# Patient Record
Sex: Female | Born: 1945 | Race: Asian | Hispanic: No | Marital: Married | Smoking: Never smoker
Health system: Southern US, Community
[De-identification: ages and names within clinical notes are randomized; demographics above are authoritative.]

## PROBLEM LIST (undated history)

## (undated) DIAGNOSIS — E876 Hypokalemia: Secondary | ICD-10-CM

## (undated) DIAGNOSIS — I351 Nonrheumatic aortic (valve) insufficiency: Secondary | ICD-10-CM

## (undated) DIAGNOSIS — I10 Essential (primary) hypertension: Secondary | ICD-10-CM

## (undated) DIAGNOSIS — E785 Hyperlipidemia, unspecified: Secondary | ICD-10-CM

## (undated) DIAGNOSIS — I272 Pulmonary hypertension, unspecified: Secondary | ICD-10-CM

## (undated) DIAGNOSIS — I340 Nonrheumatic mitral (valve) insufficiency: Secondary | ICD-10-CM

## (undated) DIAGNOSIS — R112 Nausea with vomiting, unspecified: Secondary | ICD-10-CM

## (undated) DIAGNOSIS — R197 Diarrhea, unspecified: Secondary | ICD-10-CM

## (undated) DIAGNOSIS — R55 Syncope and collapse: Secondary | ICD-10-CM

## (undated) DIAGNOSIS — I4891 Unspecified atrial fibrillation: Secondary | ICD-10-CM

## (undated) HISTORY — DX: Unspecified atrial fibrillation: I48.91

## (undated) HISTORY — DX: Pulmonary hypertension, unspecified: I27.20

## (undated) HISTORY — DX: Syncope and collapse: R55

## (undated) HISTORY — DX: Nonrheumatic mitral (valve) insufficiency: I34.0

## (undated) HISTORY — DX: Essential (primary) hypertension: I10

## (undated) HISTORY — DX: Nausea with vomiting, unspecified: R11.2

## (undated) HISTORY — DX: Nonrheumatic aortic (valve) insufficiency: I35.1

## (undated) HISTORY — DX: Diarrhea, unspecified: R19.7

## (undated) HISTORY — DX: Hypokalemia: E87.6

---

## 2002-01-22 ENCOUNTER — Other Ambulatory Visit: Admission: RE | Admit: 2002-01-22 | Discharge: 2002-01-22 | Payer: Self-pay | Admitting: Obstetrics and Gynecology

## 2011-07-15 DIAGNOSIS — J45909 Unspecified asthma, uncomplicated: Secondary | ICD-10-CM | POA: Diagnosis not present

## 2011-07-15 DIAGNOSIS — I10 Essential (primary) hypertension: Secondary | ICD-10-CM | POA: Diagnosis not present

## 2011-07-15 DIAGNOSIS — R7309 Other abnormal glucose: Secondary | ICD-10-CM | POA: Diagnosis not present

## 2011-07-15 DIAGNOSIS — E7800 Pure hypercholesterolemia, unspecified: Secondary | ICD-10-CM | POA: Diagnosis not present

## 2012-01-06 DIAGNOSIS — Z1231 Encounter for screening mammogram for malignant neoplasm of breast: Secondary | ICD-10-CM | POA: Diagnosis not present

## 2012-01-20 DIAGNOSIS — R928 Other abnormal and inconclusive findings on diagnostic imaging of breast: Secondary | ICD-10-CM | POA: Diagnosis not present

## 2012-02-24 DIAGNOSIS — I10 Essential (primary) hypertension: Secondary | ICD-10-CM | POA: Diagnosis not present

## 2012-02-24 DIAGNOSIS — E785 Hyperlipidemia, unspecified: Secondary | ICD-10-CM | POA: Diagnosis not present

## 2012-02-24 DIAGNOSIS — N390 Urinary tract infection, site not specified: Secondary | ICD-10-CM | POA: Diagnosis not present

## 2012-02-24 DIAGNOSIS — R7301 Impaired fasting glucose: Secondary | ICD-10-CM | POA: Diagnosis not present

## 2012-03-04 DIAGNOSIS — Z23 Encounter for immunization: Secondary | ICD-10-CM | POA: Diagnosis not present

## 2012-03-04 DIAGNOSIS — J45909 Unspecified asthma, uncomplicated: Secondary | ICD-10-CM | POA: Diagnosis not present

## 2012-03-04 DIAGNOSIS — M5430 Sciatica, unspecified side: Secondary | ICD-10-CM | POA: Diagnosis not present

## 2012-03-04 DIAGNOSIS — E785 Hyperlipidemia, unspecified: Secondary | ICD-10-CM | POA: Diagnosis not present

## 2012-03-04 DIAGNOSIS — R7301 Impaired fasting glucose: Secondary | ICD-10-CM | POA: Diagnosis not present

## 2012-03-04 DIAGNOSIS — I10 Essential (primary) hypertension: Secondary | ICD-10-CM | POA: Diagnosis not present

## 2012-05-04 DIAGNOSIS — J069 Acute upper respiratory infection, unspecified: Secondary | ICD-10-CM | POA: Diagnosis not present

## 2012-06-19 DIAGNOSIS — E7800 Pure hypercholesterolemia, unspecified: Secondary | ICD-10-CM | POA: Diagnosis not present

## 2012-06-19 DIAGNOSIS — R7301 Impaired fasting glucose: Secondary | ICD-10-CM | POA: Diagnosis not present

## 2012-06-19 DIAGNOSIS — I10 Essential (primary) hypertension: Secondary | ICD-10-CM | POA: Diagnosis not present

## 2012-06-28 DIAGNOSIS — E785 Hyperlipidemia, unspecified: Secondary | ICD-10-CM | POA: Diagnosis not present

## 2012-06-28 DIAGNOSIS — I10 Essential (primary) hypertension: Secondary | ICD-10-CM | POA: Diagnosis not present

## 2012-06-28 DIAGNOSIS — J45909 Unspecified asthma, uncomplicated: Secondary | ICD-10-CM | POA: Diagnosis not present

## 2012-06-28 DIAGNOSIS — M5430 Sciatica, unspecified side: Secondary | ICD-10-CM | POA: Diagnosis not present

## 2012-10-05 DIAGNOSIS — E7800 Pure hypercholesterolemia, unspecified: Secondary | ICD-10-CM | POA: Diagnosis not present

## 2012-10-05 DIAGNOSIS — E119 Type 2 diabetes mellitus without complications: Secondary | ICD-10-CM | POA: Diagnosis not present

## 2012-10-05 DIAGNOSIS — I10 Essential (primary) hypertension: Secondary | ICD-10-CM | POA: Diagnosis not present

## 2012-10-15 DIAGNOSIS — M25569 Pain in unspecified knee: Secondary | ICD-10-CM | POA: Diagnosis not present

## 2012-10-15 DIAGNOSIS — M1710 Unilateral primary osteoarthritis, unspecified knee: Secondary | ICD-10-CM | POA: Diagnosis not present

## 2012-10-15 DIAGNOSIS — M5430 Sciatica, unspecified side: Secondary | ICD-10-CM | POA: Diagnosis not present

## 2012-10-15 DIAGNOSIS — J45909 Unspecified asthma, uncomplicated: Secondary | ICD-10-CM | POA: Diagnosis not present

## 2012-10-15 DIAGNOSIS — I10 Essential (primary) hypertension: Secondary | ICD-10-CM | POA: Diagnosis not present

## 2012-10-15 DIAGNOSIS — E785 Hyperlipidemia, unspecified: Secondary | ICD-10-CM | POA: Diagnosis not present

## 2012-10-15 DIAGNOSIS — IMO0002 Reserved for concepts with insufficient information to code with codable children: Secondary | ICD-10-CM | POA: Diagnosis not present

## 2012-12-28 DIAGNOSIS — H2510 Age-related nuclear cataract, unspecified eye: Secondary | ICD-10-CM | POA: Diagnosis not present

## 2013-02-12 DIAGNOSIS — Z23 Encounter for immunization: Secondary | ICD-10-CM | POA: Diagnosis not present

## 2013-07-25 DIAGNOSIS — R7301 Impaired fasting glucose: Secondary | ICD-10-CM | POA: Diagnosis not present

## 2013-07-25 DIAGNOSIS — M545 Low back pain, unspecified: Secondary | ICD-10-CM | POA: Diagnosis not present

## 2013-07-25 DIAGNOSIS — Z0000 Encounter for general adult medical examination without abnormal findings: Secondary | ICD-10-CM | POA: Diagnosis not present

## 2013-07-25 DIAGNOSIS — I10 Essential (primary) hypertension: Secondary | ICD-10-CM | POA: Diagnosis not present

## 2013-07-25 DIAGNOSIS — E785 Hyperlipidemia, unspecified: Secondary | ICD-10-CM | POA: Diagnosis not present

## 2013-07-25 DIAGNOSIS — Z23 Encounter for immunization: Secondary | ICD-10-CM | POA: Diagnosis not present

## 2013-07-25 DIAGNOSIS — J45909 Unspecified asthma, uncomplicated: Secondary | ICD-10-CM | POA: Diagnosis not present

## 2013-08-14 ENCOUNTER — Other Ambulatory Visit: Payer: Self-pay

## 2013-08-14 DIAGNOSIS — Z1231 Encounter for screening mammogram for malignant neoplasm of breast: Secondary | ICD-10-CM

## 2013-09-02 ENCOUNTER — Other Ambulatory Visit (HOSPITAL_COMMUNITY)
Admission: RE | Admit: 2013-09-02 | Discharge: 2013-09-02 | Disposition: A | Discharge status: Home / Self Care | Payer: Medicare Other | Source: Ambulatory Visit | Attending: Obstetrics and Gynecology | Admitting: Obstetrics and Gynecology

## 2013-09-02 ENCOUNTER — Other Ambulatory Visit: Payer: Self-pay | Admitting: Nurse Practitioner

## 2013-09-02 DIAGNOSIS — Z124 Encounter for screening for malignant neoplasm of cervix: Secondary | ICD-10-CM | POA: Insufficient documentation

## 2013-09-02 DIAGNOSIS — Z01419 Encounter for gynecological examination (general) (routine) without abnormal findings: Secondary | ICD-10-CM | POA: Diagnosis not present

## 2013-09-07 DIAGNOSIS — Z124 Encounter for screening for malignant neoplasm of cervix: Secondary | ICD-10-CM | POA: Diagnosis not present

## 2013-09-22 ENCOUNTER — Ambulatory Visit
Admission: RE | Admit: 2013-09-22 | Discharge: 2013-09-22 | Disposition: A | Discharge status: Home / Self Care | Payer: Medicare Other | Source: Ambulatory Visit

## 2013-09-22 ENCOUNTER — Encounter (INDEPENDENT_AMBULATORY_CARE_PROVIDER_SITE_OTHER): Payer: Self-pay

## 2013-09-22 DIAGNOSIS — Z1231 Encounter for screening mammogram for malignant neoplasm of breast: Secondary | ICD-10-CM | POA: Diagnosis not present

## 2013-11-04 DIAGNOSIS — E785 Hyperlipidemia, unspecified: Secondary | ICD-10-CM | POA: Diagnosis not present

## 2013-11-04 DIAGNOSIS — J45909 Unspecified asthma, uncomplicated: Secondary | ICD-10-CM | POA: Diagnosis not present

## 2013-11-04 DIAGNOSIS — R7309 Other abnormal glucose: Secondary | ICD-10-CM | POA: Diagnosis not present

## 2013-11-04 DIAGNOSIS — Z1211 Encounter for screening for malignant neoplasm of colon: Secondary | ICD-10-CM | POA: Diagnosis not present

## 2013-11-04 DIAGNOSIS — Z23 Encounter for immunization: Secondary | ICD-10-CM | POA: Diagnosis not present

## 2013-11-04 DIAGNOSIS — I10 Essential (primary) hypertension: Secondary | ICD-10-CM | POA: Diagnosis not present

## 2013-12-01 DIAGNOSIS — Z1211 Encounter for screening for malignant neoplasm of colon: Secondary | ICD-10-CM | POA: Diagnosis not present

## 2014-02-14 DIAGNOSIS — I10 Essential (primary) hypertension: Secondary | ICD-10-CM | POA: Diagnosis not present

## 2014-02-14 DIAGNOSIS — R000 Tachycardia, unspecified: Secondary | ICD-10-CM | POA: Diagnosis not present

## 2014-04-03 DIAGNOSIS — R7301 Impaired fasting glucose: Secondary | ICD-10-CM | POA: Diagnosis not present

## 2014-04-03 DIAGNOSIS — Z23 Encounter for immunization: Secondary | ICD-10-CM | POA: Diagnosis not present

## 2014-04-03 DIAGNOSIS — J45909 Unspecified asthma, uncomplicated: Secondary | ICD-10-CM | POA: Diagnosis not present

## 2014-04-03 DIAGNOSIS — I10 Essential (primary) hypertension: Secondary | ICD-10-CM | POA: Diagnosis not present

## 2014-04-03 DIAGNOSIS — E785 Hyperlipidemia, unspecified: Secondary | ICD-10-CM | POA: Diagnosis not present

## 2014-04-17 DIAGNOSIS — I10 Essential (primary) hypertension: Secondary | ICD-10-CM | POA: Diagnosis not present

## 2014-07-03 DIAGNOSIS — M179 Osteoarthritis of knee, unspecified: Secondary | ICD-10-CM | POA: Diagnosis not present

## 2014-07-03 DIAGNOSIS — I10 Essential (primary) hypertension: Secondary | ICD-10-CM | POA: Diagnosis not present

## 2016-12-08 ENCOUNTER — Other Ambulatory Visit: Payer: Self-pay | Admitting: Family Medicine

## 2016-12-08 DIAGNOSIS — R740 Nonspecific elevation of levels of transaminase and lactic acid dehydrogenase [LDH]: Principal | ICD-10-CM

## 2016-12-08 DIAGNOSIS — R7401 Elevation of levels of liver transaminase levels: Secondary | ICD-10-CM

## 2016-12-20 ENCOUNTER — Ambulatory Visit
Admission: RE | Admit: 2016-12-20 | Discharge: 2016-12-20 | Disposition: A | Discharge status: Home / Self Care | Payer: Medicare Other | Source: Ambulatory Visit | Attending: Family Medicine | Admitting: Family Medicine

## 2016-12-20 DIAGNOSIS — R740 Nonspecific elevation of levels of transaminase and lactic acid dehydrogenase [LDH]: Principal | ICD-10-CM

## 2016-12-20 DIAGNOSIS — R7401 Elevation of levels of liver transaminase levels: Secondary | ICD-10-CM

## 2017-07-11 ENCOUNTER — Emergency Department (HOSPITAL_COMMUNITY): Payer: Medicare Other

## 2017-07-11 ENCOUNTER — Other Ambulatory Visit: Payer: Self-pay

## 2017-07-11 ENCOUNTER — Observation Stay (HOSPITAL_COMMUNITY)
Admission: EM | Admit: 2017-07-11 | Discharge: 2017-07-12 | Disposition: A | Discharge status: Home / Self Care | Payer: Medicare Other | Attending: Internal Medicine | Admitting: Internal Medicine

## 2017-07-11 ENCOUNTER — Observation Stay (HOSPITAL_COMMUNITY): Payer: Medicare Other

## 2017-07-11 ENCOUNTER — Encounter (HOSPITAL_COMMUNITY): Payer: Self-pay | Admitting: *Deleted

## 2017-07-11 DIAGNOSIS — I471 Supraventricular tachycardia: Secondary | ICD-10-CM | POA: Diagnosis not present

## 2017-07-11 DIAGNOSIS — I4891 Unspecified atrial fibrillation: Secondary | ICD-10-CM

## 2017-07-11 DIAGNOSIS — E785 Hyperlipidemia, unspecified: Secondary | ICD-10-CM | POA: Insufficient documentation

## 2017-07-11 DIAGNOSIS — E876 Hypokalemia: Secondary | ICD-10-CM | POA: Diagnosis present

## 2017-07-11 DIAGNOSIS — I10 Essential (primary) hypertension: Secondary | ICD-10-CM | POA: Diagnosis not present

## 2017-07-11 DIAGNOSIS — I081 Rheumatic disorders of both mitral and tricuspid valves: Secondary | ICD-10-CM | POA: Diagnosis not present

## 2017-07-11 DIAGNOSIS — M6281 Muscle weakness (generalized): Secondary | ICD-10-CM | POA: Insufficient documentation

## 2017-07-11 DIAGNOSIS — Z7982 Long term (current) use of aspirin: Secondary | ICD-10-CM | POA: Insufficient documentation

## 2017-07-11 DIAGNOSIS — I480 Paroxysmal atrial fibrillation: Secondary | ICD-10-CM | POA: Diagnosis not present

## 2017-07-11 DIAGNOSIS — Z79899 Other long term (current) drug therapy: Secondary | ICD-10-CM | POA: Diagnosis not present

## 2017-07-11 DIAGNOSIS — D696 Thrombocytopenia, unspecified: Secondary | ICD-10-CM | POA: Insufficient documentation

## 2017-07-11 DIAGNOSIS — R112 Nausea with vomiting, unspecified: Secondary | ICD-10-CM | POA: Insufficient documentation

## 2017-07-11 DIAGNOSIS — R531 Weakness: Secondary | ICD-10-CM | POA: Diagnosis not present

## 2017-07-11 DIAGNOSIS — R197 Diarrhea, unspecified: Secondary | ICD-10-CM | POA: Diagnosis not present

## 2017-07-11 DIAGNOSIS — R7989 Other specified abnormal findings of blood chemistry: Secondary | ICD-10-CM | POA: Insufficient documentation

## 2017-07-11 DIAGNOSIS — R55 Syncope and collapse: Secondary | ICD-10-CM

## 2017-07-11 HISTORY — DX: Essential (primary) hypertension: I10

## 2017-07-11 HISTORY — DX: Syncope and collapse: R55

## 2017-07-11 HISTORY — DX: Hyperlipidemia, unspecified: E78.5

## 2017-07-11 HISTORY — DX: Unspecified atrial fibrillation: I48.91

## 2017-07-11 HISTORY — DX: Paroxysmal atrial fibrillation: I48.0

## 2017-07-11 HISTORY — DX: Nausea with vomiting, unspecified: R11.2

## 2017-07-11 HISTORY — DX: Hypokalemia: E87.6

## 2017-07-11 LAB — BASIC METABOLIC PANEL
Anion gap: 12 (ref 5–15)
Anion gap: 5 (ref 5–15)
BUN: 18 mg/dL (ref 6–20)
BUN: 9 mg/dL (ref 6–20)
CALCIUM: 9.2 mg/dL (ref 8.9–10.3)
CHLORIDE: 112 mmol/L — AB (ref 101–111)
CO2: 27 mmol/L (ref 22–32)
CO2: 26 mmol/L (ref 22–32)
CREATININE: 0.69 mg/dL (ref 0.44–1.00)
Calcium: 8.3 mg/dL — ABNORMAL LOW (ref 8.9–10.3)
Chloride: 100 mmol/L — ABNORMAL LOW (ref 101–111)
Creatinine, Ser: 0.63 mg/dL (ref 0.44–1.00)
GFR calc Af Amer: >60 mL/min (ref >60)
GFR calc non Af Amer: >60 mL/min (ref >60)
GLUCOSE: 168 mg/dL — AB (ref 65–99)
Glucose, Bld: 113 mg/dL — ABNORMAL HIGH (ref 65–99)
POTASSIUM: 3.6 mmol/L (ref 3.5–5.1)
Potassium: 2.7 mmol/L — CL (ref 3.5–5.1)
SODIUM: 143 mmol/L (ref 135–145)
Sodium: 139 mmol/L (ref 135–145)

## 2017-07-11 LAB — CBC
HCT: 43.1 % (ref 36.0–46.0)
HEMOGLOBIN: 14.4 g/dL (ref 12.0–15.0)
MCH: 30.7 pg (ref 26.0–34.0)
MCHC: 33.4 g/dL (ref 30.0–36.0)
MCV: 91.9 fL (ref 78.0–100.0)
PLATELETS: 165 10*3/uL (ref 150–400)
RBC: 4.69 MIL/uL (ref 3.87–5.11)
RDW: 13.7 % (ref 11.5–15.5)
WBC: 14.9 10*3/uL — ABNORMAL HIGH (ref 4.0–10.5)

## 2017-07-11 LAB — URINALYSIS, ROUTINE W REFLEX MICROSCOPIC
BILIRUBIN URINE: NEGATIVE
GLUCOSE, UA: NEGATIVE mg/dL
HGB URINE DIPSTICK: NEGATIVE
KETONES UR: 5 mg/dL — AB
Leukocytes, UA: NEGATIVE
Nitrite: NEGATIVE
PH: 8 (ref 5.0–8.0)
PROTEIN: NEGATIVE mg/dL
Specific Gravity, Urine: 1.012 (ref 1.005–1.030)

## 2017-07-11 LAB — TROPONIN I
Troponin I: 0.03 ng/mL (ref <0.03)
Troponin I: 0.03 ng/mL (ref <0.03)

## 2017-07-11 LAB — RAPID URINE DRUG SCREEN, HOSP PERFORMED
AMPHETAMINES: NOT DETECTED
BARBITURATES: NOT DETECTED
Benzodiazepines: NOT DETECTED
Cocaine: NOT DETECTED
Opiates: NOT DETECTED
TETRAHYDROCANNABINOL: NOT DETECTED

## 2017-07-11 LAB — I-STAT TROPONIN, ED: TROPONIN I, POC: 0 ng/mL (ref 0.00–0.08)

## 2017-07-11 LAB — PROTIME-INR
INR: 0.93
PROTHROMBIN TIME: 12.4 s (ref 11.4–15.2)

## 2017-07-11 LAB — ECHOCARDIOGRAM COMPLETE
Height: 57 in
Weight: 2208 oz

## 2017-07-11 LAB — ETHANOL: Alcohol, Ethyl (B): <10 mg/dL (ref <10)

## 2017-07-11 LAB — D-DIMER, QUANTITATIVE: D-Dimer, Quant: 0.47 ug/mL-FEU (ref 0.00–0.50)

## 2017-07-11 LAB — TSH: TSH: 3.341 u[IU]/mL (ref 0.350–4.500)

## 2017-07-11 LAB — MAGNESIUM: Magnesium: 1.8 mg/dL (ref 1.7–2.4)

## 2017-07-11 LAB — LIPASE, BLOOD: LIPASE: 35 U/L (ref 11–51)

## 2017-07-11 MED ORDER — MAGNESIUM SULFATE IN D5W 1-5 GM/100ML-% IV SOLN
1.0000 g | Freq: Once | INTRAVENOUS | Status: AC
  Administered 2017-07-11: 1 g via INTRAVENOUS
  Filled 2017-07-11: qty 100

## 2017-07-11 MED ORDER — ACETAMINOPHEN 650 MG RE SUPP: 650.0000 mg | Freq: Four times a day (QID) | RECTAL | Status: DC | PRN

## 2017-07-11 MED ORDER — POLYVINYL ALCOHOL 1.4 % OP SOLN
1.0000 [drp] | Freq: Three times a day (TID) | OPHTHALMIC | Status: DC | PRN
  Filled 2017-07-11: qty 15

## 2017-07-11 MED ORDER — LORATADINE 10 MG PO TABS
10.0000 mg | ORAL_TABLET | Freq: Every day | ORAL | Status: DC
  Administered 2017-07-11 – 2017-07-12 (×2): 10 mg via ORAL
  Filled 2017-07-11 (×2): qty 1

## 2017-07-11 MED ORDER — POTASSIUM CHLORIDE 10 MEQ/100ML IV SOLN
10.0000 meq | Freq: Once | INTRAVENOUS | Status: AC
  Administered 2017-07-11: 10 meq via INTRAVENOUS
  Filled 2017-07-11: qty 100

## 2017-07-11 MED ORDER — ONDANSETRON HCL 4 MG/2ML IJ SOLN
4.0000 mg | Freq: Once | INTRAMUSCULAR | Status: AC | PRN
  Administered 2017-07-11: 4 mg via INTRAVENOUS
  Filled 2017-07-11: qty 2

## 2017-07-11 MED ORDER — VITAMIN B-1 100 MG PO TABS
50.0000 mg | ORAL_TABLET | Freq: Every day | ORAL | Status: DC
  Administered 2017-07-11 – 2017-07-12 (×2): 50 mg via ORAL
  Filled 2017-07-11 (×2): qty 1

## 2017-07-11 MED ORDER — ENOXAPARIN SODIUM 40 MG/0.4ML ~~LOC~~ SOLN
40.0000 mg | SUBCUTANEOUS | Status: DC
  Administered 2017-07-11: 40 mg via SUBCUTANEOUS
  Filled 2017-07-11 (×2): qty 0.4

## 2017-07-11 MED ORDER — DILTIAZEM HCL ER COATED BEADS 240 MG PO CP24
240.0000 mg | ORAL_CAPSULE | Freq: Every day | ORAL | Status: DC
  Administered 2017-07-11 – 2017-07-12 (×2): 240 mg via ORAL
  Filled 2017-07-11 (×2): qty 1

## 2017-07-11 MED ORDER — ASPIRIN EC 81 MG PO TBEC
81.0000 mg | DELAYED_RELEASE_TABLET | Freq: Every day | ORAL | Status: DC
  Administered 2017-07-11 – 2017-07-12 (×2): 81 mg via ORAL
  Filled 2017-07-11 (×2): qty 1

## 2017-07-11 MED ORDER — PRAVASTATIN SODIUM 20 MG PO TABS
20.0000 mg | ORAL_TABLET | Freq: Every day | ORAL | Status: DC
  Administered 2017-07-11 – 2017-07-12 (×2): 20 mg via ORAL
  Filled 2017-07-11 (×2): qty 1

## 2017-07-11 MED ORDER — ONDANSETRON HCL 4 MG PO TABS
4.0000 mg | ORAL_TABLET | Freq: Four times a day (QID) | ORAL | Status: DC | PRN
Start: 2017-07-11 — End: 2017-07-12

## 2017-07-11 MED ORDER — ONDANSETRON HCL 4 MG/2ML IJ SOLN: 4.0000 mg | Freq: Four times a day (QID) | INTRAMUSCULAR | Status: DC | PRN

## 2017-07-11 MED ORDER — POTASSIUM CHLORIDE 10 MEQ/100ML IV SOLN
10.0000 meq | INTRAVENOUS | Status: AC
  Administered 2017-07-11 (×3): 10 meq via INTRAVENOUS
  Filled 2017-07-11 (×3): qty 100

## 2017-07-11 MED ORDER — DILTIAZEM HCL 60 MG PO TABS
120.0000 mg | ORAL_TABLET | Freq: Once | ORAL | Status: AC
  Administered 2017-07-11: 120 mg via ORAL
  Filled 2017-07-11: qty 2

## 2017-07-11 MED ORDER — POTASSIUM CHLORIDE CRYS ER 20 MEQ PO TBCR
40.0000 meq | EXTENDED_RELEASE_TABLET | Freq: Once | ORAL | Status: AC
  Administered 2017-07-11: 40 meq via ORAL
  Filled 2017-07-11: qty 2

## 2017-07-11 MED ORDER — ALBUTEROL SULFATE (2.5 MG/3ML) 0.083% IN NEBU: 2.5000 mg | INHALATION_SOLUTION | Freq: Four times a day (QID) | RESPIRATORY_TRACT | Status: DC | PRN

## 2017-07-11 MED ORDER — ACETAMINOPHEN 325 MG PO TABS: 650.0000 mg | ORAL_TABLET | Freq: Four times a day (QID) | ORAL | Status: DC | PRN

## 2017-07-11 NOTE — ED Notes (Signed)
ED Provider at bedside. 

## 2017-07-11 NOTE — ED Notes (Signed)
Patient will be transported upstairs once ECHO is complete.

## 2017-07-11 NOTE — ED Notes (Signed)
Report given to Abby,RN [phone].

## 2017-07-11 NOTE — H&P (Signed)
History and Physical    Rita Rodona Besancon WUJ:[id number] DOB: Nov 06, 1945 DOA: 07/11/2017  Referring MD/NP/PA: Dr. Zadie Rhineonald Wickline PCP: Darrow BussingKoirala, Dibas, MD  Patient coming from: home  Chief Complaint: Syncopal episode  I have personally briefly reviewed patient's old medical records in [facility]   HPI: Rita Myers is a 72 y.o. female with medical history significant of HTN, HLD, and reported history of irregular heartbeat on aspirin; who presents after having syncopal episode at home.  Patient husband witnessed the event and notes that she had passed out, but he is unsure if she hit her head.  He reports that she only lost consciousness for a few seconds.  She gone out to eat at a restaurant and late evening around 9:30 to 10 PM last night had acute onset of nausea, multiple episodes of vomiting, and large loose stool.  Thereafter patient reported feeling dizzy as though the room was spinning around her and very weak just prior to having the syncopal episode.  Denies having any chest pain, recent travel, calf pain, cough, history of TIA/stroke,.  Associated symptoms include palpitations and intermittent shortness of breath.  She reports history of having a irregular heart rhythm for which she takes a daily aspirin, and had a echocardiogram done over a year ago..    ED Course: Upon admission into the emergency department patient was found to be afebrile, pulse 62-147, respirations 18, blood pressure 110/95-160 7/92, O2 saturations 100% on room air.  Labs revealed WBC 14.9, potassium 2.7, and initial troponin 0.  CT scan of the brain and chest x-ray was negative for any acute abnormalities.  Urine drug screen and alcohol levels were unremarkable.  Urinalysis was negative for any acute abnormalities.She was given 10 mg of potassium chloride IV and 40 mEq p.o EKG revealed patient in atrial fibrillation with intermittent RVR.  Later given 125 mg of Cardizem p.o.  TRH called to admit.    Review of Systems   Constitutional: Negative for chills, diaphoresis and fever.  HENT: Negative for ear discharge and nosebleeds.   Eyes: Negative for photophobia and discharge.  Respiratory: Positive for shortness of breath. Negative for cough and wheezing.   Cardiovascular: Positive for palpitations. Negative for chest pain and leg swelling.  Gastrointestinal: Positive for diarrhea, nausea and vomiting. Negative for abdominal pain.  Genitourinary: Negative for dysuria and hematuria.  Musculoskeletal: Positive for falls.  Skin: Negative for itching and rash.  Neurological: Positive for dizziness, loss of consciousness and weakness.  Psychiatric/Behavioral: Negative for substance abuse and suicidal ideas.    Past Medical History:  Diagnosis Date  . Hyperlipemia   . Hypertension     History reviewed. No pertinent surgical history.   reports that  has never smoked. she has never used smokeless tobacco. She reports that she does not drink alcohol or use drugs.  No Known Allergies  No known family history of blood clotting disorder  Prior to Admission medications   Medication Sig Start Date End Date Taking? Authorizing Provider  aspirin EC 81 MG tablet Take 81 mg by mouth daily.   Yes [provider]  calcium citrate-vitamin D (CITRACAL+D) 315-200 MG-UNIT tablet Take 1 tablet by mouth 2 (two) times daily.   Yes [provider]  Cholecalciferol (VITAMIN D PO) Take 1 capsule by mouth daily.   Yes [provider]  diltiazem (DILACOR XR) 240 MG 24 hr capsule Take 240 mg by mouth daily.   Yes [provider]  hydrochlorothiazide (HYDRODIURIL) 25 MG tablet Take  25 mg by mouth daily.   Yes [provider]  hydroxypropyl methylcellulose / hypromellose (ISOPTO TEARS / GONIOVISC) 2.5 % ophthalmic solution Place 1 drop into both eyes 3 (three) times daily as needed for dry eyes.   Yes [provider]  loratadine (CLARITIN) 10 MG tablet Take 10 mg by mouth  daily.   Yes [provider]  Omega-3 Fatty Acids (FISH OIL) 1000 MG CAPS Take 1,000 mg by mouth daily.   Yes [provider]  pravastatin (PRAVACHOL) 20 MG tablet Take 20 mg by mouth daily.   Yes [provider]  thiamine (VITAMIN B-1) 50 MG tablet Take 50 mg by mouth daily.   Yes [provider]    Physical Exam:  Constitutional: Elderly female in NAD, calm, comfortable Vitals:   07/11/17 0313 07/11/17 0314 07/11/17 0315 07/11/17 0321  BP: 118/70 (!) 110/95 119/88   Pulse:   (!) 147   Resp:      Temp:    98.4 F (36.9 C)  TempSrc:      SpO2:      Weight:      Height:       Eyes: PERRL, lids and conjunctivae normal ENMT: Mucous membranes are moist. Posterior pharynx clear of any exudate or lesions.  Neck: normal, supple, no masses, no thyromegaly Respiratory: clear to auscultation bilaterally, no wheezing, no crackles. Normal respiratory effort. No accessory muscle use.  Cardiovascular: Irregular irregular no murmurs / rubs / gallops. No extremity edema. 2+ pedal pulses. No carotid bruits.  Abdomen: no tenderness, no masses palpated. No hepatosplenomegaly. Bowel sounds positive.  Musculoskeletal: no clubbing / cyanosis. No joint deformity upper and lower extremities. Good ROM, no contractures. Normal muscle tone.  Skin: no rashes, lesions, ulcers. No induration Neurologic: CN 2-12 grossly intact. Sensation intact, DTR normal. Strength 5/5 in all 4.  Psychiatric: Normal judgment and insight. Alert and oriented x 3. Normal mood.     Labs on Admission: I have personally reviewed following labs and imaging studies  CBC: Recent Labs  Lab 07/11/17 0133  WBC 14.9*  HGB 14.4  HCT 43.1  MCV 91.9  PLT 165   Basic Metabolic Panel: Recent Labs  Lab 07/11/17 0133  NA 139  K 2.7*  CL 100*  CO2 27  GLUCOSE 168*  BUN 18  CREATININE 0.69  CALCIUM 9.2   GFR: Estimated Creatinine Clearance: 47.7 mL/min (by C-G formula based on SCr of 0.69  mg/dL). Liver Function Tests: No results for input(s): AST, ALT, ALKPHOS, BILITOT, PROT, ALBUMIN in the last 168 hours. Recent Labs  Lab 07/11/17 0129  LIPASE 35   No results for input(s): AMMONIA in the last 168 hours. Coagulation Profile: Recent Labs  Lab 07/11/17 0154  INR 0.93   Cardiac Enzymes: No results for input(s): CKTOTAL, CKMB, CKMBINDEX, TROPONINI in the last 168 hours. BNP (last 3 results) No results for input(s): PROBNP in the last 8760 hours. HbA1C: No results for input(s): HGBA1C in the last 72 hours. CBG: No results for input(s): GLUCAP in the last 168 hours. Lipid Profile: No results for input(s): CHOL, HDL, LDLCALC, TRIG, CHOLHDL, LDLDIRECT in the last 72 hours. Thyroid Function Tests: No results for input(s): TSH, T4TOTAL, FREET4, T3FREE, THYROIDAB in the last 72 hours. Anemia Panel: No results for input(s): VITAMINB12, FOLATE, FERRITIN, TIBC, IRON, RETICCTPCT in the last 72 hours. Urine analysis: No results found for: COLORURINE, APPEARANCEUR, LABSPEC, PHURINE, GLUCOSEU, HGBUR, BILIRUBINUR, KETONESUR, PROTEINUR, UROBILINOGEN, NITRITE, LEUKOCYTESUR Sepsis Labs: No results found for  this or any previous visit (from the past 240 hour(s)).   Radiological Exams on Admission: Dg Chest 2 View  Result Date: 07/11/2017 CLINICAL DATA:  71 year old female with weakness. EXAM: CHEST  2 VIEW COMPARISON:  None. FINDINGS: Minimal bibasilar dependent atelectatic changes. No focal consolidation, pleural effusion, or pneumothorax. Mild cardiomegaly. Degenerative changes of the spine. No acute osseous pathology. IMPRESSION: No active cardiopulmonary disease. Electronically Signed   By: Elgie Collard M.D.   On: 07/11/2017 02:14   Ct Head Wo Contrast  Result Date: 07/11/2017 CLINICAL DATA:  Altered level of consciousness.  Weakness. EXAM: CT HEAD WITHOUT CONTRAST TECHNIQUE: Contiguous axial images were obtained from the base of the skull through the vertex without  intravenous contrast. COMPARISON:  None. FINDINGS: Brain: No acute intracranial abnormality. Specifically, no hemorrhage, hydrocephalus, mass lesion, acute infarction, or significant intracranial injury. Vascular: No hyperdense vessel or unexpected calcification. Skull: No acute calvarial abnormality. Sinuses/Orbits: Visualized paranasal sinuses and mastoids clear. Orbital soft tissues unremarkable. Other: None IMPRESSION: No acute intracranial abnormality. Electronically Signed   By: Charlett Nose M.D.   On: 07/11/2017 02:20    EKG: Independently reviewed.  Atrial fibrillation at a rate of 124 bpm  Assessment/Plan Atrial fibrillation: Acute.  Family makes it appear that patient has known irregular heart rhythm.  Heart rates into the 140s on admission.  Currently only taking a baby aspirin daily.  ChadsVasc = 3(age, sex, HTN).  - Admit to a telemetry bed/stepdown bed - Check TSH and magnesium level - Correcting electrolyte abnormalities - Check echocardiogram in a.m.   - Continue home Cardizem and aspirin - May want to discuss with patient's PCP in a.m. as no records available - May warrant consult to cardiology in am     Syncope:acute.  Patient with nausea vomiting and diarrhea prior to symptoms.  Suspect possible vasovagal cause of symptoms.  Acute pulmonary embolus appears less likely. - Check d-dimer  - Follow-up echocardiogram   Nausea, vomiting, diarrhea: Acute.  Patient reported eating in a restaurant prior to acute onset of symptoms.  Suspect possibly food poisoning/ gastroenteritis. - Monitor intake and output - Continue symptomatic treatment  Hypokalemia: Acute.  Patient's initial potassium noted to be 2.7 on admission.  Given 10 mEq of potassium chloride IV and 40 mEq po . - Give additional potassium chloride 30 mEq IV - Continue to monitor and replace as needed  Essential hypertension - Continue Cardizem - Restart hydrochlorothiazide when medically appropriate  DVT  prophylaxis: Lovenox Code Status: full Family Communication: Discussed plan of care with patient and family present at bedside Disposition Plan: Likely discharge home in 1-2 days. Consults called: none Admission status: Observation  Clydie Braun MD [facility] Pager [phone]   If 7PM-7AM, please contact night-coverage [email or web address] Password [facility]  07/11/2017, 3:45 AM

## 2017-07-11 NOTE — Consult Note (Addendum)
The patient has been seen in conjunction with Rita BonJanine Hammond, NP. All aspects of care have been considered and discussed. The patient has been personally interviewed, examined, and all clinical data has been reviewed.   New onset atrial fibrillation identified in setting where there was also nausea, vomiting, and a syncopal episode during vomiting.  Syncope is likely related to the nausea and vomiting although cannot exclude the possibility of tachybradycardia syndrome given the presence of atrial fibrillation.  Prior history of palpitations although no documentation of atrial fibrillation.  CHADS2 VA2SC = 3. Needs long term anticoagulation with Apixiban.  Exam is unremarkable.  The patient is now in normal sinus rhythm.  All EKGs demonstrate atrial fibrillation rapid ventricular response.  Workup should include outpatient 30-day monitor.  Will need to have a TSH and 2D Doppler echocardiogram performed.  ECG in sinus rhythm should be performed.  Replete potassium.  Cycle troponins.  Elevated troponin is likely not representative of acute coronary syndrome.  Cardiology Consultation:   Patient ID: Rita Myers; [id number]; 1946-03-11   Admit date: 07/11/2017 Date of Consult: 07/11/2017  Primary Care Provider: Darrow BussingKoirala, Dibas, MD Primary Cardiologist: No primary care provider on file.   Patient Profile:   Rita Myers is a 72 y.o. female with a hx of HTN, HLD and reported irregular heartbeat on aspirin who is being seen today for the evaluation of atrial fibrillation at the request of Dr. Marland McalpineSheikh.  History of Present Illness:   Rita Myers [facility] after having syncopal episode at home. Her husband witnessed the event. They had gone out to eat at a restaurant and late in the evening last night she developed acute onset of nausea with multiple episodes of vomiting and large loose stool. After that she felt dizzy as though the room was spinning and felt very weak and then  passed out. The husband reported taht she only lost consciousness for a few seconds and did not know if she hit her head.   EKG revealed atrial fibrillation with intermittent RVR. Potassium was 2.7 and WBC 14.9.  Troponins negative X 2. CT of the brain and CXR were unremarkable. UDS and alcohol levels were normal. She was given 10mEq of potassium IV and 40 mEq orally. She was also given Cardizem 120 mg orally and then later cardizem CD 240 mg.   The patient is alert and pleasant and currently feels much better. She denies any previous cardiac history. She complained of an episode of fast heart beat one time in the past. Her PCP prescribed a prn med to take if needed but she does not know the name. She never had to use it. She and her husband had takeout food yesterday about 4 pm. Later in the evening she began to feel bad with nausea and dizziness. Soon after she began to vomit and had loose stool. She decided to go to the hospital. As she was dressing and bent down to pull up her pants she became very dizzy and fell to the floor. Her husband came right away and she was asking "did I fall?". He thinks she lost consciousness for only a few seconds. She notes that she felt her heart beat hard and fast with the vomiting. Once her in the ED, while she was in afib she had no awareness of palpitations or fast heart beat.   She denies any chest discomfort or shortness of breath through the evening. Prior to this she has had no exertional chest  discomfort, shortness of breath, palpitations, syncope/nearsyncope, orthopnea, PND or edema. She does not know of any family history of CAD. She has never smoked and does not drink alcohol. She has no history of bleeding issues.   Past Medical History:  Diagnosis Date  . Hyperlipemia   . Hypertension     History reviewed. No pertinent surgical history.   Home Medications:  Prior to Admission medications   Medication Sig Start Date End Date Taking? Authorizing  Provider  aspirin EC 81 MG tablet Take 81 mg by mouth daily.   Yes [provider]  calcium citrate-vitamin D (CITRACAL+D) 315-200 MG-UNIT tablet Take 1 tablet by mouth 2 (two) times daily.   Yes [provider]  Cholecalciferol (VITAMIN D PO) Take 1 capsule by mouth daily.   Yes [provider]  diltiazem (DILACOR XR) 240 MG 24 hr capsule Take 240 mg by mouth daily.   Yes [provider]  hydrochlorothiazide (HYDRODIURIL) 25 MG tablet Take 25 mg by mouth daily.   Yes [provider]  hydroxypropyl methylcellulose / hypromellose (ISOPTO TEARS / GONIOVISC) 2.5 % ophthalmic solution Place 1 drop into both eyes 3 (three) times daily as needed for dry eyes.   Yes [provider]  loratadine (CLARITIN) 10 MG tablet Take 10 mg by mouth daily.   Yes [provider]  Omega-3 Fatty Acids (FISH OIL) 1000 MG CAPS Take 1,000 mg by mouth daily.   Yes [provider]  pravastatin (PRAVACHOL) 20 MG tablet Take 20 mg by mouth daily.   Yes [provider]  thiamine (VITAMIN B-1) 50 MG tablet Take 50 mg by mouth daily.   Yes [provider]    Inpatient Medications: Scheduled Meds: . aspirin EC  81 mg Oral Daily  . diltiazem  240 mg Oral Daily  . enoxaparin (LOVENOX) injection  40 mg Subcutaneous Q24H  . loratadine  10 mg Oral Daily  . pravastatin  20 mg Oral Daily  . thiamine  50 mg Oral Daily   Continuous Infusions:  PRN Meds: acetaminophen **OR** acetaminophen, albuterol, ondansetron **OR** ondansetron (ZOFRAN) IV, polyvinyl alcohol  Allergies:   No Known Allergies  Social History:   Social History   Socioeconomic History  . Marital status: Married    Spouse name: Not on file  . Number of children: Not on file  . Years of education: Not on file  . Highest education level: Not on file  Social Needs  . Financial resource strain: Not on file  . Food insecurity - worry: Not on file  . Food insecurity -  inability: Not on file  . Transportation needs - medical: Not on file  . Transportation needs - non-medical: Not on file  Occupational History  . Not on file  Tobacco Use  . Smoking status: Never Smoker  . Smokeless tobacco: Never Used  Substance and Sexual Activity  . Alcohol use: No    Frequency: Never  . Drug use: No  . Sexual activity: Not on file  Other Topics Concern  . Not on file  Social History Narrative  . Not on file    Family History:    Family History  Problem Relation Age of Onset  . Diabetes Mother   . Diabetes Sister   . Diabetes Brother      ROS:  Please see the history of present illness.   All other ROS reviewed and negative.     Physical Exam/Data:   Vitals:   07/11/17 0930  07/11/17 1000 07/11/17 1030 07/11/17 1202  BP: 106/63 118/67 (!) 143/72 (!) 141/71  Pulse: 77 85 86 91  Resp: 13 (!) 21 17 13   Temp:      TempSrc:      SpO2: 100% 100% 98% 100%  Weight:      Height:        Intake/Output Summary (Last 24 hours) at 07/11/2017 1237 Last data filed at 07/11/2017 0934 Gross per 24 hour  Intake 400 ml  Output -  Net 400 ml   Filed Weights   07/11/17 0109  Weight: 138 lb (62.6 kg)   Body mass index is 29.86 kg/m.  General:  Well nourished, well developed, in no acute distress HEENT: normal Lymph: no adenopathy Neck: no JVD Endocrine:  No thryomegaly Vascular: No carotid bruits; FA pulses 2+ bilaterally without bruits  Cardiac:  normal S1, S2; RRR; no murmur  Lungs:  clear to auscultation bilaterally, no wheezing, rhonchi or rales  Abd: soft, nontender, no hepatomegaly  Ext: no edema Musculoskeletal:  No deformities, BUE and BLE strength normal and equal Skin: warm and dry  Neuro:  CNs 2-12 intact, no focal abnormalities noted Psych:  Normal affect   EKG:  The EKG was personally reviewed and demonstrates:  Atrial fibrillation 127-131 bpm with occ PVC Telemetry:  Telemetry was personally reviewed and demonstrates:  Sinus rhythm in  the 70's-80's  Relevant CV Studies:  Echo pending  Laboratory Data:  Chemistry Recent Labs  Lab 07/11/17 0133  NA 139  K 2.7*  CL 100*  CO2 27  GLUCOSE 168*  BUN 18  CREATININE 0.69  CALCIUM 9.2  GFRNONAA >60  GFRAA >60  ANIONGAP 12    No results for input(s): PROT, ALBUMIN, AST, ALT, ALKPHOS, BILITOT in the last 168 hours. Hematology Recent Labs  Lab 07/11/17 0133  WBC 14.9*  RBC 4.69  HGB 14.4  HCT 43.1  MCV 91.9  MCH 30.7  MCHC 33.4  RDW 13.7  PLT 165   Cardiac Enzymes Recent Labs  Lab 07/11/17 0505 07/11/17 1043  TROPONINI <0.03 0.03*    Recent Labs  Lab 07/11/17 0144  TROPIPOC 0.00    BNPNo results for input(s): BNP, PROBNP in the last 168 hours.  DDimer  Recent Labs  Lab 07/11/17 0505  DDIMER 0.47    Radiology/Studies:  Dg Chest 2 View  Result Date: 07/11/2017 CLINICAL DATA:  72 year old female with weakness. EXAM: CHEST  2 VIEW COMPARISON:  None. FINDINGS: Minimal bibasilar dependent atelectatic changes. No focal consolidation, pleural effusion, or pneumothorax. Mild cardiomegaly. Degenerative changes of the spine. No acute osseous pathology. IMPRESSION: No active cardiopulmonary disease. Electronically Signed   By: Elgie Collard M.D.   On: 07/11/2017 02:14   Ct Head Wo Contrast  Result Date: 07/11/2017 CLINICAL DATA:  Altered level of consciousness.  Weakness. EXAM: CT HEAD WITHOUT CONTRAST TECHNIQUE: Contiguous axial images were obtained from the base of the skull through the vertex without intravenous contrast. COMPARISON:  None. FINDINGS: Brain: No acute intracranial abnormality. Specifically, no hemorrhage, hydrocephalus, mass lesion, acute infarction, or significant intracranial injury. Vascular: No hyperdense vessel or unexpected calcification. Skull: No acute calvarial abnormality. Sinuses/Orbits: Visualized paranasal sinuses and mastoids clear. Orbital soft tissues unremarkable. Other: None IMPRESSION: No acute intracranial  abnormality. Electronically Signed   By: Charlett Nose M.D.   On: 07/11/2017 02:20    Assessment and Plan:   Paroxysmal Atrial fibrillation  -Pt with previous "fast heartbeat" one time in the past with no recurrence. On  aspirin and diltiazem 240 mg daily -Pt presented with afib RVR and syncope after nausea and vomiting, likely vasovagal. No evidence of myocardial ischemia.  -She had dizziness and fast, hard heart beat with the vomiting but was asymptomatic while here in afib. There is the possibility that she has had some episodes of undetected afib in the past.  -Her home cardizem was given and now back in sinus rhytm -Pt is hemodynamically stable.  -Continue her home diltiazem. Follow up in clinic. Possibly place 30 day monitor to evaluate for afib burden.  -CHA2DS2/VAS Stroke Risk Score is 3 (HTN, Age, Female). Pt is at increased risk for stroke related to afib. Recommend anticoagulation for stroke risk reduction. Apixaban 5 mg bid once determined no bleeding related to her syncope with fall. Ct head showed no bleeding.  -Echo pending  Hypertension -On cardizem 240 mg daily at home, resumed here -BP 167/92 on presentation. Currently well controlled. 106/63  Hyperlipidemia -On pravastatin 20 mg daily   For questions or updates, please contact [facility] Please consult [email or web address] for contact info under Cardiology/STEMI.   Signed, Lesleigh Noe, MD  07/11/2017 12:37 PM

## 2017-07-11 NOTE — ED Provider Notes (Signed)
[facility]-EMERGENCY DEPT Provider Note   CSN: [id number] Arrival date & time: 07/11/17  0055     History   Chief Complaint Chief Complaint  Patient presents with  . Dizziness  . Emesis  . Fall    HPI Aryella Besecker is a 72 y.o. female.  The history is provided by the patient and the spouse.  Dizziness  Severity:  Moderate Onset quality:  Sudden Timing:  Constant Progression:  Improving Chronicity:  New Relieved by:  Nothing Worsened by:  Nothing Associated symptoms: palpitations and vomiting   Emesis   Pertinent negatives include no fever.  Fall   Patient with history of hypertension presents with syncopal episode and vomiting.  She reports she was up to use the restroom when she felt lightheaded, and her husband reports that he was able to witness her fall.  He does not think she hit her head.  No seizures reported. The epiode was brief.  She reports just prior to this episode she was reported heartburn and did have vomiting No Recent episodes similar to this.  No history of CAD/CVA Past Medical History:  Diagnosis Date  . Hyperlipemia   . Hypertension     There are no active problems to display for this patient.   History reviewed. No pertinent surgical history.  OB History    No data available       Home Medications    Prior to Admission medications   Not on File    Family History No family history on file.  Social History Social History   Tobacco Use  . Smoking status: Never Smoker  . Smokeless tobacco: Never Used  Substance Use Topics  . Alcohol use: No    Frequency: Never  . Drug use: No     Allergies   Patient has no known allergies.   Review of Systems Review of Systems  Constitutional: Negative for fever.  Cardiovascular: Positive for palpitations.  Gastrointestinal: Positive for vomiting.  Neurological: Positive for dizziness.  All other systems reviewed and are negative.    Physical Exam Updated  Vital Signs BP (!) 167/92 (BP Location: Left Arm)   Pulse 62   Temp 97.7 F (36.5 C) (Oral)   Resp 18   Ht 1.448 m (4\' 9" )   Wt 62.6 kg (138 lb)   SpO2 100%   BMI 29.86 kg/m   Physical Exam CONSTITUTIONAL: Well developed/well nourished HEAD: Normocephalic/atraumatic, mild tenderness to left EYES: EOMI/PERRL ENMT: Mucous membranes moist NECK: supple no meningeal signs SPINE/BACK:entire spine nontender CV: S1/S2 noted, no murmurs/rubs/gallops noted LUNGS: Lungs are clear to auscultation bilaterally, no apparent distress ABDOMEN: soft, nontender, no rebound or guarding, bowel sounds noted throughout abdomen GU:no cva tenderness NEURO: Pt is awake/alert/appropriate, moves all extremitiesx4.  No facial droop.  No arm or leg drift eXTREMITIES: pulses normal/equal, full ROM SKIN: warm, color normal PSYCH: no abnormalities of mood noted, alert and oriented to situation   ED Treatments / Results  Labs (all labs ordered are listed, but only abnormal results are displayed) Labs Reviewed  URINALYSIS, ROUTINE W REFLEX MICROSCOPIC - Abnormal; Notable for the following components:      Result Value   APPearance HAZY (*)    Ketones, ur 5 (*)    All other components within normal limits  BASIC METABOLIC PANEL - Abnormal; Notable for the following components:   Potassium 2.7 (*)    Chloride 100 (*)    Glucose, Bld 168 (*)    All other  components within normal limits  CBC - Abnormal; Notable for the following components:   WBC 14.9 (*)    All other components within normal limits  LIPASE, BLOOD  ETHANOL  RAPID URINE DRUG SCREEN, HOSP PERFORMED  PROTIME-INR  I-STAT TROPONIN, ED    EKG ED ECG REPORT   Date: 07/11/2017 0121am  Rate: 127  Rhythm: atrial fibrillation  QRS Axis: normal  Intervals: QT prolonged  ST/T Wave abnormalities: nonspecific ST changes  Conduction Disutrbances:none  Narrative Interpretation:   Old EKG Reviewed: none available  I have personally reviewed  the EKG tracing and agree with the computerized printout as noted.  Radiology Dg Chest 2 View  Result Date: 07/11/2017 CLINICAL DATA:  72 year old female with weakness. EXAM: CHEST  2 VIEW COMPARISON:  None. FINDINGS: Minimal bibasilar dependent atelectatic changes. No focal consolidation, pleural effusion, or pneumothorax. Mild cardiomegaly. Degenerative changes of the spine. No acute osseous pathology. IMPRESSION: No active cardiopulmonary disease. Electronically Signed   By: Elgie CollardArash  Radparvar M.D.   On: 07/11/2017 02:14   Ct Head Wo Contrast  Result Date: 07/11/2017 CLINICAL DATA:  Altered level of consciousness.  Weakness. EXAM: CT HEAD WITHOUT CONTRAST TECHNIQUE: Contiguous axial images were obtained from the base of the skull through the vertex without intravenous contrast. COMPARISON:  None. FINDINGS: Brain: No acute intracranial abnormality. Specifically, no hemorrhage, hydrocephalus, mass lesion, acute infarction, or significant intracranial injury. Vascular: No hyperdense vessel or unexpected calcification. Skull: No acute calvarial abnormality. Sinuses/Orbits: Visualized paranasal sinuses and mastoids clear. Orbital soft tissues unremarkable. Other: None IMPRESSION: No acute intracranial abnormality. Electronically Signed   By: Charlett NoseKevin  Dover M.D.   On: 07/11/2017 02:20    Procedures Procedures  CRITICAL CARE Performed by: Joya Gaskinsonald W Treysen Sudbeck Total critical care time: 33 minutes Critical care time was exclusive of separately billable procedures and treating other patients. Critical care was necessary to treat or prevent imminent or life-threatening deterioration. Critical care was time spent personally by me on the following activities: development of treatment plan with patient and/or surrogate as well as nursing, discussions with consultants, evaluation of patient's response to treatment, examination of patient, obtaining history from patient or surrogate, ordering and performing  treatments and interventions, ordering and review of laboratory studies, ordering and review of radiographic studies, pulse oximetry and re-evaluation of patient's condition. Patient with atrial fibrillation with rapid ventricular response, patient with hypokalemia requiring IV potassium, patient requires admission  Medications Ordered in ED Medications  potassium chloride 10 mEq in 100 mL IVPB (10 mEq Intravenous New Bag/Given 07/11/17 0319)  diltiazem (CARDIZEM) tablet 120 mg (not administered)  ondansetron (ZOFRAN) injection 4 mg (4 mg Intravenous Given 07/11/17 0146)  potassium chloride SA (K-DUR,KLOR-CON) CR tablet 40 mEq (40 mEq Oral Given 07/11/17 0319)     Initial Impression / Assessment and Plan / ED Course  I have reviewed the triage vital signs and the nursing notes.  Pertinent labs & imaging results that were available during my care of the patient were reviewed by me and considered in my medical decision making (see chart for details).     This patients CHA2DS2-VASc Score and unadjusted Ischemic Stroke Rate (% per year) is equal to 3.2 % stroke rate/year from a score of 3  Above score calculated as 1 point each if present [CHF, HTN, DM, Vascular=MI/PAD/Aortic Plaque, Age if 65-74, or Female] Above score calculated as 2 points each if present [Age > 75, or Stroke/TIA/TE]    2:24 AM A brief episode of syncope.  She reports  mild dizziness, but no focal neuro deficits.  She appears to be atrial fibrillation, but unclear when this started.  Appears acute, but unclear when this started  4:14 AM Patient now reports recent diarrhea as well as vomiting.  She reports nausea.  No abdominal pain at this time.  She still reports dizziness.  She is noted to be hypokalemic, and A. fib with RVR.  She would need to be admitted.  IV potassium is been ordered. Discussed with Dr. Katrinka Blazing for admission.  Final Clinical Impressions(s) / ED Diagnoses   Final diagnoses:  Atrial fibrillation with  rapid ventricular response (HCC)  Hypokalemia  Syncope and collapse    ED Discharge Orders    None       Zadie Rhine, MD 07/11/17 [phone]

## 2017-07-11 NOTE — Plan of Care (Signed)
  Health Behavior/Discharge Planning: Ability to manage health-related needs will improve 07/11/2017 2123 - Progressing by Herbert PunAddison, Sheralee Qazi Y, RN   Clinical Measurements: Ability to maintain clinical measurements within normal limits will improve 07/11/2017 2123 - Progressing by Herbert PunAddison, Tyreonna Czaplicki Y, RN   Clinical Measurements: Will remain free from infection 07/11/2017 2123 - Progressing by Herbert PunAddison, Deasia Chiu Y, RN

## 2017-07-11 NOTE — Progress Notes (Signed)
  Echocardiogram 2D Echocardiogram has been performed.  Janalyn HarderWest, Luvern Mcisaac R 07/11/2017, 4:37 PM

## 2017-07-11 NOTE — ED Notes (Signed)
ED TO INPATIENT HANDOFF REPORT  Name/Age/Gender Rita Myers 72 y.o. female  Code Status    Code Status Orders  (From admission, onward)        Start     Ordered   07/11/17 0444  Full code  Continuous     07/11/17 0447    Code Status History    Date Active Date Inactive Code Status Order ID Comments User Context   This patient has a current code status but no historical code status.      Home/SNF/Other Home  Chief Complaint Emesis;Dizziness;Fall  Level of Care/Admitting Diagnosis ED Disposition    ED Disposition Condition Comment   Admit  Hospital Area: [facility] [[id number]]  Level of Care: Telemetry [5]  Admit to tele based on following criteria: Complex arrhythmia (Bradycardia/Tachycardia)  Diagnosis: A-fib [facility]) [[id number]]  Admitting Physician: Norval Morton [[id number]]  Attending Physician: Norval Morton [[id number]]  PT Class (Do Not Modify): Observation [104]  PT Acc Code (Do Not Modify): Observation [10022]       Medical History Past Medical History:  Diagnosis Date  . Hyperlipemia   . Hypertension     Allergies No Known Allergies  IV Location/Drains/Wounds Patient Lines/Drains/Airways Status   Active Line/Drains/Airways    Name:   Placement date:   Placement time:   Site:   Days:   Peripheral IV 07/11/17 Right Antecubital   07/11/17    0134    Antecubital   less than 1          Labs/Imaging Results for orders placed or performed during the hospital encounter of 07/11/17 (from the past 48 hour(s))  Lipase, blood     Status: None   Collection Time: 07/11/17  1:29 AM  Result Value Ref Range   Lipase 35 11 - 51 U/L    Comment: Performed at [facility], [place] [address], [place], [place] [address]  Urinalysis, Routine w reflex microscopic     Status: Abnormal   Collection Time: 07/11/17  1:30 AM  Result Value Ref Range   Color, Urine YELLOW YELLOW   APPearance HAZY (A) CLEAR   Specific Gravity, Urine  1.012 1.005 - 1.030   pH 8.0 5.0 - 8.0   Glucose, UA NEGATIVE NEGATIVE mg/dL   Hgb urine dipstick NEGATIVE NEGATIVE   Bilirubin Urine NEGATIVE NEGATIVE   Ketones, ur 5 (A) NEGATIVE mg/dL   Protein, ur NEGATIVE NEGATIVE mg/dL   Nitrite NEGATIVE NEGATIVE   Leukocytes, UA NEGATIVE NEGATIVE    Comment: Performed at [place] [address] [place], [place] [address]  Basic metabolic panel     Status: Abnormal   Collection Time: 07/11/17  1:33 AM  Result Value Ref Range   Sodium 139 135 - 145 mmol/L   Potassium 2.7 (LL) 3.5 - 5.1 mmol/L    Comment: REPEATED TO VERIFY CRITICAL RESULT CALLED TO, READ BACK BY AND VERIFIED WITH: DOSTER,T RN 2.27.19 _0  ZANDO,C    Chloride 100 (L) 101 - 111 mmol/L   CO2 27 22 - 32 mmol/L   Glucose, Bld 168 (H) 65 - 99 mg/dL   BUN 18 6 - 20 mg/dL   Creatinine, Ser 0.69 0.44 - 1.00 mg/dL   Calcium 9.2 8.9 - 10.3 mg/dL   GFR calc non Af Amer >60 >60 mL/min   GFR calc Af Amer >60 >60 mL/min    Comment: (NOTE) The eGFR has been calculated using the CKD EPI equation. This calculation has not  been validated in all clinical situations. eGFR's persistently <60 mL/min signify possible Chronic Kidney Disease.    Anion gap 12 5 - 15    Comment: Performed at [facility], [place] [address], [place], [place] [address]  CBC     Status: Abnormal   Collection Time: 07/11/17  1:33 AM  Result Value Ref Range   WBC 14.9 (H) 4.0 - 10.5 K/uL   RBC 4.69 3.87 - 5.11 MIL/uL   Hemoglobin 14.4 12.0 - 15.0 g/dL   HCT 43.1 36.0 - 46.0 %   MCV 91.9 78.0 - 100.0 fL   MCH 30.7 26.0 - 34.0 pg   MCHC 33.4 30.0 - 36.0 g/dL   RDW 13.7 11.5 - 15.5 %   Platelets 165 150 - 400 K/uL    Comment: Performed at [facility], [place] [address] [place], [place] [address]  I-Stat Troponin, ED (not at [facility])     Status: None   Collection Time: 07/11/17  1:44 AM  Result Value Ref Range   Troponin i, poc 0.00 0.00 - 0.08 ng/mL    Comment 3            Comment: Due to the release kinetics of cTnI, a negative result within the first hours of the onset of symptoms does not rule out myocardial infarction with certainty. If myocardial infarction is still suspected, repeat the test at appropriate intervals.   Ethanol     Status: None   Collection Time: 07/11/17  1:54 AM  Result Value Ref Range   Alcohol, Ethyl (B) <10 <10 mg/dL    Comment:        LOWEST DETECTABLE LIMIT FOR SERUM ALCOHOL IS 10 mg/dL FOR MEDICAL PURPOSES ONLY Performed at [facility] [address], [place], [place] [address]   Protime-INR     Status: None   Collection Time: 07/11/17  1:54 AM  Result Value Ref Range   Prothrombin Time 12.4 11.4 - 15.2 seconds   INR 0.93     Comment: Performed at [facility], [place] [address] [place], [place] [address]  Urine rapid drug screen (hosp performed)     Status: None   Collection Time: 07/11/17  3:26 AM  Result Value Ref Range   Opiates NONE DETECTED NONE DETECTED   Cocaine NONE DETECTED NONE DETECTED   Benzodiazepines NONE DETECTED NONE DETECTED   Amphetamines NONE DETECTED NONE DETECTED   Tetrahydrocannabinol NONE DETECTED NONE DETECTED   Barbiturates NONE DETECTED NONE DETECTED    Comment: (NOTE) DRUG SCREEN FOR MEDICAL PURPOSES ONLY.  IF CONFIRMATION IS NEEDED FOR ANY PURPOSE, NOTIFY LAB WITHIN 5 DAYS. LOWEST DETECTABLE LIMITS FOR URINE DRUG SCREEN Drug Class                     Cutoff (ng/mL) Amphetamine and metabolites    1000 Barbiturate and metabolites    200 Benzodiazepine                 872 Tricyclics and metabolites     300 Opiates and metabolites        300 Cocaine and metabolites        300 THC                            50 Performed at [facility], [place] [address] [place], [place] [address]   Magnesium     Status: None  Collection Time: 07/11/17  5:05 AM  Result Value Ref Range   Magnesium 1.8 1.7 - 2.4 mg/dL     Comment: Performed at [facility], [place] [address], [place], [place] [address]  TSH     Status: None   Collection Time: 07/11/17  5:05 AM  Result Value Ref Range   TSH 3.341 0.350 - 4.500 uIU/mL    Comment: Performed by a 3rd Generation assay with a functional sensitivity of <=0.01 uIU/mL. Performed at [facility], [place] [address], [place], [place] [address]   Troponin I     Status: None   Collection Time: 07/11/17  5:05 AM  Result Value Ref Range   Troponin I <0.03 <0.03 ng/mL    Comment: Performed at [facility], [place] [address], [place], [place] [address]  D-dimer, quantitative (not at [facility])     Status: None   Collection Time: 07/11/17  5:05 AM  Result Value Ref Range   D-Dimer, Quant 0.47 0.00 - 0.50 ug/mL-FEU    Comment: (NOTE) At the manufacturer cut-off of 0.50 ug/mL FEU, this assay has been documented to exclude PE with a sensitivity and negative predictive value of 97 to 99%.  At this time, this assay has not been approved by the FDA to exclude DVT/VTE. Results should be correlated with clinical presentation. Performed at [facility], [place] [address] [place], [place] [address]   Troponin I     Status: Abnormal   Collection Time: 07/11/17 10:43 AM  Result Value Ref Range   Troponin I 0.03 (HH) <0.03 ng/mL    Comment: CRITICAL RESULT CALLED TO, READ BACK BY AND VERIFIED WITH: RAND,K. RN AT 3545 07/11/17 MULLINS,T Performed at [facility], [place] [address], [place],  [address]    Dg Chest 2 View  Result Date: 07/11/2017 CLINICAL DATA:  72 year old female with weakness. EXAM: CHEST  2 VIEW COMPARISON:  None. FINDINGS: Minimal bibasilar dependent atelectatic changes. No focal consolidation, pleural effusion, or pneumothorax. Mild cardiomegaly. Degenerative changes of the spine. No acute osseous pathology. IMPRESSION: No active cardiopulmonary disease. Electronically Signed    By: Anner Crete M.D.   On: 07/11/2017 02:14   Ct Head Wo Contrast  Result Date: 07/11/2017 CLINICAL DATA:  Altered level of consciousness.  Weakness. EXAM: CT HEAD WITHOUT CONTRAST TECHNIQUE: Contiguous axial images were obtained from the base of the skull through the vertex without intravenous contrast. COMPARISON:  None. FINDINGS: Brain: No acute intracranial abnormality. Specifically, no hemorrhage, hydrocephalus, mass lesion, acute infarction, or significant intracranial injury. Vascular: No hyperdense vessel or unexpected calcification. Skull: No acute calvarial abnormality. Sinuses/Orbits: Visualized paranasal sinuses and mastoids clear. Orbital soft tissues unremarkable. Other: None IMPRESSION: No acute intracranial abnormality. Electronically Signed   By: Rolm Baptise M.D.   On: 07/11/2017 02:20    Pending Labs Unresulted Labs (From admission, onward)   Start     Ordered   07/12/17 0500  CBC  Tomorrow morning,   R     07/11/17 0447   07/12/17 8937  Basic metabolic panel  Tomorrow morning,   R     07/11/17 0447   07/12/17 0500  CBC with Differential/Platelet  Tomorrow morning,   R     07/11/17 1235   07/12/17 0500  Comprehensive metabolic panel  Tomorrow morning,   R     07/11/17 1235   07/12/17 0500  Magnesium  Tomorrow morning,   R     07/11/17 1235  07/12/17 0500  Phosphorus  Tomorrow morning,   R     07/11/17 1235   07/11/17 1252  Basic metabolic panel  Once,   R     07/11/17 0949   07/11/17 0445  Troponin I  Now then every 6 hours,   R     07/11/17 0447      Vitals/Pain Today's Vitals   07/11/17 1330 07/11/17 1400 07/11/17 1430 07/11/17 1500  BP: (!) 112/55 113/70 120/61 119/62  Pulse: 83 83 81 73  Resp: _0 Temp:      TempSrc:      SpO2: 92% 98% 97% 99%  Weight:      Height:      PainSc:        Isolation Precautions No active isolations  Medications Medications  aspirin EC tablet 81 mg (81 mg Oral Given 07/11/17 0935)  loratadine (CLARITIN)  tablet 10 mg (10 mg Oral Given 07/11/17 0935)  pravastatin (PRAVACHOL) tablet 20 mg (20 mg Oral Given 07/11/17 0935)  thiamine (VITAMIN B-1) tablet 50 mg (50 mg Oral Given 07/11/17 0935)  polyvinyl alcohol (LIQUIFILM TEARS) 1.4 % ophthalmic solution 1 drop (not administered)  ondansetron (ZOFRAN) tablet 4 mg (not administered)    Or  ondansetron (ZOFRAN) injection 4 mg (not administered)  acetaminophen (TYLENOL) tablet 650 mg (not administered)    Or  acetaminophen (TYLENOL) suppository 650 mg (not administered)  albuterol (PROVENTIL) (2.5 MG/3ML) 0.083% nebulizer solution 2.5 mg (not administered)  enoxaparin (LOVENOX) injection 40 mg (40 mg Subcutaneous Given 07/11/17 0936)  diltiazem (CARDIZEM CD) 24 hr capsule 240 mg (240 mg Oral Given 07/11/17 0936)  ondansetron (ZOFRAN) injection 4 mg (4 mg Intravenous Given 07/11/17 0146)  potassium chloride SA (K-DUR,KLOR-CON) CR tablet 40 mEq (40 mEq Oral Given 07/11/17 0319)  potassium chloride 10 mEq in 100 mL IVPB (0 mEq Intravenous Stopped 07/11/17 0428)  diltiazem (CARDIZEM) tablet 120 mg (120 mg Oral Given 07/11/17 0424)  potassium chloride 10 mEq in 100 mL IVPB (0 mEq Intravenous Stopped 07/11/17 0934)  magnesium sulfate IVPB 1 g 100 mL (0 g Intravenous Stopped 07/11/17 0824)    Mobility walks

## 2017-07-11 NOTE — ED Notes (Addendum)
Sheikh,MD paged about patients troponin level of 0.03.

## 2017-07-11 NOTE — ED Notes (Signed)
EKG given to EDP,Schlossman,MD., for review. 

## 2017-07-11 NOTE — ED Triage Notes (Signed)
Pt developed dizziness and vomiting tonight, become weak and was found in floor by husband.

## 2017-07-11 NOTE — Progress Notes (Signed)
The patient was admitted early this AM after midnight and H and P has been reviewed and I am in current agreement with the Assessment and Plan done by Dr. Madelyn Flavorsondell Smith. Additional changes to the plan of care have been made accordingly. The patient is a 72 year old Female with a PMH of HTN, HLD, Hx of Irregular Heartbeat and other comorbidities who presented to the ED with a cc of Syncopal episode after having Nausea/Vomiting, and a Large Loose stool. After patient was vomiting she felt dizzy and weak, and fell to the floor. She was found to be in A Fib RVR and given Cardizem. Cardiology was consulted for further evaluation and recommendations and recommending continuing home Diltiazem and starting Anticoagulation with Apixaban 5 mg po BID. ECHOCardiogram was ordered and pending. Will order PT/OT Evaluation and Orthostatic Vital Signs. Patient's Potassium was low so will replete and re-evaluate this Afternoon. WBC was elevated at 14.9. Will continue to Monitor and evaluate patient's clinical response to intervention and repeat blood work in AM. Anticipate D/C Home in next 24-48 hours pending improvement.

## 2017-07-12 ENCOUNTER — Telehealth: Payer: Self-pay | Admitting: Nurse Practitioner

## 2017-07-12 ENCOUNTER — Other Ambulatory Visit: Payer: Self-pay | Admitting: Cardiology

## 2017-07-12 DIAGNOSIS — R55 Syncope and collapse: Secondary | ICD-10-CM | POA: Diagnosis not present

## 2017-07-12 DIAGNOSIS — I4891 Unspecified atrial fibrillation: Secondary | ICD-10-CM | POA: Diagnosis not present

## 2017-07-12 DIAGNOSIS — I480 Paroxysmal atrial fibrillation: Secondary | ICD-10-CM | POA: Diagnosis not present

## 2017-07-12 DIAGNOSIS — I10 Essential (primary) hypertension: Secondary | ICD-10-CM | POA: Diagnosis not present

## 2017-07-12 DIAGNOSIS — I340 Nonrheumatic mitral (valve) insufficiency: Secondary | ICD-10-CM | POA: Diagnosis not present

## 2017-07-12 DIAGNOSIS — E876 Hypokalemia: Secondary | ICD-10-CM | POA: Diagnosis not present

## 2017-07-12 DIAGNOSIS — R112 Nausea with vomiting, unspecified: Secondary | ICD-10-CM | POA: Diagnosis not present

## 2017-07-12 DIAGNOSIS — R197 Diarrhea, unspecified: Secondary | ICD-10-CM | POA: Diagnosis not present

## 2017-07-12 LAB — CBC WITH DIFFERENTIAL/PLATELET
BASOS ABS: 0 10*3/uL (ref 0.0–0.1)
Basophils Relative: 0 %
Eosinophils Absolute: 0.2 10*3/uL (ref 0.0–0.7)
Eosinophils Relative: 2 %
HEMATOCRIT: 37 % (ref 36.0–46.0)
HEMOGLOBIN: 12.4 g/dL (ref 12.0–15.0)
LYMPHS PCT: 37 %
Lymphs Abs: 3.4 10*3/uL (ref 0.7–4.0)
MCH: 31.4 pg (ref 26.0–34.0)
MCHC: 33.5 g/dL (ref 30.0–36.0)
MCV: 93.7 fL (ref 78.0–100.0)
Monocytes Absolute: 0.7 10*3/uL (ref 0.1–1.0)
Monocytes Relative: 8 %
NEUTROS ABS: 4.9 10*3/uL (ref 1.7–7.7)
NEUTROS PCT: 53 %
Platelets: 148 10*3/uL — ABNORMAL LOW (ref 150–400)
RBC: 3.95 MIL/uL (ref 3.87–5.11)
RDW: 14.1 % (ref 11.5–15.5)
WBC: 9.1 10*3/uL (ref 4.0–10.5)

## 2017-07-12 LAB — COMPREHENSIVE METABOLIC PANEL
ALT: 26 U/L (ref 14–54)
AST: 32 U/L (ref 15–41)
Albumin: 3.6 g/dL (ref 3.5–5.0)
Alkaline Phosphatase: 36 U/L — ABNORMAL LOW (ref 38–126)
Anion gap: 7 (ref 5–15)
BILIRUBIN TOTAL: 0.8 mg/dL (ref 0.3–1.2)
BUN: 13 mg/dL (ref 6–20)
CO2: 25 mmol/L (ref 22–32)
CREATININE: 0.7 mg/dL (ref 0.44–1.00)
Calcium: 8.6 mg/dL — ABNORMAL LOW (ref 8.9–10.3)
Chloride: 113 mmol/L — ABNORMAL HIGH (ref 101–111)
GFR calc Af Amer: >60 mL/min (ref >60)
Glucose, Bld: 107 mg/dL — ABNORMAL HIGH (ref 65–99)
Potassium: 3.5 mmol/L (ref 3.5–5.1)
Sodium: 145 mmol/L (ref 135–145)
TOTAL PROTEIN: 6.3 g/dL — AB (ref 6.5–8.1)

## 2017-07-12 LAB — PHOSPHORUS: Phosphorus: 2.1 mg/dL — ABNORMAL LOW (ref 2.5–4.6)

## 2017-07-12 LAB — MAGNESIUM: MAGNESIUM: 2.3 mg/dL (ref 1.7–2.4)

## 2017-07-12 MED ORDER — APIXABAN 5 MG PO TABS
5.0000 mg | ORAL_TABLET | Freq: Two times a day (BID) | ORAL | Status: DC
  Administered 2017-07-12: 5 mg via ORAL
  Filled 2017-07-12: qty 1

## 2017-07-12 MED ORDER — HYDROCHLOROTHIAZIDE 12.5 MG PO CAPS: 12.5000 mg | ORAL_CAPSULE | Freq: Every day | ORAL | 0 refills | Status: DC

## 2017-07-12 MED ORDER — HYDROCHLOROTHIAZIDE 12.5 MG PO CAPS
12.5000 mg | ORAL_CAPSULE | Freq: Every day | ORAL | Status: DC
  Administered 2017-07-12: 12.5 mg via ORAL
  Filled 2017-07-12: qty 1

## 2017-07-12 MED ORDER — POTASSIUM PHOSPHATES 15 MMOLE/5ML IV SOLN
20.0000 mmol | Freq: Once | INTRAVENOUS | Status: AC
  Administered 2017-07-12: 20 mmol via INTRAVENOUS
  Filled 2017-07-12: qty 6.67

## 2017-07-12 MED ORDER — LOSARTAN POTASSIUM 25 MG PO TABS: 25.0000 mg | ORAL_TABLET | Freq: Every day | ORAL | 0 refills | Status: DC

## 2017-07-12 MED ORDER — LOSARTAN POTASSIUM 25 MG PO TABS
25.0000 mg | ORAL_TABLET | Freq: Every day | ORAL | Status: DC
  Administered 2017-07-12: 25 mg via ORAL
  Filled 2017-07-12: qty 1

## 2017-07-12 MED ORDER — APIXABAN 5 MG PO TABS: 5.0000 mg | ORAL_TABLET | Freq: Two times a day (BID) | ORAL | 0 refills | Status: DC

## 2017-07-12 NOTE — Discharge Summary (Signed)
Physician Discharge Summary  Rita Myers ZOX:[id number] DOB: 1945/11/22 DOA: 07/11/2017  PCP: Darrow Bussing, MD  Admit date: 07/11/2017 Discharge date: 07/12/2017  Admitted From: Home Disposition: Home  Recommendations for Outpatient Follow-up:  1. Follow up with PCP in 1-2 weeks 2. Follow up with Cardiology on March 5th, 2019 for Follow up 3. Follow up with Cardiology on March 12th, 2019 for 30 Day Monitor 4. Please obtain CMP/CBC, Mag, Phos in one week 5. Please follow up on the following pending results:  Home Health: No Equipment/Devices: Single [employer]    Discharge Condition: Stable  CODE STATUS: FULL CODE Diet recommendation: Heart Healthy Diet  Brief/Interim Summary: The patient is a 72 year old Female with a PMH of HTN, HLD, Hx of Irregular Heartbeat and other comorbidities who presented to the ED with a cc of Syncopal episode after having Nausea/Vomiting, and a Large Loose stool. After patient was vomiting she felt dizzy and weak, and fell to the floor. She was found to be in A Fib RVR and given Cardizem. Cardiology was consulted for further evaluation and recommendations and recommending continuing home Diltiazem and starting Anticoagulation with Apixaban 5 mg po BID. ECHOCardiogram was ordered and showed Diastolic Dysfunction and Moderate Mitral Regurgitation. Ordered PT/OT Evaluation but patient ambulated and Orthostatic Vital Signs done and showed patient was not orthostatic. Patient's Potassium was low so it was replete. WBC was elevated at 14.9 but improved to 9.1. Cardiology added BP control and deemed the patient to D/C Home and follow up as an outpatient for 30 day monitor and outpatient evaluation.   Discharge Diagnoses:  Principal Problem:   A-fib (HCC) Active Problems:   Syncope, vasovagal   Nausea, vomiting, and diarrhea   Hypokalemia   Essential hypertension  Syncope 2/2 to Vasovagal Mediated from N/V -Passed out after Nausea and Vomiting and 1 episode of  Diarrhea -Acute PE less Likely as D-Dimer was 0.47 -Head CT showed no acute intracranial abnormality -TSH was 3.341 -UDS Negative -ECHOCardiogram showed EF of 50-55% with Grade 1 DD -Orthostatics done -Patient was Continued on Telemetry and converted to NSR -PT/OT recommending no follow up   Nausea/Vomiting/Diarrhea -Improved. -Likely related to Food Poisoning/Gastroenteritis  -WBC improved  Thrombocytopenia -Platelet Count was 148 -Continue to Monitor for S/Sx of Bleeding now that patient is going to be Anticoagulated. -Follow up with PCP and repeat CBC in AM   Paroxysmal Atrial Fibrillation now in NSR -?Mediated after Nausea and Vomiting  -CHADS2-VASc was 3 -Cardiology evaluated and recommending Continuing Diltiazem 240 mg Daily -Cardiology recommending starting Anticoagulation and patient was started on Apixaban 5 mg po BID so will be stopping ASA 81 mg po Daily  -Was on Telemetry while hospitalized -Cardiology Arranging 30 Day monitor -Follow up with Cardiology in the coming weeks  Essential HTN -BP was elevated so Cardiology started Losartan 25 mg daily -Patient's Home HCTZ was reduced to 12.5 mg po Daily -C/w Diltiazem 240 mg po Daily   Mitral Regurgitation  -Was seen ECHO and read as moderate to severe -Discussed with Cardiology Dr. Katrinka Blazing and he feels as ECHO was over read  -Murmur was not significant and Dr. Katrinka Blazing feels like MVP -Cardiology feels like no specific management strategy at this time  -Follow up as an outpatient   HLD -C/w Pravastatin 20 mg po Daily   Hypokalemia -From Nausea/Vomiting -Replete and Improved -Follow up as an outpatient and repeat CMP  Hypophosphatemia -Patient's Phos was 2.1 -Replete and follow up as an outpatient   Mildly Elevated  Troponin -Likely from A Fib -Not indicative on ACS   Discharge Instructions  Discharge Instructions    Call MD for:  difficulty breathing, headache or visual disturbances   Complete by:  As  directed    Call MD for:  extreme fatigue   Complete by:  As directed    Call MD for:  hives   Complete by:  As directed    Call MD for:  persistant dizziness or light-headedness   Complete by:  As directed    Call MD for:  persistant nausea and vomiting   Complete by:  As directed    Call MD for:  redness, tenderness, or signs of infection (pain, swelling, redness, odor or green/yellow discharge around incision site)   Complete by:  As directed    Call MD for:  severe uncontrolled pain   Complete by:  As directed    Call MD for:  temperature >100.4   Complete by:  As directed    Diet - low sodium heart healthy   Complete by:  As directed    Discharge instructions   Complete by:  As directed    Follow up with PCP and Cardiology at D/C. Take all medications as prescribed. If symptoms change or worsen please return to the ED for evaluation. Follow up with Cardiology NP on March 5th and Follow up for 30 day Monitor scheduled for March 12th.   Increase activity slowly   Complete by:  As directed      Allergies as of 07/12/2017   No Known Allergies     Medication List    STOP taking these medications   aspirin EC 81 MG tablet   hydrochlorothiazide 25 MG tablet Commonly known as:  HYDRODIURIL Replaced by:  hydrochlorothiazide 12.5 MG capsule     TAKE these medications   apixaban 5 MG Tabs tablet Commonly known as:  ELIQUIS Take 1 tablet (5 mg total) by mouth 2 (two) times daily.   calcium citrate-vitamin D 315-200 MG-UNIT tablet Commonly known as:  CITRACAL+D Take 1 tablet by mouth 2 (two) times daily.   diltiazem 240 MG 24 hr capsule Commonly known as:  DILACOR XR Take 240 mg by mouth daily.   Fish Oil 1000 MG Caps Take 1,000 mg by mouth daily.   hydrochlorothiazide 12.5 MG capsule Commonly known as:  MICROZIDE Take 1 capsule (12.5 mg total) by mouth daily. Replaces:  hydrochlorothiazide 25 MG tablet   hydroxypropyl methylcellulose / hypromellose 2.5 % ophthalmic  solution Commonly known as:  ISOPTO TEARS / GONIOVISC Place 1 drop into both eyes 3 (three) times daily as needed for dry eyes.   loratadine 10 MG tablet Commonly known as:  CLARITIN Take 10 mg by mouth daily.   losartan 25 MG tablet Commonly known as:  COZAAR Take 1 tablet (25 mg total) by mouth daily.   pravastatin 20 MG tablet Commonly known as:  PRAVACHOL Take 20 mg by mouth daily.   thiamine 50 MG tablet Commonly known as:  VITAMIN B-1 Take 50 mg by mouth daily.   VITAMIN D PO Take 1 capsule by mouth daily.      Follow-up Information    [facility] - Dme Follow up.   Why:  Single point cane Contact information: [address] [place] [address] [phone]        Rosalio Macadamia, NP Follow up.   Specialties:  Nurse Practitioner, Interventional Cardiology, Cardiology, Radiology Why:  Cardiology hospital follow up on  March 5th at 10:00 with Dr. Michaelle CopasSmith's Nurse Practitioner. Please arrive 15 minutes early for check in.  Your labs will be checked at this appointment.  Contact information: [address] [place] [address] [phone]        [facility] [address] Follow up.   Specialty:  Cardiology Why:  You have an appointment on March 12th at 11:00 to have an outpatient 30 day cardiac monitor placed.  Contact information: [address], [address] [place] [id number] [phone]       Darrow BussingKoirala, Dibas, MD. Call.   Specialty:  Family Medicine Why:  Follow up within 1 week  Contact information: [address] [place] [address] [phone]        Lyn RecordsSmith, Henry W, MD Follow up.   Specialty:  Cardiology Contact information: [address] [place] [address] [phone]          No Known Allergies  Consultations:  Cardiology Dr. Garnette ScheuermannHank Smith  Procedures/Studies: Dg Chest 2 View  Result Date: 07/11/2017 CLINICAL DATA:  72 year old  female with weakness. EXAM: CHEST  2 VIEW COMPARISON:  None. FINDINGS: Minimal bibasilar dependent atelectatic changes. No focal consolidation, pleural effusion, or pneumothorax. Mild cardiomegaly. Degenerative changes of the spine. No acute osseous pathology. IMPRESSION: No active cardiopulmonary disease. Electronically Signed   By: Elgie CollardArash  Radparvar M.D.   On: 07/11/2017 02:14   Ct Head Wo Contrast  Result Date: 07/11/2017 CLINICAL DATA:  Altered level of consciousness.  Weakness. EXAM: CT HEAD WITHOUT CONTRAST TECHNIQUE: Contiguous axial images were obtained from the base of the skull through the vertex without intravenous contrast. COMPARISON:  None. FINDINGS: Brain: No acute intracranial abnormality. Specifically, no hemorrhage, hydrocephalus, mass lesion, acute infarction, or significant intracranial injury. Vascular: No hyperdense vessel or unexpected calcification. Skull: No acute calvarial abnormality. Sinuses/Orbits: Visualized paranasal sinuses and mastoids clear. Orbital soft tissues unremarkable. Other: None IMPRESSION: No acute intracranial abnormality. Electronically Signed   By: Charlett NoseKevin  Dover M.D.   On: 07/11/2017 02:20   ECHOCARDIOGRAM ------------------------------------------------------------------- Study Conclusions  - Left ventricle: The cavity size was mildly dilated. Systolic   function was normal. The estimated ejection fraction was in the   range of 50% to 55%. Wall motion was normal; there were no   regional wall motion abnormalities. Doppler parameters are   consistent with abnormal left ventricular relaxation (grade 1   diastolic dysfunction). - Aortic valve: There was trivial regurgitation. - Mitral valve: Calcified annulus. Mildly thickened leaflets .   There was moderate to severe regurgitation, with multiple jets. - Left atrium: The atrium was moderately dilated. - Tricuspid valve: There was moderate regurgitation. - Pulmonary arteries: Systolic pressure was  moderately increased.   PA peak pressure: 58 mm Hg (S).  Subjective: Seen and improved and felt well. No CP or SOB. Ready to go home.   Discharge Exam: Vitals:   07/12/17 0543 07/12/17 1653  BP: 133/61 (!) 155/70  Pulse: 88 89  Resp: 16   Temp: 98.9 F (37.2 C) 98.2 F (36.8 C)  SpO2: 96%    Vitals:   07/11/17 1701 07/11/17 2041 07/12/17 0543 07/12/17 1653  BP: (!) 149/65 (!) 148/66 133/61 (!) 155/70  Pulse: 87 90 88 89  Resp:  18 16   Temp:  98.6 F (37 C) 98.9 F (37.2 C) 98.2 F (36.8 C)  TempSrc:  Oral Oral Oral  SpO2:  95% 96%   Weight:  Height:       General: Pt is alert, awake, not in acute distress Cardiovascular: RRR, S1/S2 +, no rubs, no gallops Respiratory: CTA bilaterally, no wheezing, no rhonchi Abdominal: Soft, NT, ND, bowel sounds + Extremities: no edema, no cyanosis  The results of significant diagnostics from this hospitalization (including imaging, microbiology, ancillary and laboratory) are listed below for reference.    Microbiology: No results found for this or any previous visit (from the past 240 hour(s)).   Labs: BNP (last 3 results) No results for input(s): BNP in the last 8760 hours. Basic Metabolic Panel: Recent Labs  Lab 07/11/17 0133 07/11/17 0505 07/11/17 1651 07/12/17 0446  NA 139  --  143 145  K 2.7*  --  3.6 3.5  CL 100*  --  112* 113*  CO2 27  --  26 25  GLUCOSE 168*  --  113* 107*  BUN 18  --  9 13  CREATININE 0.69  --  0.63 0.70  CALCIUM 9.2  --  8.3* 8.6*  MG  --  1.8  --  2.3  PHOS  --   --   --  2.1*   Liver Function Tests: Recent Labs  Lab 07/12/17 0446  AST 32  ALT 26  ALKPHOS 36*  BILITOT 0.8  PROT 6.3*  ALBUMIN 3.6   Recent Labs  Lab 07/11/17 0129  LIPASE 35   No results for input(s): AMMONIA in the last 168 hours. CBC: Recent Labs  Lab 07/11/17 0133 07/12/17 0446  WBC 14.9* 9.1  NEUTROABS  --  4.9  HGB 14.4 12.4  HCT 43.1 37.0  MCV 91.9 93.7  PLT 165 148*   Cardiac  Enzymes: Recent Labs  Lab 07/11/17 0505 07/11/17 1043 07/11/17 1651  TROPONINI <0.03 0.03* 0.03*   BNP: Invalid input(s): POCBNP CBG: No results for input(s): GLUCAP in the last 168 hours. D-Dimer Recent Labs    07/11/17 0505  DDIMER 0.47   Hgb A1c No results for input(s): HGBA1C in the last 72 hours. Lipid Profile No results for input(s): CHOL, HDL, LDLCALC, TRIG, CHOLHDL, LDLDIRECT in the last 72 hours. Thyroid function studies Recent Labs    07/11/17 0505  TSH 3.341   Anemia work up No results for input(s): VITAMINB12, FOLATE, FERRITIN, TIBC, IRON, RETICCTPCT in the last 72 hours. Urinalysis    Component Value Date/Time   COLORURINE YELLOW 07/11/2017 0130   APPEARANCEUR HAZY (A) 07/11/2017 0130   LABSPEC 1.012 07/11/2017 0130   PHURINE 8.0 07/11/2017 0130   GLUCOSEU NEGATIVE 07/11/2017 0130   HGBUR NEGATIVE 07/11/2017 0130   BILIRUBINUR NEGATIVE 07/11/2017 0130   KETONESUR 5 (A) 07/11/2017 0130   PROTEINUR NEGATIVE 07/11/2017 0130   NITRITE NEGATIVE 07/11/2017 0130   LEUKOCYTESUR NEGATIVE 07/11/2017 0130   Sepsis Labs Invalid input(s): PROCALCITONIN,  WBC,  LACTICIDVEN Microbiology No results found for this or any previous visit (from the past 240 hour(s)).  Time coordinating discharge: 35 minutes  SIGNED:  Merlene Laughter, DO [facility] 07/12/2017, 10:44 PM Pager [phone]  If 7PM-7AM, please contact night-coverage [email or web address] Password [email or web address]

## 2017-07-12 NOTE — Progress Notes (Signed)
Checked benefit for apixaban-co pay$45-dtr voiced understanding.

## 2017-07-12 NOTE — Discharge Instructions (Signed)

## 2017-07-12 NOTE — Progress Notes (Signed)
Report received from C. Addison, RN. No change from initial pm assessment. Will continue to monitor and follow the POC.  

## 2017-07-12 NOTE — Care Management Note (Signed)
Case Management Note  Patient Details  Name: Leo Rodona Soave MRN: [id number] Date of Birth: 05/15/1946  Subjective/Objective: PT/OT-no f/u,recc cane-ordered single point cane-[facility] dme rep Clydie BraunKaren will deliver to rm prior d/c.Dtr will get shower chair on own since out of pocket cost. Provided w/Eliquis 30day free discount coupon. Daughter voiced understanding.No further CM needs.                   Action/Plan:d/c home w/dme   Expected Discharge Date:  (unknown)               Expected Discharge Plan:  Home/Self Care  In-House Referral:     Discharge planning Services  CM Consult  Post Acute Care Choice:    Choice offered to:     DME Arranged:  Cane DME Agency:     HH Arranged:    HH Agency:     Status of Service:  Completed, signed off  If discussed at [employer] Meetings, dates discussed:    Additional Comments:  Lanier ClamMahabir, Ardith Test, RN 07/12/2017, 11:14 AM

## 2017-07-12 NOTE — Telephone Encounter (Signed)
Jeraldine LootsHammond called for a TOC  Lori on 07/17/2017 @ 10Am

## 2017-07-12 NOTE — Progress Notes (Signed)
Progress Note  Patient Name: Rita Myers Date of Encounter: 07/12/2017  Primary Cardiologist: Lesleigh NoeHenry W Smith III, MD   Subjective   Feels well.  No recurrence of atrial fibrillation of clinical significance.  Several salvos of SVT.  Inpatient Medications    Scheduled Meds: . apixaban  5 mg Oral BID  . diltiazem  240 mg Oral Daily  . loratadine  10 mg Oral Daily  . pravastatin  20 mg Oral Daily  . thiamine  50 mg Oral Daily   Continuous Infusions: . potassium PHOSPHATE IVPB (mmol) 20 mmol (07/12/17 0946)   PRN Meds: acetaminophen **OR** acetaminophen, albuterol, ondansetron **OR** ondansetron (ZOFRAN) IV, polyvinyl alcohol   Vital Signs    Vitals:   07/11/17 1700 07/11/17 1701 07/11/17 2041 07/12/17 0543  BP: (!) 143/65 (!) 149/65 (!) 148/66 133/61  Pulse: 86 87 90 88  Resp:   18 16  Temp:   98.6 F (37 C) 98.9 F (37.2 C)  TempSrc:   Oral Oral  SpO2:   95% 96%  Weight:      Height:        Intake/Output Summary (Last 24 hours) at 07/12/2017 1345 Last data filed at 07/11/2017 2200 Gross per 24 hour  Intake 300 ml  Output -  Net 300 ml   Filed Weights   07/11/17 0109 07/11/17 1659  Weight: 138 lb (62.6 kg) 141 lb 3.2 oz (64 kg)    Telemetry    Mostly sinus rhythm with nonsustained runs of SVT.  These episodes of tachycardia could be brief atrial fibrillation.- Personally Reviewed  ECG    No new data- Personally Reviewed  Physical Exam  Obese GEN: No acute distress.   Neck: No JVD Cardiac: RRR, with trace to 1+ left lower sternal border systolic murmur. There is no rubs, or gallops.  Respiratory: Clear to auscultation bilaterally. GI: Soft, nontender, non-distended  MS: No edema; No deformity. Neuro:  Nonfocal  Psych: Normal affect   Labs    Chemistry Recent Labs  Lab 07/11/17 0133 07/11/17 1651 07/12/17 0446  NA 139 143 145  K 2.7* 3.6 3.5  CL 100* 112* 113*  CO2 27 26 25   GLUCOSE 168* 113* 107*  BUN 18 9 13   CREATININE 0.69 0.63  0.70  CALCIUM 9.2 8.3* 8.6*  PROT  --   --  6.3*  ALBUMIN  --   --  3.6  AST  --   --  32  ALT  --   --  26  ALKPHOS  --   --  36*  BILITOT  --   --  0.8  GFRNONAA >60 >60 >60  GFRAA >60 >60 >60  ANIONGAP 12 5 7      Hematology Recent Labs  Lab 07/11/17 0133 07/12/17 0446  WBC 14.9* 9.1  RBC 4.69 3.95  HGB 14.4 12.4  HCT 43.1 37.0  MCV 91.9 93.7  MCH 30.7 31.4  MCHC 33.4 33.5  RDW 13.7 14.1  PLT 165 148*    Cardiac Enzymes Recent Labs  Lab 07/11/17 0505 07/11/17 1043 07/11/17 1651  TROPONINI <0.03 0.03* 0.03*    Recent Labs  Lab 07/11/17 0144  TROPIPOC 0.00     BNPNo results for input(s): BNP, PROBNP in the last 168 hours.   DDimer  Recent Labs  Lab 07/11/17 0505  DDIMER 0.47     Radiology    Dg Chest 2 View  Result Date: 07/11/2017 CLINICAL DATA:  72 year old female with weakness. EXAM: CHEST  2  VIEW COMPARISON:  None. FINDINGS: Minimal bibasilar dependent atelectatic changes. No focal consolidation, pleural effusion, or pneumothorax. Mild cardiomegaly. Degenerative changes of the spine. No acute osseous pathology. IMPRESSION: No active cardiopulmonary disease. Electronically Signed   By: Elgie Collard M.D.   On: 07/11/2017 02:14   Ct Head Wo Contrast  Result Date: 07/11/2017 CLINICAL DATA:  Altered level of consciousness.  Weakness. EXAM: CT HEAD WITHOUT CONTRAST TECHNIQUE: Contiguous axial images were obtained from the base of the skull through the vertex without intravenous contrast. COMPARISON:  None. FINDINGS: Brain: No acute intracranial abnormality. Specifically, no hemorrhage, hydrocephalus, mass lesion, acute infarction, or significant intracranial injury. Vascular: No hyperdense vessel or unexpected calcification. Skull: No acute calvarial abnormality. Sinuses/Orbits: Visualized paranasal sinuses and mastoids clear. Orbital soft tissues unremarkable. Other: None IMPRESSION: No acute intracranial abnormality. Electronically Signed   By: Charlett Nose M.D.   On: 07/11/2017 02:20    Cardiac Studies   Echocardiogram 07/11/2017:  Study Conclusions   - Left ventricle: The cavity size was mildly dilated. Systolic   function was normal. The estimated ejection fraction was in the   range of 50% to 55%. Wall motion was normal; there were no   regional wall motion abnormalities. Doppler parameters are   consistent with abnormal left ventricular relaxation (grade 1   diastolic dysfunction). - Aortic valve: There was trivial regurgitation. - Mitral valve: Calcified annulus. Mildly thickened leaflets .   There was moderate to severe regurgitation, with multiple jets. - Left atrium: The atrium was moderately dilated. - Tricuspid valve: There was moderate regurgitation. - Pulmonary arteries: Systolic pressure was moderately increased.   PA peak pressure: 58 mm Hg (S).  After personally reviewing the echocardiogram I believe mitral regurgitation is more in the mild to moderate range.   Patient Profile     72 y.o. female with a hx of HTN, HLD and reported irregular heartbeat on aspirin who is being seen today for the evaluation of atrial fibrillation.  Eventual spontaneous conversion to normal sinus rhythm.  History of murmur.  Mild to moderate mitral regurgitation noted on echocardiography.   Assessment & Plan    1. Paroxysmal atrial fibrillation, with CHADS2 VA2SC = 3 .  Anticoagulation with apixaban has been started and should be indefinite until further workup can be done.  Will need a 30-day continuous monitor which we will arrange.  Will need a 1-2-week cardiology follow-up along with basic metabolic panel. 2. Mild to moderate mitral regurgitation interpreted as moderate to severe on echo but in clinical setting where no significant murmur is really being heard.  My impression of the echo is that there is mild mitral valve prolapse and mild to moderate (at most) with regurgitation.  I do not believe this requires any specific  management strategy at this time. 3. Essential hypertension -add losartan 25 mg/day and HCTZ 12.5 mg daily. 4. Trace troponin elevation related to demand.  Ischemic evaluation not needed at this time.  Can go home later today after losartan and HCT have been on board for at least 2 hours to make sure there is no significant drop in blood pressure.   For questions or updates, please contact [facility] Please consult [email or web address] for contact info under Cardiology/STEMI.      Signed, Lesleigh Noe, MD  07/12/2017, 1:45 PM

## 2017-07-12 NOTE — Progress Notes (Signed)
ANTICOAGULATION CONSULT NOTE - Initial Consult  Pharmacy Consult for Eliquis Indication: New onset AFib  No Known Allergies  Patient Measurements: Height: 4\' 9"  (144.8 cm) Weight: 141 lb 3.2 oz (64 kg) IBW/kg (Calculated) : 38.6  Vital Signs: Temp: 98.9 F (37.2 C) (02/28 0543) Temp Source: Oral (02/28 0543) BP: 133/61 (02/28 0543) Pulse Rate: 88 (02/28 0543)  Labs: Recent Labs    07/11/17 0133 07/11/17 0154 07/11/17 0505 07/11/17 1043 07/11/17 1651 07/12/17 0446  HGB 14.4  --   --   --   --  12.4  HCT 43.1  --   --   --   --  37.0  PLT 165  --   --   --   --  148*  LABPROT  --  12.4  --   --   --   --   INR  --  0.93  --   --   --   --   CREATININE 0.69  --   --   --  0.63 0.70  TROPONINI  --   --  <0.03 0.03* 0.03*  --     Estimated Creatinine Clearance: 48.2 mL/min (by C-G formula based on SCr of 0.7 mg/dL).   Medical History: Past Medical History:  Diagnosis Date  . Hyperlipemia   . Hypertension     Medications:  Medications Prior to Admission  Medication Sig Dispense Refill Last Dose  . aspirin EC 81 MG tablet Take 81 mg by mouth daily.   07/10/2017 at 7pm  . calcium citrate-vitamin D (CITRACAL+D) 315-200 MG-UNIT tablet Take 1 tablet by mouth 2 (two) times daily.   07/10/2017 at Unknown time  . Cholecalciferol (VITAMIN D PO) Take 1 capsule by mouth daily.   07/10/2017 at Unknown time  . diltiazem (DILACOR XR) 240 MG 24 hr capsule Take 240 mg by mouth daily.   07/10/2017 at Unknown time  . hydrochlorothiazide (HYDRODIURIL) 25 MG tablet Take 25 mg by mouth daily.   07/10/2017 at Unknown time  . hydroxypropyl methylcellulose / hypromellose (ISOPTO TEARS / GONIOVISC) 2.5 % ophthalmic solution Place 1 drop into both eyes 3 (three) times daily as needed for dry eyes.   Past Week at Unknown time  . loratadine (CLARITIN) 10 MG tablet Take 10 mg by mouth daily.   07/10/2017 at Unknown time  . Omega-3 Fatty Acids (FISH OIL) 1000 MG CAPS Take 1,000 mg by mouth daily.    07/10/2017 at Unknown time  . pravastatin (PRAVACHOL) 20 MG tablet Take 20 mg by mouth daily.   Past Week at Unknown time  . thiamine (VITAMIN B-1) 50 MG tablet Take 50 mg by mouth daily.   07/10/2017 at Unknown time   Scheduled:  . apixaban  5 mg Oral BID  . diltiazem  240 mg Oral Daily  . hydrochlorothiazide  12.5 mg Oral Daily  . loratadine  10 mg Oral Daily  . losartan  25 mg Oral Daily  . pravastatin  20 mg Oral Daily  . thiamine  50 mg Oral Daily   PRN: acetaminophen **OR** acetaminophen, albuterol, ondansetron **OR** ondansetron (ZOFRAN) IV, polyvinyl alcohol  Assessment: 73 yoF with PMH HTN, HLD, who presented 2/27 with syncope, nausea, and vomiting; found to be in AFib with RVR. Electrolytes were corrected and patient started on Cardizem. Pharmacy asked to dose Eliquis  Today, 07/12/2017:  Currently on Lovenox 40 mg daily, last dose yesterday at 10a  CBC stable; plt slightly low this AM  SCr stable < 1.5,  Age < 80, Wt > 60 kg  MVR noted on Echo, however cardiologist felt this was unimpressive upon review of the imaging  Goal of Therapy:  Prevention of thromboembolism   Plan:   Have discontinued Lovenox  Cards stopping ASA  Eliquis 5 mg PO bid  Patient education provided by Pharmacy  Bernadene Personrew Clair Bardwell, PharmD, BCPS [phone] 07/12/2017, 2:08 PM

## 2017-07-12 NOTE — Evaluation (Signed)
Physical Therapy Evaluation Patient Details Name: Rita Myers MRN: [id number] DOB: 10/27/1945 Today's Date: 07/12/2017   History of Present Illness  72 yo female admitted with afib, syncopal episode, N/v.  Clinical Impression  On eval, pt was Mod Ind with mobility. No LOB. No dizziness reported. Pt tolerated activity well. Daughter present during session. She requested a straight cane for pt. On assessment, pt does not currently require use of a straight cane for safe ambulation. Explained to pt and daughter that they could purchase one out of pocket if they so choose or if the need for one arises. They requested a shower chair as well. Spoke with OT who stated that would likely be an out of pocket purchase as well. No further PT needs. Will sign off. 1x eval.     Follow Up Recommendations No PT follow up    Equipment Recommendations  None recommended by PT(daughter requesting cane for pt.)    Recommendations for Other Services       Precautions / Restrictions Precautions Precautions: Fall Restrictions Weight Bearing Restrictions: No      Mobility  Bed Mobility Overal bed mobility: Independent             General bed mobility comments: oob in recliner  Transfers Overall transfer level: Independent               General transfer comment: steady when standing.  Min guard for safety when walking  Ambulation/Gait Ambulation/Gait assistance: Modified independent (Device/Increase time) Ambulation Distance (Feet): 150 Feet(150'x1, 75'x1) Assistive device: Straight cane;None Gait Pattern/deviations: WFL(Within Functional Limits)     General Gait Details: No LOB or difficulty noted while ambulating without an assistive device. Walked a 2nd time with a straight cane-daughter requesting cane for pt. Explained to pt that she should only consider using cane when/if needed.   Stairs Stairs: Yes Stairs assistance: Modified independent (Device/Increase time) Stair  Management: Forwards;One rail Left Number of Stairs: 4    Wheelchair Mobility    Modified Rankin (Stroke Patients Only)       Balance Overall balance assessment: No apparent balance deficits (not formally assessed)                                           Pertinent Vitals/Pain Pain Assessment: No/denies pain    Home Living Family/patient expects to be discharged to:: Private residence Living Arrangements: Spouse/significant other Available Help at Discharge: Family Type of Home: House       Home Layout: Bed/bath upstairs Home Equipment: Grab bars - tub/shower Additional Comments: daughter in room and very involved    Prior Function Level of Independence: Independent         Comments: active, cooks     Higher education careers adviser        Extremity/Trunk Assessment   Upper Extremity Assessment Upper Extremity Assessment: Defer to OT evaluation    Lower Extremity Assessment Lower Extremity Assessment: Overall WFL for tasks assessed    Cervical / Trunk Assessment Cervical / Trunk Assessment: Normal  Communication   Communication: No difficulties  Cognition Arousal/Alertness: Awake/alert Behavior During Therapy: WFL for tasks assessed/performed Overall Cognitive Status: Within Functional Limits for tasks assessed  General Comments      Exercises     Assessment/Plan    PT Assessment Patent does not need any further PT services  PT Problem List         PT Treatment Interventions      PT Goals (Current goals can be found in the Care Plan section)  Acute Rehab PT Goals Patient Stated Goal: return to walking in the park daily PT Goal Formulation: All assessment and education complete, DC therapy    Frequency     Barriers to discharge        Co-evaluation               AM-PAC PT "6 Clicks" Daily Activity  Outcome Measure Difficulty turning over in bed (including  adjusting bedclothes, sheets and blankets)?: None Difficulty moving from lying on back to sitting on the side of the bed? : None Difficulty sitting down on and standing up from a chair with arms (e.g., wheelchair, bedside commode, etc,.)?: None Help needed moving to and from a bed to chair (including a wheelchair)?: None Help needed walking in hospital room?: None Help needed climbing 3-5 steps with a railing? : None 6 Click Score: 24    End of Session   Activity Tolerance: Patient tolerated treatment well Patient left: in chair;with call bell/phone within reach;with family/visitor present        Time: 0940-1005 PT Time Calculation (min) (ACUTE ONLY): 25 min   Charges:   PT Evaluation $PT Eval Moderate Complexity: 1 Mod PT Treatments $Gait Training: 8-22 mins   PT G Codes:         Rebeca AlertJannie Tyja Gortney, MPT Pager: [phone]

## 2017-07-12 NOTE — Evaluation (Signed)
Occupational Therapy Evaluation Patient Details Name: Rita Myers MRN: [id number] DOB: 04-25-1946 Today's Date: 07/12/2017    History of Present Illness pt was admitted with syncopal episode. Had N/V and loose stools at night after eating out.  Dx, AFib, h/o HTN   Clinical Impression   This 72 year old female was admitted for the above.  All education was completed. No further OT is needed at this time     Follow Up Recommendations  No OT follow up;Supervision/Assistance - 24 hour    Equipment Recommendations  None recommended by OT    Recommendations for Other Services       Precautions / Restrictions Precautions Precautions: Fall Restrictions Weight Bearing Restrictions: No      Mobility Bed Mobility Overal bed mobility: Independent                Transfers Overall transfer level: Independent               General transfer comment: steady when standing.  Min guard for safety when walking    Balance                                           ADL either performed or assessed with clinical judgement   ADL Overall ADL's : Needs assistance/impaired                         Toilet Transfer: Min guard;Ambulation;Comfort height toilet       Tub/ Shower Transfer: Min guard;Ambulation;Tub transfer;Grab bars     General ADL Comments: pt needs min guard to gather items or set up for adls. She tends to hold to furniture when walking and this is not her baseline.  No LOB. She likes to walk in the park daily.  Educated on energy conservation and also recommended that husband or daughter be right with her when she showers as she doesn't have a Optometrist      Pertinent Vitals/Pain Pain Assessment: No/denies pain     Hand Dominance     Extremity/Trunk Assessment Upper Extremity Assessment Upper Extremity Assessment: Overall WFL for tasks assessed           Communication  Communication Communication: No difficulties   Cognition Arousal/Alertness: Awake/alert Behavior During Therapy: WFL for tasks assessed/performed Overall Cognitive Status: Within Functional Limits for tasks assessed                                     General Comments   Pt denies dizziness. No longer has spinning    Exercises     Shoulder Instructions      Home Living Family/patient expects to be discharged to:: Private residence Living Arrangements: Spouse/significant other                 Bathroom Shower/Tub: Chief Strategy Officer: Standard     Home Equipment: Grab bars - tub/shower   Additional Comments: daughter in room and very involved      Prior Functioning/Environment Level of Independence: Independent        Comments: active, cooks        OT Problem List:  OT Treatment/Interventions:      OT Goals(Current goals can be found in the care plan section) Acute Rehab OT Goals Patient Stated Goal: return to walking in the park daily OT Goal Formulation: All assessment and education complete, DC therapy  OT Frequency:     Barriers to D/C:            Co-evaluation              AM-PAC PT "6 Clicks" Daily Activity     Outcome Measure Help from another person eating meals?: None Help from another person taking care of personal grooming?: A Little Help from another person toileting, which includes using toliet, bedpan, or urinal?: A Little Help from another person bathing (including washing, rinsing, drying)?: A Little Help from another person to put on and taking off regular upper body clothing?: A Little Help from another person to put on and taking off regular lower body clothing?: A Little 6 Click Score: 19   End of Session    Activity Tolerance: Patient tolerated treatment well Patient left: in chair;with call bell/phone within reach;with family/visitor present  OT Visit Diagnosis: Muscle weakness  (generalized) (M62.81)                Time: 9629-52840801-0812 OT Time Calculation (min): 11 min Charges:  OT General Charges $OT Visit: 1 Visit OT Evaluation $OT Eval Low Complexity: 1 Low G-Codes:     Rita Myers, OTR/L [phone] 07/12/2017  Rita Myers 07/12/2017, 8:23 AM

## 2017-07-16 NOTE — Telephone Encounter (Addendum)
**Note De-Identified Rita Myers Obfuscation** [facility]  Patient understands to follow up with provider Norma FredricksonLori Gerhardt, NP on 07/17/17 at 10:00 at [address] [place]. Patient understands discharge instructions? Yes Patient understands medications and regiment? Yes Patient understands to bring all medications to this visit? Yes  The pt came to the phone and gave permission for me to speak to her husband concerning her care.

## 2017-07-17 ENCOUNTER — Ambulatory Visit: Payer: Medicare Other | Admitting: Nurse Practitioner

## 2017-07-17 VITALS — BP 170/90 | HR 97 | Ht <= 58 in | Wt 135.0 lb

## 2017-07-17 DIAGNOSIS — Z79899 Other long term (current) drug therapy: Secondary | ICD-10-CM

## 2017-07-17 DIAGNOSIS — I340 Nonrheumatic mitral (valve) insufficiency: Secondary | ICD-10-CM

## 2017-07-17 DIAGNOSIS — R55 Syncope and collapse: Secondary | ICD-10-CM | POA: Diagnosis not present

## 2017-07-17 DIAGNOSIS — I480 Paroxysmal atrial fibrillation: Secondary | ICD-10-CM | POA: Diagnosis not present

## 2017-07-17 DIAGNOSIS — I10 Essential (primary) hypertension: Secondary | ICD-10-CM

## 2017-07-17 DIAGNOSIS — Z7901 Long term (current) use of anticoagulants: Secondary | ICD-10-CM | POA: Diagnosis not present

## 2017-07-17 MED ORDER — HYDROCHLOROTHIAZIDE 12.5 MG PO CAPS: 12.5000 mg | ORAL_CAPSULE | Freq: Every day | ORAL | 3 refills | Status: DC

## 2017-07-17 MED ORDER — APIXABAN 5 MG PO TABS
5.0000 mg | ORAL_TABLET | Freq: Two times a day (BID) | ORAL | 6 refills | Status: DC
Start: 2017-07-17 — End: 2018-02-07

## 2017-07-17 MED ORDER — LOSARTAN POTASSIUM 50 MG PO TABS: 50.0000 mg | ORAL_TABLET | Freq: Every day | ORAL | 3 refills | Status: DC

## 2017-07-17 MED ORDER — DILTIAZEM HCL ER 240 MG PO CP24: 240.0000 mg | ORAL_CAPSULE | Freq: Every day | ORAL | 3 refills | Status: DC

## 2017-07-17 NOTE — Progress Notes (Addendum)
CARDIOLOGY OFFICE NOTE  Date:  07/17/2017    Rita Myers Date of Birth: May 21, 1945 Medical Record #[id number]  PCP:  Darrow BussingKoirala, Dibas, MD  Cardiologist:  Katrinka BlazingSmith   Chief Complaint  Patient presents with  . Loss of Consciousness  . Atrial Fibrillation    TOC visit - seen for Dr. Katrinka BlazingSmith    History of Present Illness: Rita Rodona Edgin is a 72 y.o. female who presents today for a TOC/post hospital visit. Seen for Dr. Katrinka BlazingSmith.   She has a history ofHTN, HLD, prior irregular heartbeat and other co- morbidities as noted below.   She recently presented to the ED with a witnessed syncopal spell after having N/V and a large loose stool several hours after eating out. Got dizzy after vomiting and fell to the floor - brief period of just a few seconds of unconsciousness. At the hospital she was found to be in AF with RVR. Treated with Cardizem. Eliquis started as well - CHADSVASC of at least 3. Echo was obtained - had diastolic dysfunction and moderate MR - however noted in the record that Dr. Katrinka BlazingSmith did not feel her MR was at this degree. To have outpatient event monitor.   Comes in today. Here with her daughter who augments the history. Patient's English is broken. Says she is doing ok. No more syncopal spells. Not dizzy. Has not really returned to her usual activities. Only home a few days. No chest pain. Some mild DOE but really breathing is ok. BP remains high. No bleeding/bruising. Some muscle soreness along the left side of the neck and shoulder - worse with twisting/turning - felt to be related to the fall. For event monitor next week.    Brings in records - has had remote echo with mild MR and moderate pulmonary HTN noted.   Past Medical History:  Diagnosis Date  . A-fib (HCC) 07/11/2017  . Essential hypertension 07/11/2017  . Hyperlipemia   . Hypertension   . Hypokalemia 07/11/2017  . Nausea, vomiting, and diarrhea 07/11/2017  . Syncope, vasovagal 07/11/2017    History reviewed. No  pertinent surgical history.   Medications: Current Meds  Medication Sig  . apixaban (ELIQUIS) 5 MG TABS tablet Take 1 tablet (5 mg total) by mouth 2 (two) times daily.  . calcium citrate-vitamin D (CITRACAL+D) 315-200 MG-UNIT tablet Take 1 tablet by mouth 2 (two) times daily.  . Cholecalciferol (VITAMIN D PO) Take 1 capsule by mouth daily.  Marland Kitchen. diltiazem (DILACOR XR) 240 MG 24 hr capsule Take 1 capsule (240 mg total) by mouth daily.  . hydrochlorothiazide (MICROZIDE) 12.5 MG capsule Take 1 capsule (12.5 mg total) by mouth daily.  . hydroxypropyl methylcellulose / hypromellose (ISOPTO TEARS / GONIOVISC) 2.5 % ophthalmic solution Place 1 drop into both eyes 3 (three) times daily as needed for dry eyes.  Marland Kitchen. loratadine (CLARITIN) 10 MG tablet Take 10 mg by mouth daily.  . Omega-3 Fatty Acids (FISH OIL) 1000 MG CAPS Take 1,000 mg by mouth daily.  . pravastatin (PRAVACHOL) 20 MG tablet Take 20 mg by mouth daily.  Marland Kitchen. thiamine (VITAMIN B-1) 50 MG tablet Take 50 mg by mouth daily.  . [DISCONTINUED] apixaban (ELIQUIS) 5 MG TABS tablet Take 1 tablet (5 mg total) by mouth 2 (two) times daily.  . [DISCONTINUED] diltiazem (DILACOR XR) 240 MG 24 hr capsule Take 240 mg by mouth daily.  . [DISCONTINUED] hydrochlorothiazide (MICROZIDE) 12.5 MG capsule Take 1 capsule (12.5 mg total) by mouth daily.  . [DISCONTINUED] losartan (  COZAAR) 25 MG tablet Take 1 tablet (25 mg total) by mouth daily.     Allergies: No Known Allergies  Social History: The patient  reports that  has never smoked. she has never used smokeless tobacco. She reports that she does not drink alcohol or use drugs.   Family History: The patient's family history includes Diabetes in her brother, mother, and sister.   Review of Systems: Please see the history of present illness.   Otherwise, the review of systems is positive for none.   All other systems are reviewed and negative.   Physical Exam: VS:  BP (!) 170/90 (BP Location: Left Arm,  Patient Position: Sitting, Cuff Size: Normal)   Pulse 97   Ht 4\' 8"  (1.422 m)   Wt 135 lb (61.2 kg)   BMI 30.27 kg/m  .  BMI Body mass index is 30.27 kg/m.  Wt Readings from Last 3 Encounters:  07/17/17 135 lb (61.2 kg)  07/11/17 141 lb 3.2 oz (64 kg)    General: Pleasant. Speaks little [place]. Alert and in no acute distress.   HEENT: Normal.  Neck: Supple, no JVD, carotid bruits, or masses noted.  Cardiac: Regular rate and rhythm. No murmur that I can appreciate. May have a click. No edema.  Respiratory:  Lungs are clear to auscultation bilaterally with normal work of breathing.  GI: Soft and nontender.  MS: No deformity or atrophy. Gait and ROM intact.  Skin: Warm and dry. Color is normal.  Neuro:  Strength and sensation are intact and no gross focal deficits noted.  Psych: Alert, appropriate and with normal affect.   LABORATORY DATA:  EKG:  EKG is ordered today. This demonstrates NSR. HR 97  Lab Results  Component Value Date   WBC 9.1 07/12/2017   HGB 12.4 07/12/2017   HCT 37.0 07/12/2017   PLT 148 (L) 07/12/2017   GLUCOSE 107 (H) 07/12/2017   ALT 26 07/12/2017   AST 32 07/12/2017   NA 145 07/12/2017   K 3.5 07/12/2017   CL 113 (H) 07/12/2017   CREATININE 0.70 07/12/2017   BUN 13 07/12/2017   CO2 25 07/12/2017   TSH 3.341 07/11/2017   INR 0.93 07/11/2017       BNP (last 3 results) No results for input(s): BNP in the last 8760 hours.  ProBNP (last 3 results) No results for input(s): PROBNP in the last 8760 hours.   Other Studies Reviewed Today:  Echo Study Conclusions 06/2017  - Left ventricle: The cavity size was mildly dilated. Systolic   function was normal. The estimated ejection fraction was in the   range of 50% to 55%. Wall motion was normal; there were no   regional wall motion abnormalities. Doppler parameters are   consistent with abnormal left ventricular relaxation (grade 1   diastolic dysfunction). - Aortic valve: There was trivial  regurgitation. - Mitral valve: Calcified annulus. Mildly thickened leaflets .   There was moderate to severe regurgitation, with multiple jets. - Left atrium: The atrium was moderately dilated. - Tricuspid valve: There was moderate regurgitation. - Pulmonary arteries: Systolic pressure was moderately increased.   PA peak pressure: 58 mm Hg (S).   Assessment/Plan:  1. Syncope - vasovagal - in the setting of N/V and large loose stool - has not recurred. For event monitor next week to rule out tachybrady.   2. PAF - in sinus today. On anticoagulation. HR not ideal but will hold on beta blocker until we see her monitor results.  3. Elevated CHADSVASC - on anticoagulation - no problems noted - lab today.   4. HTN - has had ARB just started at discharge due to uncontrolled HTN - Bp still pretty high - increasing Losartan to 50 mg a day.   5. HLD - on statin - lab from her PCP in January noted.   6. MR - noted that discussion ensued with Dr. Katrinka Blazing who felt the echo was overread - felt like this was MVP. Will need following going forward. Will discuss with him as to the timing.   Current medicines are reviewed with the patient today.  The patient does not have concerns regarding medicines other than what has been noted above.  The following changes have been made:  See above.  Labs/ tests ordered today include:    Orders Placed This Encounter  Procedures  . Basic metabolic panel  . CBC  . EKG 12-Lead     Disposition:   FU with me in one month with EKG.    Patient is agreeable to this plan and will call if any problems develop in the interim.   SignedNorma Fredrickson, NP  07/17/2017 10:39 AM  [facility] [address] [place], [place]  [address] Phone: [phone] Fax: [phone]       Addendum - 07/18/17 Discussed with Dr. Katrinka Blazing - Will follow echo for now - will consider TEE if symptoms occur.   Lyn Records, MD sent to  Rosalio Macadamia, NP        I noticed that you did not hear a murmur either.  I do not think MR is significant. If we evaluate, may need to be with TEE.     Rosalio Macadamia, RN, ANP-C [facility] [address] [place], [place]  [address] [phone]

## 2017-07-17 NOTE — Patient Instructions (Addendum)
We will be checking the following labs today - BMET & CBC   Medication Instructions:    Continue with your current medicines. BUT  I am increasing the Losartan to 50 mg each day - this has been sent to your drug store.   I have refilled the Microzide, Diltiazem, and Eliquis today as well.      Testing/Procedures To Be Arranged:  N/A  Follow-Up:   See me in one month with EKG  Keep appointment for event monitor     Other Special Instructions:   N/A    If you need a refill on your cardiac medications before your next appointment, please call your pharmacy.   Call the [facility] office at [phone] if you have any questions, problems or concerns.

## 2017-07-18 ENCOUNTER — Encounter (INDEPENDENT_AMBULATORY_CARE_PROVIDER_SITE_OTHER): Payer: Self-pay

## 2017-07-18 LAB — CBC
Hematocrit: 45.5 % (ref 34.0–46.6)
Hemoglobin: 15.6 g/dL (ref 11.1–15.9)
MCH: 30.6 pg (ref 26.6–33.0)
MCHC: 34.3 g/dL (ref 31.5–35.7)
MCV: 89 fL (ref 79–97)
Platelets: 202 10*3/uL (ref 150–379)
RBC: 5.09 x10E6/uL (ref 3.77–5.28)
RDW: 13.7 % (ref 12.3–15.4)
WBC: 5.9 10*3/uL (ref 3.4–10.8)

## 2017-07-18 LAB — BASIC METABOLIC PANEL
BUN/Creatinine Ratio: 23 (ref 12–28)
BUN: 17 mg/dL (ref 8–27)
CO2: 26 mmol/L (ref 20–29)
Calcium: 9.5 mg/dL (ref 8.7–10.3)
Chloride: 98 mmol/L (ref 96–106)
Creatinine, Ser: 0.73 mg/dL (ref 0.57–1.00)
GFR calc Af Amer: 94 mL/min/{1.73_m2} (ref >59)
GFR calc non Af Amer: 82 mL/min/{1.73_m2} (ref >59)
Glucose: 122 mg/dL — ABNORMAL HIGH (ref 65–99)
Potassium: 3.9 mmol/L (ref 3.5–5.2)
Sodium: 140 mmol/L (ref 134–144)

## 2017-07-24 ENCOUNTER — Ambulatory Visit (INDEPENDENT_AMBULATORY_CARE_PROVIDER_SITE_OTHER): Payer: Medicare Other

## 2017-07-24 DIAGNOSIS — I480 Paroxysmal atrial fibrillation: Secondary | ICD-10-CM | POA: Diagnosis not present

## 2017-08-24 ENCOUNTER — Encounter: Payer: Self-pay | Admitting: Nurse Practitioner

## 2017-09-03 NOTE — Progress Notes (Signed)
CARDIOLOGY OFFICE NOTE  Date:  09/04/2017    Rita Myers Date of Birth: May 24, 1945 Medical Record #[id number]  PCP:  Darrow Bussing, MD  Cardiologist:  Kyra Manges    Chief Complaint  Patient presents with  . Atrial Fibrillation    Follow up visit - seen for Dr. Katrinka Blazing    History of Present Illness: Rita Myers is a 72 y.o. female who presents today for a 6 week check. Seen for Dr. Katrinka Blazing.   She has a history ofHTN, HLD, prior irregular heartbeat and other co- morbidities as noted below.   She presented to the ED back in February with a witnessed syncopal spell after having N/V and a large loose stool several hours after eating out. Got dizzy after vomiting and fell to the floor - brief period of just a few seconds of unconsciousness. At the hospital she was found to be in AF with RVR. Treated with Cardizem. Eliquis started as well - CHADSVASC of at least 3. Echo was obtained - had diastolic dysfunction and moderate MR - however noted in the record that Dr. Katrinka Blazing did not feel her MR was at this degree. To have outpatient event monitor - this was ok.   I saw her 6 weeks ago - she was doing well. No more syncope. Discussed with Dr. Katrinka Blazing about the echo and we will plan to just follow - would consider TEE if symptoms occur - neither of [place] heard a murmur on exam.   Comes in today. Here with her husband today. She is quite talkative today. Her English not as broken. She is feeling well. Back to all her usual activities. Checking BP at home - much better readings - in the 120's consistently. She says she gets anxious when she comes to the doctor's office. No chest pain. Breathing is good. No bleeding. No bruising. No palpitations. She is very happy and pleased with how she is doing.   Past Medical History:  Diagnosis Date  . A-fib (HCC) 07/11/2017  . Essential hypertension 07/11/2017  . Hyperlipemia   . Hypertension   . Hypokalemia 07/11/2017  . Nausea, vomiting, and diarrhea  07/11/2017  . Syncope, vasovagal 07/11/2017    History reviewed. No pertinent surgical history.   Medications: Current Meds  Medication Sig  . apixaban (ELIQUIS) 5 MG TABS tablet Take 1 tablet (5 mg total) by mouth 2 (two) times daily.  . calcium citrate-vitamin D (CITRACAL+D) 315-200 MG-UNIT tablet Take 1 tablet by mouth 2 (two) times daily.  Marland Kitchen diltiazem (DILACOR XR) 240 MG 24 hr capsule Take 1 capsule (240 mg total) by mouth daily.  . hydrochlorothiazide (MICROZIDE) 12.5 MG capsule Take 1 capsule (12.5 mg total) by mouth daily.  . hydroxypropyl methylcellulose / hypromellose (ISOPTO TEARS / GONIOVISC) 2.5 % ophthalmic solution Place 1 drop into both eyes 3 (three) times daily as needed for dry eyes.  Marland Kitchen loratadine (CLARITIN) 10 MG tablet Take 10 mg by mouth daily.  Marland Kitchen losartan (COZAAR) 50 MG tablet Take 1 tablet (50 mg total) by mouth daily.  . pravastatin (PRAVACHOL) 20 MG tablet Take 20 mg by mouth daily.  Marland Kitchen thiamine (VITAMIN B-1) 50 MG tablet Take 50 mg by mouth daily.     Allergies: No Known Allergies  Social History: The patient  reports that she has never smoked. She has never used smokeless tobacco. She reports that she does not drink alcohol or use drugs.   Family History: The patient's family history includes Diabetes  in her brother, mother, and sister.   Review of Systems: Please see the history of present illness.   Otherwise, the review of systems is positive for none.   All other systems are reviewed and negative.   Physical Exam: VS:  BP (!) 160/88 (BP Location: Left Arm, Patient Position: Sitting, Cuff Size: Normal)   Pulse 100   Ht 4\' 3"  (1.295 m)   Wt 133 lb (60.3 kg)   BMI 35.95 kg/m  .  BMI Body mass index is 35.95 kg/m.  Wt Readings from Last 3 Encounters:  09/04/17 133 lb (60.3 kg)  07/17/17 135 lb (61.2 kg)  07/11/17 141 lb 3.2 oz (64 kg)   BP recheck by me is down to 150/70  General: Pleasant. Well developed, well nourished and in no acute  distress.   HEENT: Normal.  Neck: Supple, no JVD, carotid bruits, or masses noted.  Cardiac: Regular rate and rhythm. No murmurs, rubs, or gallops. No murmur of MR noted again today. No edema.  Respiratory:  Lungs are clear to auscultation bilaterally with normal work of breathing.  GI: Soft and nontender.  MS: No deformity or atrophy. Gait and ROM intact.  Skin: Warm and dry. Color is normal.  Neuro:  Strength and sensation are intact and no gross focal deficits noted.  Psych: Alert, appropriate and with normal affect.   LABORATORY DATA:  EKG:  EKG is ordered today. This demonstrates NSR.  Lab Results  Component Value Date   WBC 5.9 07/17/2017   HGB 15.6 07/17/2017   HCT 45.5 07/17/2017   PLT 202 07/17/2017   GLUCOSE 122 (H) 07/17/2017   ALT 26 07/12/2017   AST 32 07/12/2017   NA 140 07/17/2017   K 3.9 07/17/2017   CL 98 07/17/2017   CREATININE 0.73 07/17/2017   BUN 17 07/17/2017   CO2 26 07/17/2017   TSH 3.341 07/11/2017   INR 0.93 07/11/2017     BNP (last 3 results) No results for input(s): BNP in the last 8760 hours.  ProBNP (last 3 results) No results for input(s): PROBNP in the last 8760 hours.   Other Studies Reviewed Today:  Echo Study Conclusions 06/2017  - Left ventricle: The cavity size was mildly dilated. Systolic function was normal. The estimated ejection fraction was in the range of 50% to 55%. Wall motion was normal; there were no regional wall motion abnormalities. Doppler parameters are consistent with abnormal left ventricular relaxation (grade 1 diastolic dysfunction). - Aortic valve: There was trivial regurgitation. - Mitral valve: Calcified annulus. Mildly thickened leaflets . There was moderate to severe regurgitation, with multiple jets. - Left atrium: The atrium was moderately dilated. - Tricuspid valve: There was moderate regurgitation. - Pulmonary arteries: Systolic pressure was moderately increased. PA peak pressure:  58 mm Hg (S).   Assessment/Plan:  1. Prior episode of syncope - vasovagal - in the setting of N/V and large loose stool - has not recurred. Her event monitor was normal.   2. PAF - she remains in NSR. Remains on Eliquis. No problems noted.    3. Elevated CHADSVASC - on chronic anticoagulation - no problems noted - lab today.   4. HTN - recheck by me is coming down - she has better outpatient control and admits to some anxiety with coming to the office - she will continue to monitor. No changes made today.    5. HLD - on statin therapy - lab by PCP  6. MR - noted previously that discussion  ensued with Dr. Katrinka BlazingSmith who felt the echo was overread - felt like this was MVP. No symptoms - would hold on TEE - no murmur on exam either. Will consider limited echo on return. She is doing very well clinically.   Current medicines are reviewed with the patient today.  The patient does not have concerns regarding medicines other than what has been noted above.  The following changes have been made:  See above.  Labs/ tests ordered today include:    Orders Placed This Encounter  Procedures  . Basic metabolic panel  . CBC  . EKG 12-Lead     Disposition:   FU with me in 6 months.   Patient is agreeable to this plan and will call if any problems develop in the interim.   SignedNorma Fredrickson: Abdalrahman Clementson, NP  09/04/2017 10:20 AM  [facility] [address] [place], [place]  [address] Phone: [phone]) [phone] Fax: [phone]

## 2017-09-04 ENCOUNTER — Encounter: Payer: Self-pay | Admitting: Nurse Practitioner

## 2017-09-04 ENCOUNTER — Ambulatory Visit: Payer: Medicare Other | Admitting: Nurse Practitioner

## 2017-09-04 VITALS — BP 160/88 | HR 100 | Ht <= 58 in | Wt 133.0 lb

## 2017-09-04 DIAGNOSIS — R55 Syncope and collapse: Secondary | ICD-10-CM | POA: Diagnosis not present

## 2017-09-04 DIAGNOSIS — Z79899 Other long term (current) drug therapy: Secondary | ICD-10-CM | POA: Diagnosis not present

## 2017-09-04 DIAGNOSIS — I340 Nonrheumatic mitral (valve) insufficiency: Secondary | ICD-10-CM | POA: Diagnosis not present

## 2017-09-04 DIAGNOSIS — Z7901 Long term (current) use of anticoagulants: Secondary | ICD-10-CM | POA: Diagnosis not present

## 2017-09-04 DIAGNOSIS — I480 Paroxysmal atrial fibrillation: Secondary | ICD-10-CM | POA: Diagnosis not present

## 2017-09-04 LAB — CBC
Hematocrit: 40.1 % (ref 34.0–46.6)
Hemoglobin: 13.9 g/dL (ref 11.1–15.9)
MCH: 31 pg (ref 26.6–33.0)
MCHC: 34.7 g/dL (ref 31.5–35.7)
MCV: 90 fL (ref 79–97)
Platelets: 190 10*3/uL (ref 150–379)
RBC: 4.48 x10E6/uL (ref 3.77–5.28)
RDW: 13.2 % (ref 12.3–15.4)
WBC: 7.6 10*3/uL (ref 3.4–10.8)

## 2017-09-04 LAB — BASIC METABOLIC PANEL
BUN/Creatinine Ratio: 22 (ref 12–28)
BUN: 16 mg/dL (ref 8–27)
CO2: 25 mmol/L (ref 20–29)
Calcium: 10.1 mg/dL (ref 8.7–10.3)
Chloride: 100 mmol/L (ref 96–106)
Creatinine, Ser: 0.72 mg/dL (ref 0.57–1.00)
GFR calc Af Amer: 96 mL/min/{1.73_m2} (ref >59)
GFR calc non Af Amer: 83 mL/min/{1.73_m2} (ref >59)
Glucose: 85 mg/dL (ref 65–99)
Potassium: 3.8 mmol/L (ref 3.5–5.2)
Sodium: 140 mmol/L (ref 134–144)

## 2017-09-04 NOTE — Patient Instructions (Addendum)
We will be checking the following labs today - BMET & CBC   Medication Instructions:    Continue with your current medicines.     Testing/Procedures To Be Arranged:  N/A  Follow-Up:   See me in 6 months with EKG    Other Special Instructions:   Keep a check on your BP for us - bring a diary to next your visit    If you need a refill on your cardiac medications before your next appointment, please call your pharmacy.   Call the [facility] office at [phone] if you have any questions, problems or concerns.

## 2018-02-07 ENCOUNTER — Other Ambulatory Visit: Payer: Self-pay | Admitting: Nurse Practitioner

## 2018-02-22 ENCOUNTER — Telehealth: Payer: Self-pay | Admitting: *Deleted

## 2018-02-22 NOTE — Telephone Encounter (Signed)
No phone number listed in service.  On Dpr daughter's number, let ring 20 times no answer.  Trying to move pt's appt to Friday, Oct 18 @ 10:30 due to Lawson Fiscal had an emergency come up. Monday clinic is cancelled.

## 2018-02-25 ENCOUNTER — Ambulatory Visit: Payer: Medicare Other | Admitting: Nurse Practitioner

## 2018-02-25 NOTE — Telephone Encounter (Signed)
appt has been made 

## 2018-02-27 ENCOUNTER — Ambulatory Visit: Payer: Medicare Other | Admitting: Nurse Practitioner

## 2018-02-27 ENCOUNTER — Encounter: Payer: Self-pay | Admitting: Nurse Practitioner

## 2018-02-27 VITALS — BP 170/90 | HR 96 | Ht <= 58 in | Wt 138.0 lb

## 2018-02-27 DIAGNOSIS — Z79899 Other long term (current) drug therapy: Secondary | ICD-10-CM | POA: Diagnosis not present

## 2018-02-27 DIAGNOSIS — I480 Paroxysmal atrial fibrillation: Secondary | ICD-10-CM | POA: Diagnosis not present

## 2018-02-27 DIAGNOSIS — R55 Syncope and collapse: Secondary | ICD-10-CM

## 2018-02-27 DIAGNOSIS — Z7901 Long term (current) use of anticoagulants: Secondary | ICD-10-CM | POA: Diagnosis not present

## 2018-02-27 NOTE — Progress Notes (Signed)
CARDIOLOGY OFFICE NOTE  Date:  02/27/2018    Rita Myers Date of Birth: 1945/09/12 Medical Record #[id number]  PCP:  Darrow Bussing, MD  Cardiologist:  Kyra Manges    Chief Complaint  Patient presents with  . Hypertension  . Hyperlipidemia  . Atrial Fibrillation    Follow up visit - seen for Dr. Katrinka Blazing    History of Present Illness: Rita Myers is a 72 y.o. female who presents today for a follow up visit. Seen for Dr. Katrinka Blazing. Primarily follows with me.   She has a historyofHTN, HLD, prior irregularheartbeat and other co-morbiditiesas noted below.   Shepresented to the ED back in February of 2019 with awitnessed syncopal spell after having N/V and a large loose stool several hours after eating out. Got dizzy after vomiting and fell to the floor - brief period of just a few seconds of unconsciousness. At the hospital she was found to be in AF with RVR. Treated with Cardizem. Eliquis started as well - CHADSVASC of at least 3. Echo was obtained - had diastolic dysfunction and moderate MR - however noted in the record that Dr. Katrinka Blazing did not feel her MR was at this degree. To have outpatient event monitor - this turned ok.   I have seen her in follow up - no more syncope. Discussed with Dr. Katrinka Blazing about the echo and we will plan to just follow - would consider TEE if symptoms occur - neither of [place] have heard a murmur on exam. Last seen in April and she was doing ok.   Comes in today. Here withher husband. She has done well since last visit. No chest pain. Breathing is good. She has very rare palpitations. She brings in a list of BP readings which are just excellent at home. Tolerating her medicines well. She is walking regularly - feels good with this. Seeing PCP later this month. Overall, no real concerns noted.   Past Medical History:  Diagnosis Date  . A-fib (HCC) 07/11/2017  . Essential hypertension 07/11/2017  . Hyperlipemia   . Hypertension   . Hypokalemia  07/11/2017  . Nausea, vomiting, and diarrhea 07/11/2017  . Syncope, vasovagal 07/11/2017    History reviewed. No pertinent surgical history.   Medications: Current Meds  Medication Sig  . calcium citrate-vitamin D (CITRACAL+D) 315-200 MG-UNIT tablet Take 1 tablet by mouth 2 (two) times daily.  Marland Kitchen diltiazem (DILACOR XR) 240 MG 24 hr capsule Take 1 capsule (240 mg total) by mouth daily.  Marland Kitchen ELIQUIS 5 MG TABS tablet TAKE 1 TABLET BY MOUTH TWICE A DAY  . hydrochlorothiazide (MICROZIDE) 12.5 MG capsule Take 1 capsule (12.5 mg total) by mouth daily.  . hydroxypropyl methylcellulose / hypromellose (ISOPTO TEARS / GONIOVISC) 2.5 % ophthalmic solution Place 1 drop into both eyes 3 (three) times daily as needed for dry eyes.  Marland Kitchen loratadine (CLARITIN) 10 MG tablet Take 10 mg by mouth daily.  Marland Kitchen losartan (COZAAR) 50 MG tablet Take 1 tablet (50 mg total) by mouth daily.  . pravastatin (PRAVACHOL) 20 MG tablet Take 20 mg by mouth daily.  Marland Kitchen thiamine (VITAMIN B-1) 50 MG tablet Take 50 mg by mouth daily.     Allergies: No Known Allergies  Social History: The patient  reports that she has never smoked. She has never used smokeless tobacco. She reports that she does not drink alcohol or use drugs.   Family History: The patient's family history includes Diabetes in her brother, mother, and sister.  Review of Systems: Please see the history of present illness.   Otherwise, the review of systems is positive for none.   All other systems are reviewed and negative.   Physical Exam: VS:  BP (!) 170/90 (BP Location: Left Arm, Patient Position: Sitting, Cuff Size: Normal)   Pulse 96   Ht 4\' 3"  (1.295 m)   Wt 138 lb (62.6 kg)   BMI 37.30 kg/m  .  BMI Body mass index is 37.3 kg/m.  Wt Readings from Last 3 Encounters:  02/27/18 138 lb (62.6 kg)  09/04/17 133 lb (60.3 kg)  07/17/17 135 lb (61.2 kg)    General: Pleasant. Alert and in no acute distress.   HEENT: Normal.  Neck: Supple, no JVD, carotid  bruits, or masses noted.  Cardiac: Regular rate and rhythm. Again, I do not appreciate a murmur today. No edema.  Respiratory:  Lungs are clear to auscultation bilaterally with normal work of breathing.  GI: Soft and nontender.  MS: No deformity or atrophy. Gait and ROM intact.  Skin: Warm and dry. Color is normal.  Neuro:  Strength and sensation are intact and no gross focal deficits noted.  Psych: Alert, appropriate and with normal affect.   LABORATORY DATA:  EKG:  EKG is ordered today. This demonstrates NSR with PVCs.  Lab Results  Component Value Date   WBC 7.6 09/04/2017   HGB 13.9 09/04/2017   HCT 40.1 09/04/2017   PLT 190 09/04/2017   GLUCOSE 85 09/04/2017   ALT 26 07/12/2017   AST 32 07/12/2017   NA 140 09/04/2017   K 3.8 09/04/2017   CL 100 09/04/2017   CREATININE 0.72 09/04/2017   BUN 16 09/04/2017   CO2 25 09/04/2017   TSH 3.341 07/11/2017   INR 0.93 07/11/2017       BNP (last 3 results) No results for input(s): BNP in the last 8760 hours.  ProBNP (last 3 results) No results for input(s): PROBNP in the last 8760 hours.   Other Studies Reviewed Today:  EchoStudy Conclusions2/2019  - Left ventricle: The cavity size was mildly dilated. Systolic function was normal. The estimated ejection fraction was in the range of 50% to 55%. Wall motion was normal; there were no regional wall motion abnormalities. Doppler parameters are consistent with abnormal left ventricular relaxation (grade 1 diastolic dysfunction). - Aortic valve: There was trivial regurgitation. - Mitral valve: Calcified annulus. Mildly thickened leaflets . There was moderate to severe regurgitation, with multiple jets. - Left atrium: The atrium was moderately dilated. - Tricuspid valve: There was moderate regurgitation. - Pulmonary arteries: Systolic pressure was moderately increased. PA peak pressure: 58 mm Hg (S).   Assessment/Plan:  1. Prior episode of syncope -  this was felt to be vasovagal in nature and was in the setting of N/V and large loose stool. Her event monitor was ok. She has not had recurrence.   2. PAF - she remains in NSR by exam and by EKG. She remains on anticoagulation as well. No changes made. No problems noted.   3. Elevated CHADSVASC (3/4) - she remains on chronic anticoagulation - no problems noted. Checking surveillance labs today.    4. HTN - her BP is much better at home. No changes made. They do a very nice job of monitoring at home.   5. HLD - on statin therapy - her labs are checked by PCP - seeing later this month.   6. MR - noted previously and I have had prior discussion  with Dr. Katrinka Blazing who felt the echo was overread - felt like this was MVP. She continues to have no symptoms. I still do not appreciate murmur on exam. Will recheck her echo in February. No indication for TEE at this time. She continues to do very well clinically.    Current medicines are reviewed with the patient today.  The patient does not have concerns regarding medicines other than what has been noted above.  The following changes have been made:  See above.  Labs/ tests ordered today include:    Orders Placed This Encounter  Procedures  . Basic metabolic panel  . CBC  . EKG 12-Lead  . ECHOCARDIOGRAM COMPLETE     Disposition:   FU with Dr. Katrinka Blazing after the repeat echo - I am happy to see back as needed.   Patient is agreeable to this plan and will call if any problems develop in the interim.   SignedNorma Fredrickson, NP  02/27/2018 12:22 PM  [facility] [address] [place], [place]  [address] Phone: [phone] Fax: [phone]

## 2018-02-27 NOTE — Patient Instructions (Addendum)
We will be checking the following labs today - BMET & CBC  If you have labs (blood work) drawn today and your tests are completely normal, you will receive your results only by: Marland Kitchen MyChart Message (if you have MyChart) OR . A paper copy in the mail If you have any lab test that is abnormal or we need to change your treatment, we will call you to review the results.   Medication Instructions:    Continue with your current medicines.    If you need a refill on your cardiac medications before your next appointment, please call your pharmacy.     Testing/Procedures To Be Arranged:  Echo in February  Follow-Up:   See Dr. Katrinka Blazing a few days after the echo.     At [facility], you and your health needs are our priority.  As part of our continuing mission to provide you with exceptional heart care, we have created designated Provider Care Teams.  These Care Teams include your primary Cardiologist (physician) and Advanced Practice Providers (APPs -  Physician Assistants and Nurse Practitioners) who all work together to provide you with the care you need, when you need it.  Special Instructions:  . Keep up the good work!  Call the [facility] office at [phone] if you have any questions, problems or concerns.

## 2018-02-28 LAB — BASIC METABOLIC PANEL
BUN/Creatinine Ratio: 22 (ref 12–28)
BUN: 14 mg/dL (ref 8–27)
CO2: 25 mmol/L (ref 20–29)
Calcium: 10.4 mg/dL — ABNORMAL HIGH (ref 8.7–10.3)
Chloride: 100 mmol/L (ref 96–106)
Creatinine, Ser: 0.65 mg/dL (ref 0.57–1.00)
GFR calc Af Amer: 102 mL/min/{1.73_m2} (ref >59)
GFR calc non Af Amer: 88 mL/min/{1.73_m2} (ref >59)
Glucose: 92 mg/dL (ref 65–99)
Potassium: 4.5 mmol/L (ref 3.5–5.2)
Sodium: 144 mmol/L (ref 134–144)

## 2018-02-28 LAB — CBC
Hematocrit: 42.9 % (ref 34.0–46.6)
Hemoglobin: 14.7 g/dL (ref 11.1–15.9)
MCH: 31.3 pg (ref 26.6–33.0)
MCHC: 34.3 g/dL (ref 31.5–35.7)
MCV: 92 fL (ref 79–97)
Platelets: 212 10*3/uL (ref 150–450)
RBC: 4.69 x10E6/uL (ref 3.77–5.28)
RDW: 13.3 % (ref 12.3–15.4)
WBC: 6.3 10*3/uL (ref 3.4–10.8)

## 2018-03-06 NOTE — Telephone Encounter (Signed)
I have responded to the patient's question. Tereso Newcomer, PA-C    03/06/2018 3:47 PM

## 2018-04-09 ENCOUNTER — Telehealth: Payer: Self-pay | Admitting: Nurse Practitioner

## 2018-04-09 ENCOUNTER — Other Ambulatory Visit: Payer: Self-pay | Admitting: *Deleted

## 2018-04-09 MED ORDER — LOSARTAN POTASSIUM 100 MG PO TABS: 50.0000 mg | ORAL_TABLET | Freq: Every day | ORAL | 3 refills | Status: DC

## 2018-04-09 NOTE — Telephone Encounter (Signed)
° ° ° °  Pt c/o medication issue:  1. Name of Medication: losartan (COZAAR)   2. How are you currently taking this medication (dosage and times per day)?   3. Are you having a reaction (difficulty breathing--STAT)? n/a  4. What is your medication issue? Medication out of stock, can fill for 100mg , or prescribe alternative .

## 2018-04-09 NOTE — Telephone Encounter (Signed)
S/w pharmacist is aware give pt (100 mg ) tablet to take one half tablet (50 mg ) daily.  Sent in to pt's requested pharmacy. Medication list updated.

## 2018-04-09 NOTE — Telephone Encounter (Signed)
Would fill for the 100 mg and take only half please.   Rosalio MacadamiaLori C. Sylvana Bonk, RN, ANP-C [facility] [address] [place], [place]  [address] [phone]

## 2018-06-07 ENCOUNTER — Other Ambulatory Visit: Payer: Self-pay | Admitting: Family Medicine

## 2018-06-07 DIAGNOSIS — Z1231 Encounter for screening mammogram for malignant neoplasm of breast: Secondary | ICD-10-CM

## 2018-06-19 ENCOUNTER — Other Ambulatory Visit: Payer: Self-pay | Admitting: Nurse Practitioner

## 2018-06-20 ENCOUNTER — Other Ambulatory Visit (HOSPITAL_COMMUNITY): Payer: Medicare Other

## 2018-06-25 ENCOUNTER — Ambulatory Visit (HOSPITAL_COMMUNITY): Payer: Medicare Other | Attending: Cardiology

## 2018-06-25 DIAGNOSIS — I480 Paroxysmal atrial fibrillation: Secondary | ICD-10-CM

## 2018-06-25 DIAGNOSIS — Z79899 Other long term (current) drug therapy: Secondary | ICD-10-CM | POA: Insufficient documentation

## 2018-07-10 ENCOUNTER — Ambulatory Visit: Payer: Medicare Other | Admitting: Interventional Cardiology

## 2018-07-10 ENCOUNTER — Encounter: Payer: Self-pay | Admitting: Interventional Cardiology

## 2018-07-10 VITALS — BP 152/78 | HR 104 | Ht <= 58 in | Wt 139.8 lb

## 2018-07-10 DIAGNOSIS — R55 Syncope and collapse: Secondary | ICD-10-CM

## 2018-07-10 DIAGNOSIS — Z7901 Long term (current) use of anticoagulants: Secondary | ICD-10-CM

## 2018-07-10 DIAGNOSIS — I10 Essential (primary) hypertension: Secondary | ICD-10-CM

## 2018-07-10 DIAGNOSIS — I480 Paroxysmal atrial fibrillation: Secondary | ICD-10-CM

## 2018-07-10 DIAGNOSIS — I341 Nonrheumatic mitral (valve) prolapse: Secondary | ICD-10-CM | POA: Insufficient documentation

## 2018-07-10 NOTE — Patient Instructions (Signed)
Medication Instructions:  Your physician recommends that you continue on your current medications as directed. Please refer to the Current Medication list given to you today.  If you need a refill on your cardiac medications before your next appointment, please call your pharmacy.   Lab work: None ordered If you have labs (blood work) drawn today and your tests are completely normal, you will receive your results only by: . MyChart Message (if you have MyChart) OR . A paper copy in the mail If you have any lab test that is abnormal or we need to change your treatment, we will call you to review the results.  Testing/Procedures: None ordered  Follow-Up: At [facility], you and your health needs are our priority.  As part of our continuing mission to provide you with exceptional heart care, we have created designated Provider Care Teams.  These Care Teams include your primary Cardiologist (physician) and Advanced Practice Providers (APPs -  Physician Assistants and Nurse Practitioners) who all work together to provide you with the care you need, when you need it. . You will need a follow up appointment in 1 year.  Please call our office 2 months in advance to schedule this appointment.  You may see Henry W Smith III, MD or one of the following Advanced Practice Providers on your designated Care Team:   . Lori Gerhardt, NP . Laura Ingold, NP . Jill McDaniel, NP   Any Other Special Instructions Will Be Listed Below (If Applicable).  

## 2018-07-10 NOTE — Progress Notes (Signed)
Cardiology Office Note:    Date:  07/10/2018   ID:  Rita Myers, DOB Feb 20, 1946, MRN [id number]  PCP:  Darrow Bussing, MD  Cardiologist:  Lesleigh Noe, MD   Referring MD: Darrow Bussing, MD   Chief Complaint  Patient presents with  . Atrial Fibrillation  . Cardiac Valve Problem    History of Present Illness:    Rita Myers is a 73 y.o. female with a hx of nausea related syncope, paroxysmal atrial fibrillation, hypertension, myxomatous mitral valve with regurgitation, and hyperlipidemia.  [facility] is 2.  The patient is anxious whenever she is in the doctor's office.  She is accompanied by her husband today.  They brought an extensive log of documented blood pressures and heart rates at home.  All are normal with blood pressures less than 130/80 mmHg and heart rates usually in the 70s.  We discussed the results of the echocardiogram.  Contrary to the echo done a year ago, mitral regurgitation is now felt to be mild.  She does have degenerative mitral valve changes but no significant MR at this time.  She has not had palpitations, syncope, orthopnea, PND, chest pain, or medication side effects.   Past Medical History:  Diagnosis Date  . A-fib (HCC) 07/11/2017  . Essential hypertension 07/11/2017  . Hyperlipemia   . Hypertension   . Hypokalemia 07/11/2017  . Nausea, vomiting, and diarrhea 07/11/2017  . Syncope, vasovagal 07/11/2017    History reviewed. No pertinent surgical history.  Current Medications: Current Meds  Medication Sig  . calcium citrate-vitamin D (CITRACAL+D) 315-200 MG-UNIT tablet Take 1 tablet by mouth 2 (two) times daily.  Marland Kitchen diltiazem (DILACOR XR) 240 MG 24 hr capsule Take 1 capsule (240 mg total) by mouth daily.  Marland Kitchen ELIQUIS 5 MG TABS tablet TAKE 1 TABLET BY MOUTH TWICE A DAY  . hydrochlorothiazide (MICROZIDE) 12.5 MG capsule TAKE 1 CAPSULE BY MOUTH EVERY DAY  . hydroxypropyl methylcellulose / hypromellose (ISOPTO TEARS / GONIOVISC) 2.5 % ophthalmic  solution Place 1 drop into both eyes 3 (three) times daily as needed for dry eyes.  Marland Kitchen loratadine (CLARITIN) 10 MG tablet Take 10 mg by mouth daily.  Marland Kitchen losartan (COZAAR) 100 MG tablet Take 0.5 tablets (50 mg total) by mouth daily.  . pravastatin (PRAVACHOL) 20 MG tablet Take 20 mg by mouth daily.  Marland Kitchen thiamine (VITAMIN B-1) 50 MG tablet Take 50 mg by mouth daily.     Allergies:   Patient has no known allergies.   Social History   Socioeconomic History  . Marital status: Married    Spouse name: Not on file  . Number of children: Not on file  . Years of education: Not on file  . Highest education level: Not on file  Occupational History  . Not on file  Social Needs  . Financial resource strain: Not on file  . Food insecurity:    Worry: Not on file    Inability: Not on file  . Transportation needs:    Medical: Not on file    Non-medical: Not on file  Tobacco Use  . Smoking status: Never Smoker  . Smokeless tobacco: Never Used  Substance and Sexual Activity  . Alcohol use: No    Frequency: Never  . Drug use: No  . Sexual activity: Not on file  Lifestyle  . Physical activity:    Days per week: Not on file    Minutes per session: Not on file  . Stress: Not on file  Relationships  . Social connections:    Talks on phone: Not on file    Gets together: Not on file    Attends religious service: Not on file    Active member of club or organization: Not on file    Attends meetings of clubs or organizations: Not on file    Relationship status: Not on file  Other Topics Concern  . Not on file  Social History Narrative  . Not on file     Family History: The patient's family history includes Diabetes in her brother, mother, and sister.  ROS:   Please see the history of present illness.    Anxiety.  Snoring positive according to her husband.  All other systems reviewed and are negative.  EKGs/Labs/Other Studies Reviewed:    The following studies were reviewed today: 2D  Doppler echocardiography June 25, 2018   IMPRESSIONS    1. The left ventricle has normal systolic function of 55-60%. The cavity size was normal. Left ventricular diastolic parameters were normal Elevated left ventricular end-diastolic pressure.  2. The right ventricle has normal systolic function. The cavity was normal. There is no increase in right ventricular wall thickness. Right ventricular systolic pressure is moderately elevated with an estimated pressure of 43.6 mmHg.  3. The mitral valve is degenerative. There is moderate thickening and mild calcification.  4. The tricuspid valve is normal in structure.  5. The aortic valve is tricuspid There is mild thickening of the aortic valve. Aortic valve regurgitation is mild by color flow Doppler.  6. The pulmonic valve was normal in structure.   EKG:  EKG performed on 02/27/2018 demonstrates sinus rhythm, PACs, prominent voltage, and left axis deviation.  Recent Labs: 07/11/2017: TSH 3.341 07/12/2017: ALT 26; Magnesium 2.3 02/27/2018: BUN 14; Creatinine, Ser 0.65; Hemoglobin 14.7; Platelets 212; Potassium 4.5; Sodium 144  Recent Lipid Panel No results found for: CHOL, TRIG, HDL, CHOLHDL, VLDL, LDLCALC, LDLDIRECT  Physical Exam:    VS:  BP (!) 152/78   Pulse (!) 104   Ht 4\' 3"  (1.295 m)   Wt 139 lb 12.8 oz (63.4 kg)   SpO2 98%   BMI 37.79 kg/m     Wt Readings from Last 3 Encounters:  07/10/18 139 lb 12.8 oz (63.4 kg)  02/27/18 138 lb (62.6 kg)  09/04/17 133 lb (60.3 kg)     GEN: Moderate obesity.. No acute distress HEENT: Normal NECK: No JVD. LYMPHATICS: No lymphadenopathy CARDIAC: RRR.  No significant murmur is audible including in the left axillary region.  Murmur, no gallop, no edema VASCULAR: 2+ bilateral radial pulses, no bruits RESPIRATORY:  Clear to auscultation without rales, wheezing or rhonchi  ABDOMEN: Soft, non-tender, non-distended, No pulsatile mass, MUSCULOSKELETAL: No deformity  SKIN: Warm and  dry NEUROLOGIC:  Alert and oriented x 3 PSYCHIATRIC:  Normal affect   ASSESSMENT:    1. Paroxysmal atrial fibrillation (HCC)   2. Essential hypertension   3. Syncope, vasovagal   4. Myxomatous mitral valve   5. Chronic anticoagulation    PLAN:    In order of problems listed above:  1. Currently in sinus rhythm.  On anticoagulation therapy with Eliquis without complications.  Chads Vasc equals 2. 2. Elevated blood pressure in office.  Multiple documentations of normal blood pressure at home. 3. No recurrence of syncope over the past year. 4. Myxomatous mitral valve but without significant mitral regurgitation either on clinical exam or echo. 5. Continue apixaban at current dose.  Clinical follow-up in 1 year.  Continue same medical regimen as noted above.  Call if rapid heartbeat, shortness of breath, or other heart symptoms.   Medication Adjustments/Labs and Tests Ordered: Current medicines are reviewed at length with the patient today.  Concerns regarding medicines are outlined above.  No orders of the defined types were placed in this encounter.  No orders of the defined types were placed in this encounter.   Patient Instructions  Medication Instructions:  Your physician recommends that you continue on your current medications as directed. Please refer to the Current Medication list given to you today.  If you need a refill on your cardiac medications before your next appointment, please call your pharmacy.   Lab work: None ordered If you have labs (blood work) drawn today and your tests are completely normal, you will receive your results only by: Marland Kitchen MyChart Message (if you have MyChart) OR . A paper copy in the mail If you have any lab test that is abnormal or we need to change your treatment, we will call you to review the results.  Testing/Procedures: None ordered  Follow-Up: At [facility], you and your health needs are our priority.  As part of our  continuing mission to provide you with exceptional heart care, we have created designated Provider Care Teams.  These Care Teams include your primary Cardiologist (physician) and Advanced Practice Providers (APPs -  Physician Assistants and Nurse Practitioners) who all work together to provide you with the care you need, when you need it. . You will need a follow up appointment in 1 year.  Please call our office 2 months in advance to schedule this appointment.  You may see Lesleigh Noe, MD or one of the following Advanced Practice Providers on your designated Care Team:   . Norma Fredrickson, NP . Nada Boozer, NP . Georgie Chard, NP   Any Other Special Instructions Will Be Listed Below (If Applicable).     Signed, Lesleigh Noe, MD  07/10/2018 12:58 PM     [facility]

## 2018-07-24 ENCOUNTER — Ambulatory Visit: Payer: Medicare Other

## 2018-09-06 ENCOUNTER — Other Ambulatory Visit: Payer: Self-pay | Admitting: Interventional Cardiology

## 2018-09-06 NOTE — Telephone Encounter (Signed)
Age 73, weight 63.4kg, SCr 0.65 on 02/27/18, refill appropriate

## 2019-06-11 IMAGING — CT CT HEAD W/O CM
3 series · 16 of 46 positions shown, 19 images · non-contrast
Comparison: None.

CLINICAL DATA: Altered level of consciousness.  Weakness.

EXAM:
CT HEAD WITHOUT CONTRAST
TECHNIQUE: Contiguous axial images were obtained from the base of the skull
through the vertex without intravenous contrast.

[Series 2: head wo · axial · 0.47mm/px · z∈[-63,+57]mm · 10 of 29 slices shown, 13 images]
[im 3/29  brain]
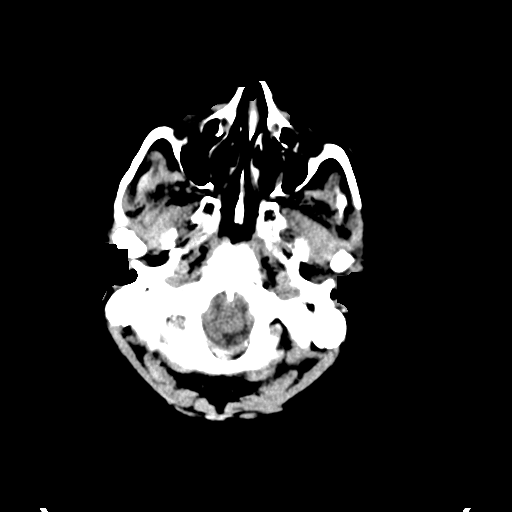
[im 3/29  bone]
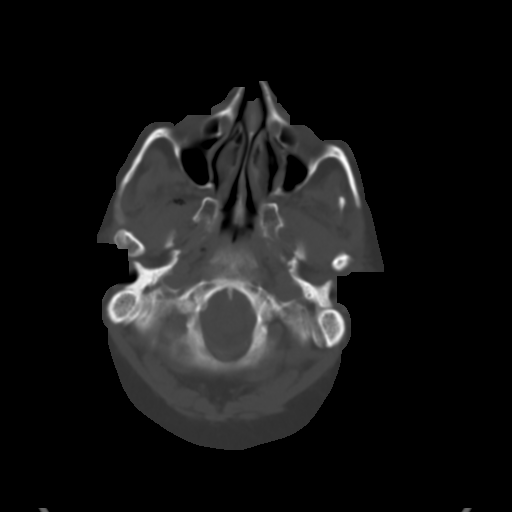
[im 6/29  brain]
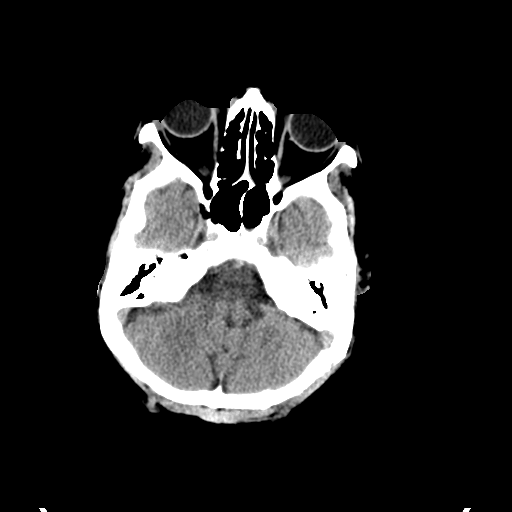
[im 8/29  brain]
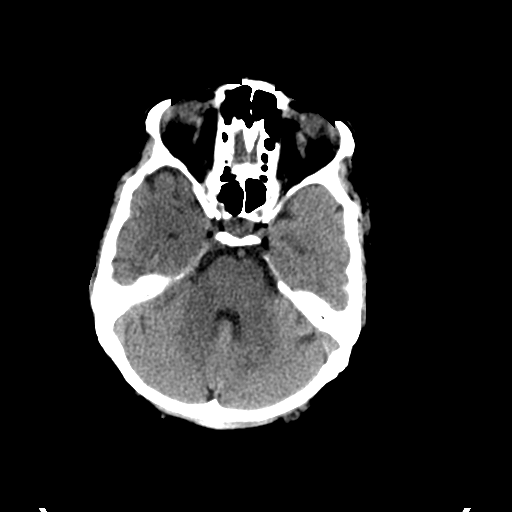
[im 11/29  brain]
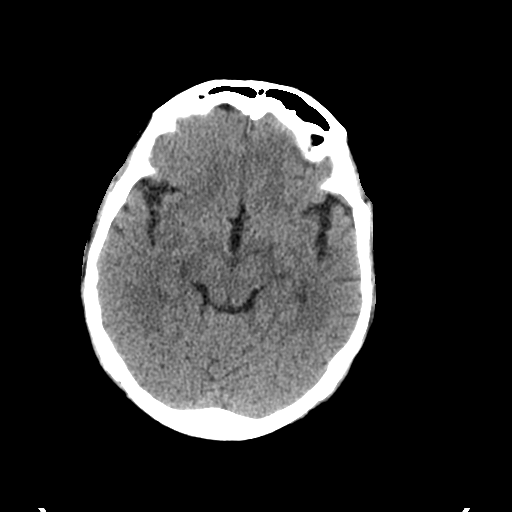
[im 14/29  brain]
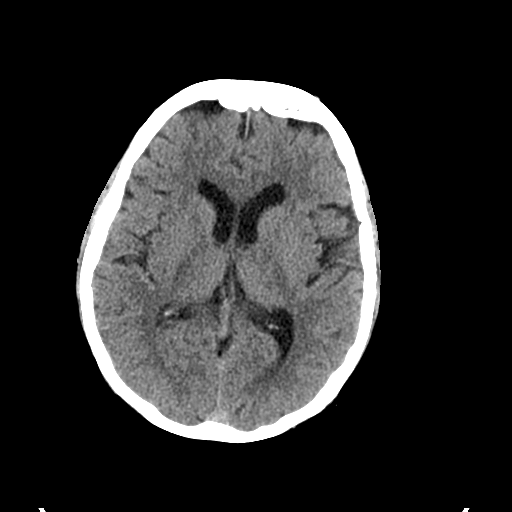
[im 14/29  bone]
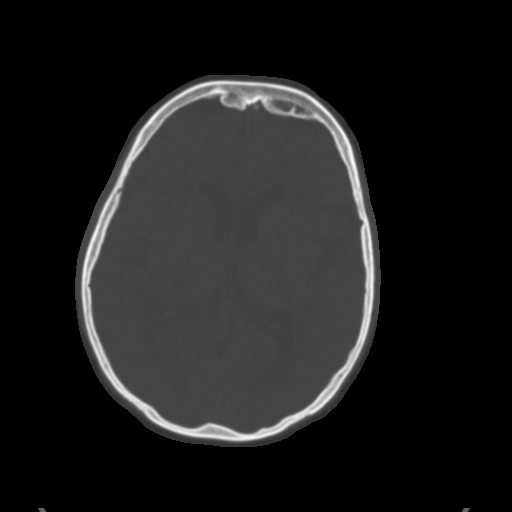
[im 16/29  brain]
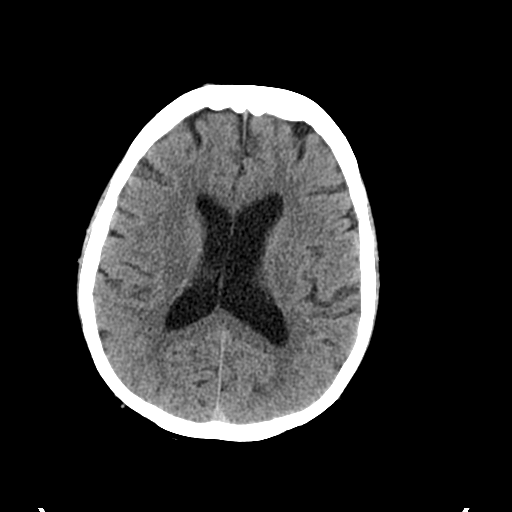
[im 19/29  brain]
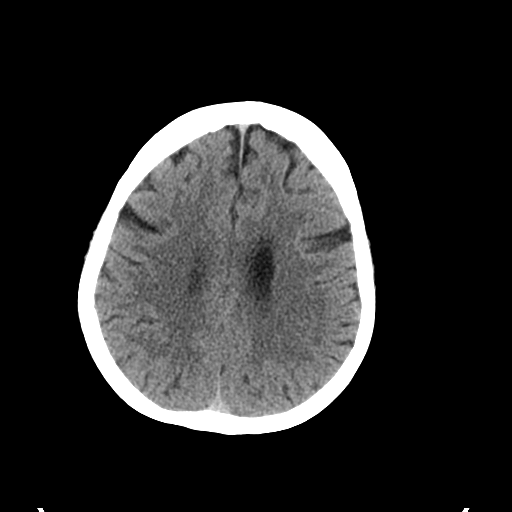
[im 22/29  brain]
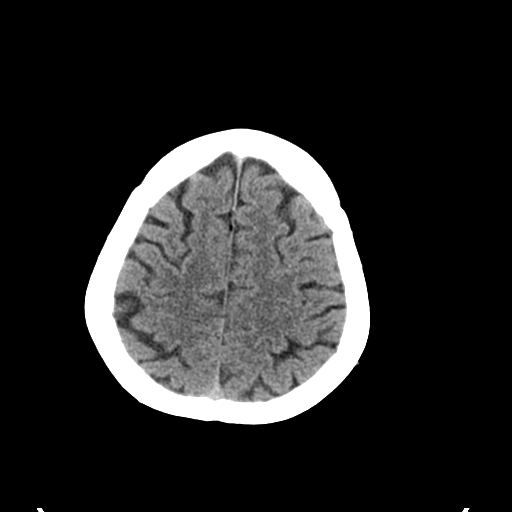
[im 24/29  brain]
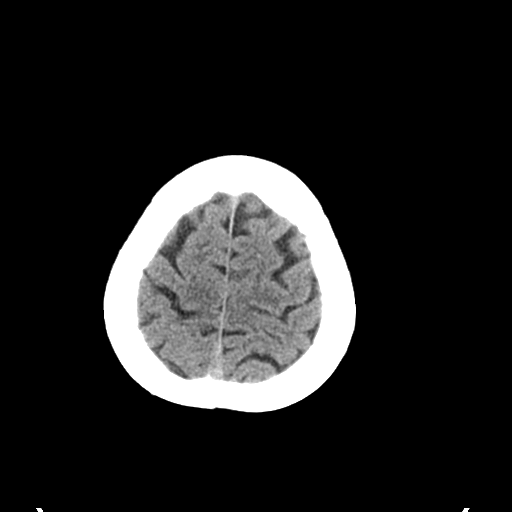
[im 24/29  bone]
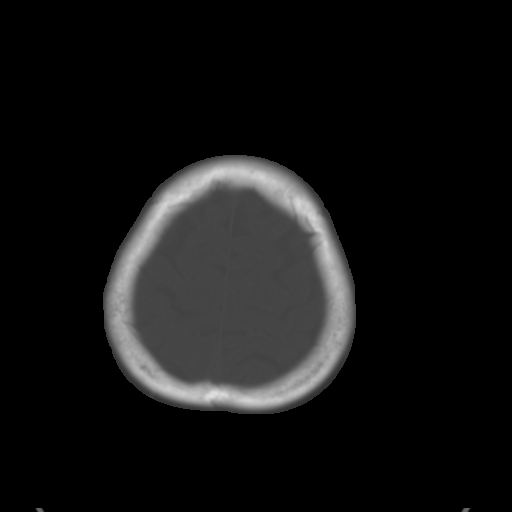
[im 27/29  brain]
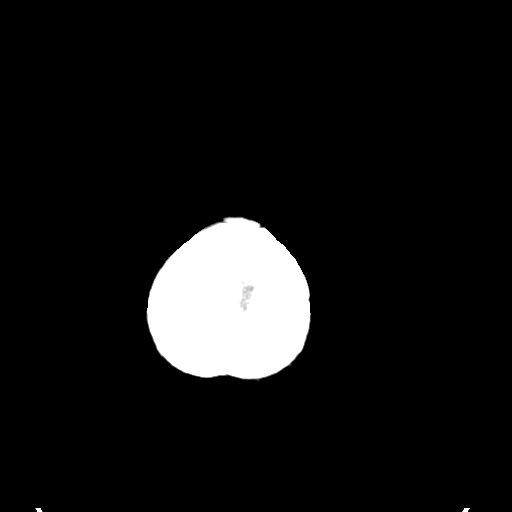

[Series 4: coronal soft tissue · coronal · 0.27mm/px · 3 of 57 slices shown]
[im 19/57  brain]
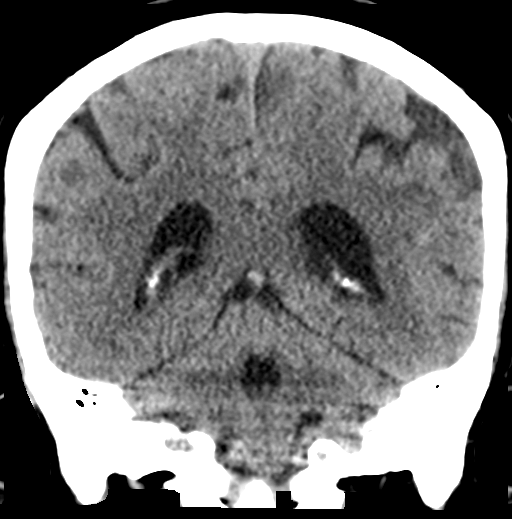
[im 25/57  brain]
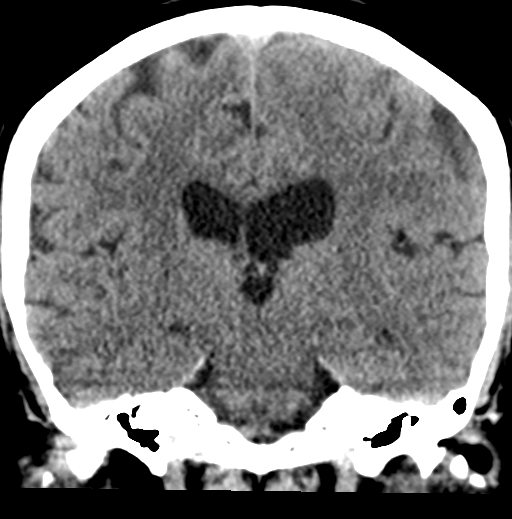
[im 32/57  brain]
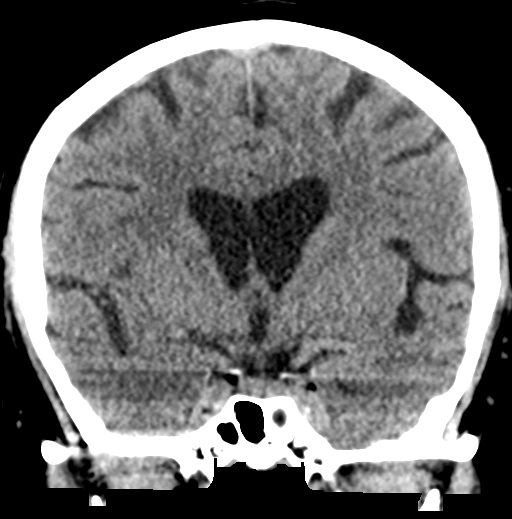

[Series 5: sagittal soft tissue · sagittal · 0.28mm/px · 3 of 48 slices shown]
[im 16/48  brain]
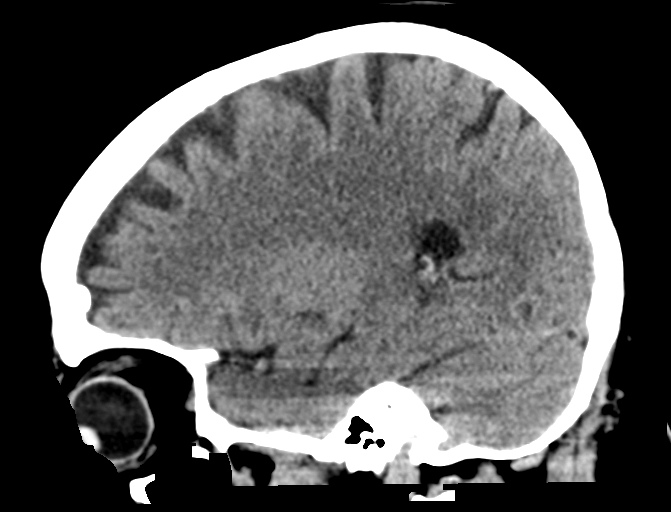
[im 24/48  brain]
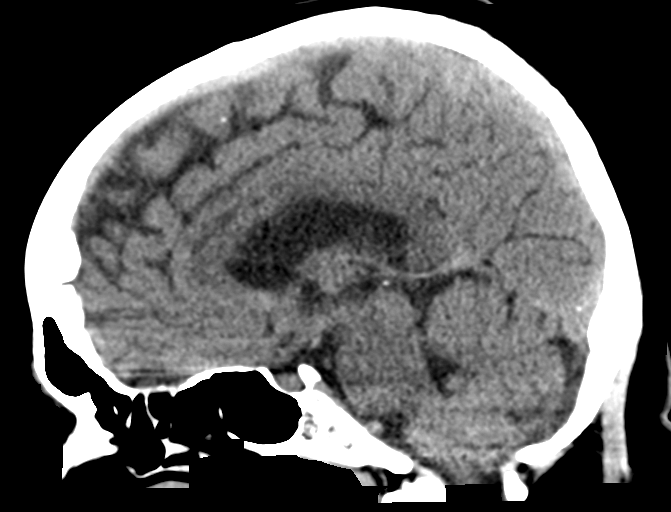
[im 32/48  brain]
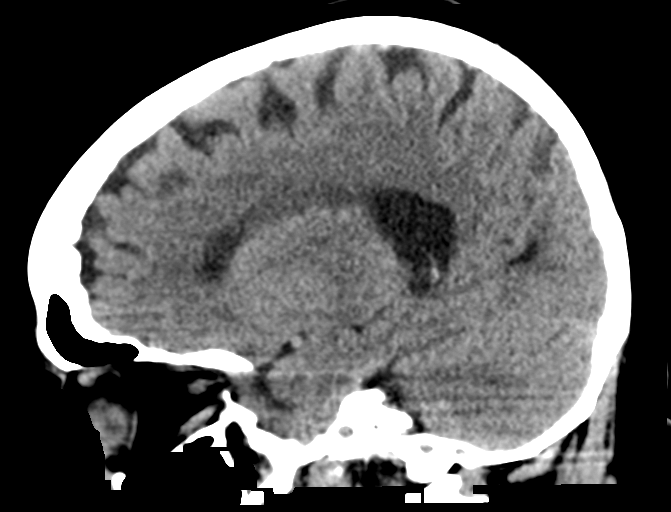

[16 of 46 positions shown; findings below may reference images not displayed]

FINDINGS: Brain: No acute intracranial abnormality. Specifically, no
hemorrhage, hydrocephalus, mass lesion, acute infarction, or
significant intracranial injury.

Vascular: No hyperdense vessel or unexpected calcification.

Skull: No acute calvarial abnormality.

Sinuses/Orbits: Visualized paranasal sinuses and mastoids clear.
Orbital soft tissues unremarkable.

Other: None
IMPRESSION: No acute intracranial abnormality.

## 2019-06-11 IMAGING — CR DG CHEST 2V
2 series · 2 of 2 positions shown · non-contrast
Comparison: None.

CLINICAL DATA: 71-year-old female with weakness.

EXAM:
CHEST  2 VIEW

[w chest lat]
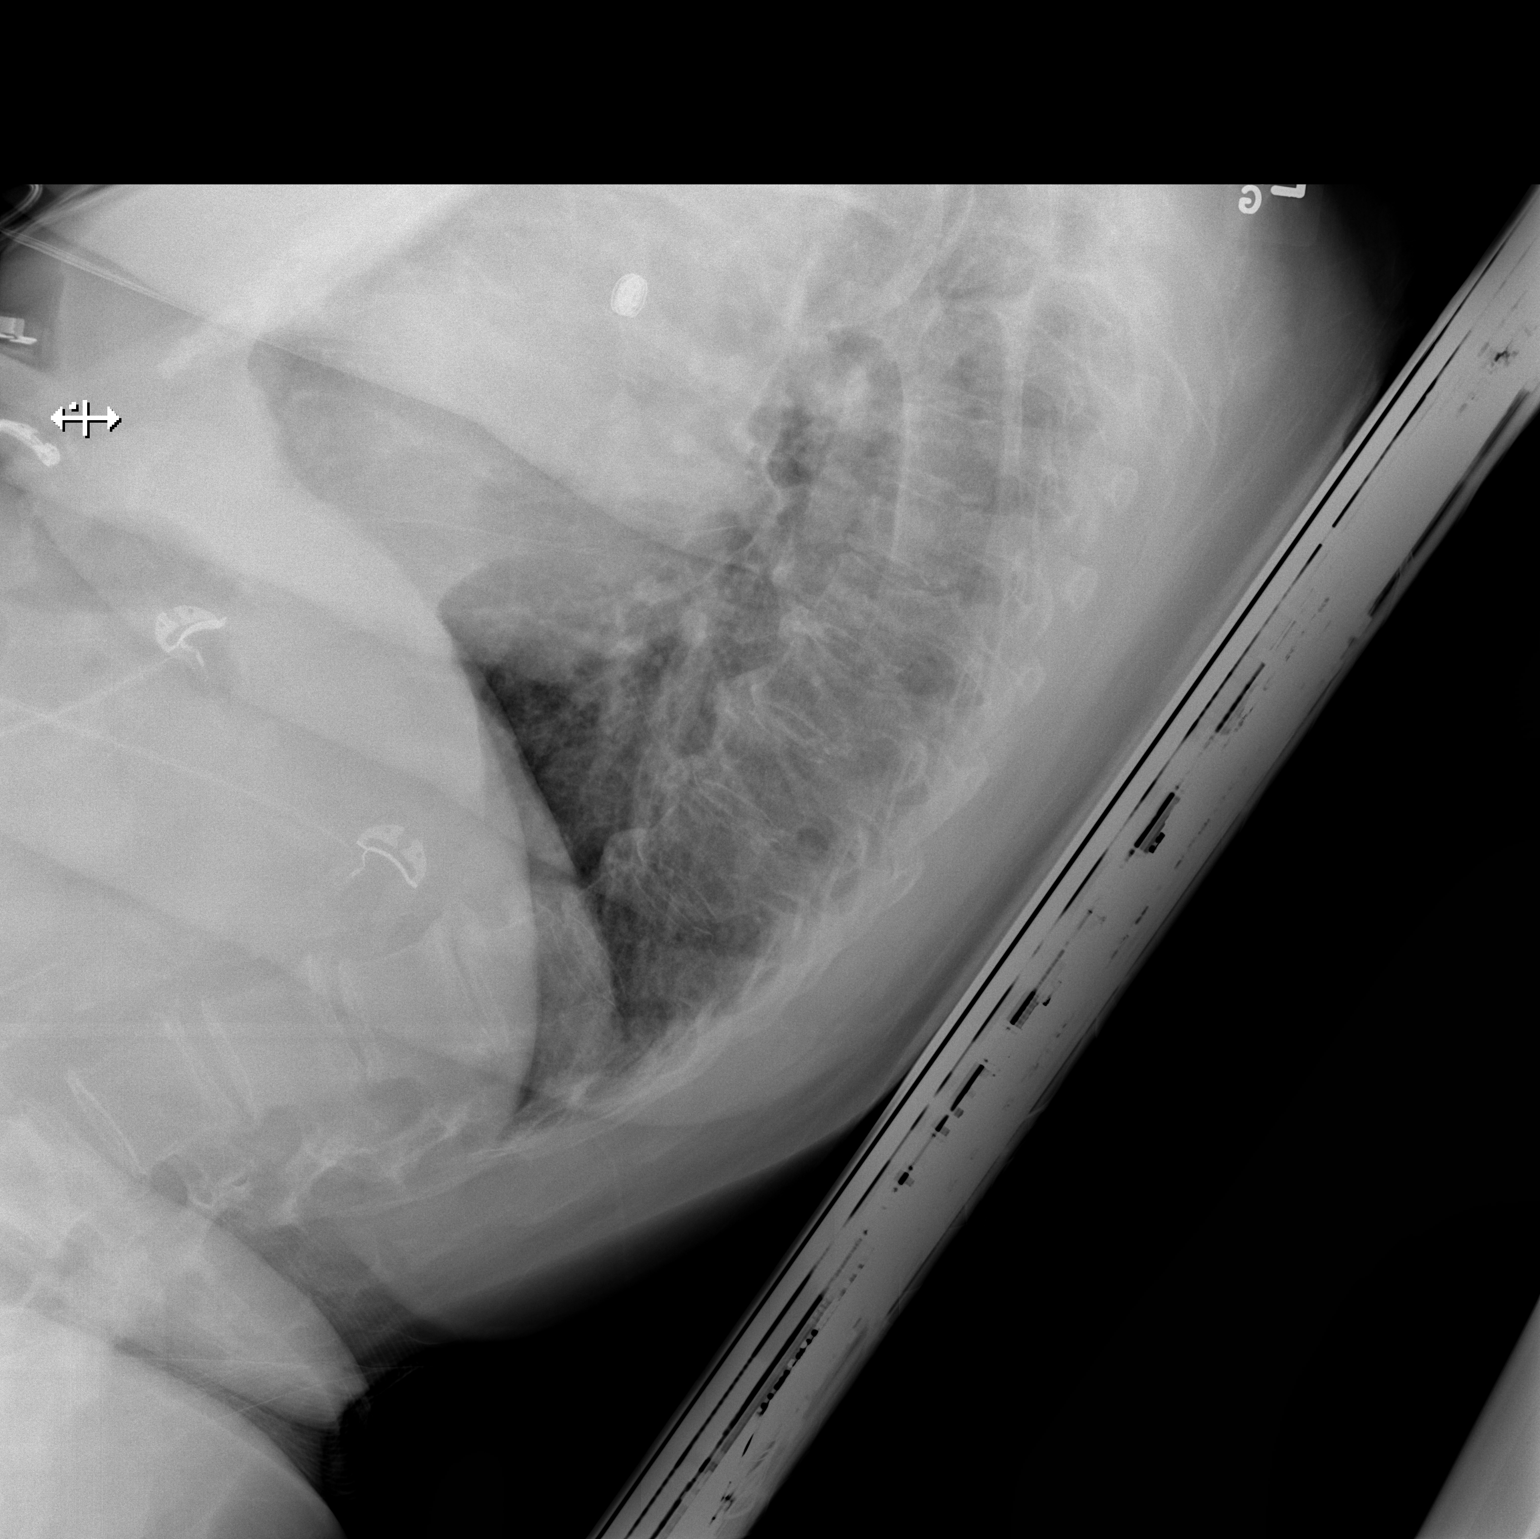

[x chest ap]
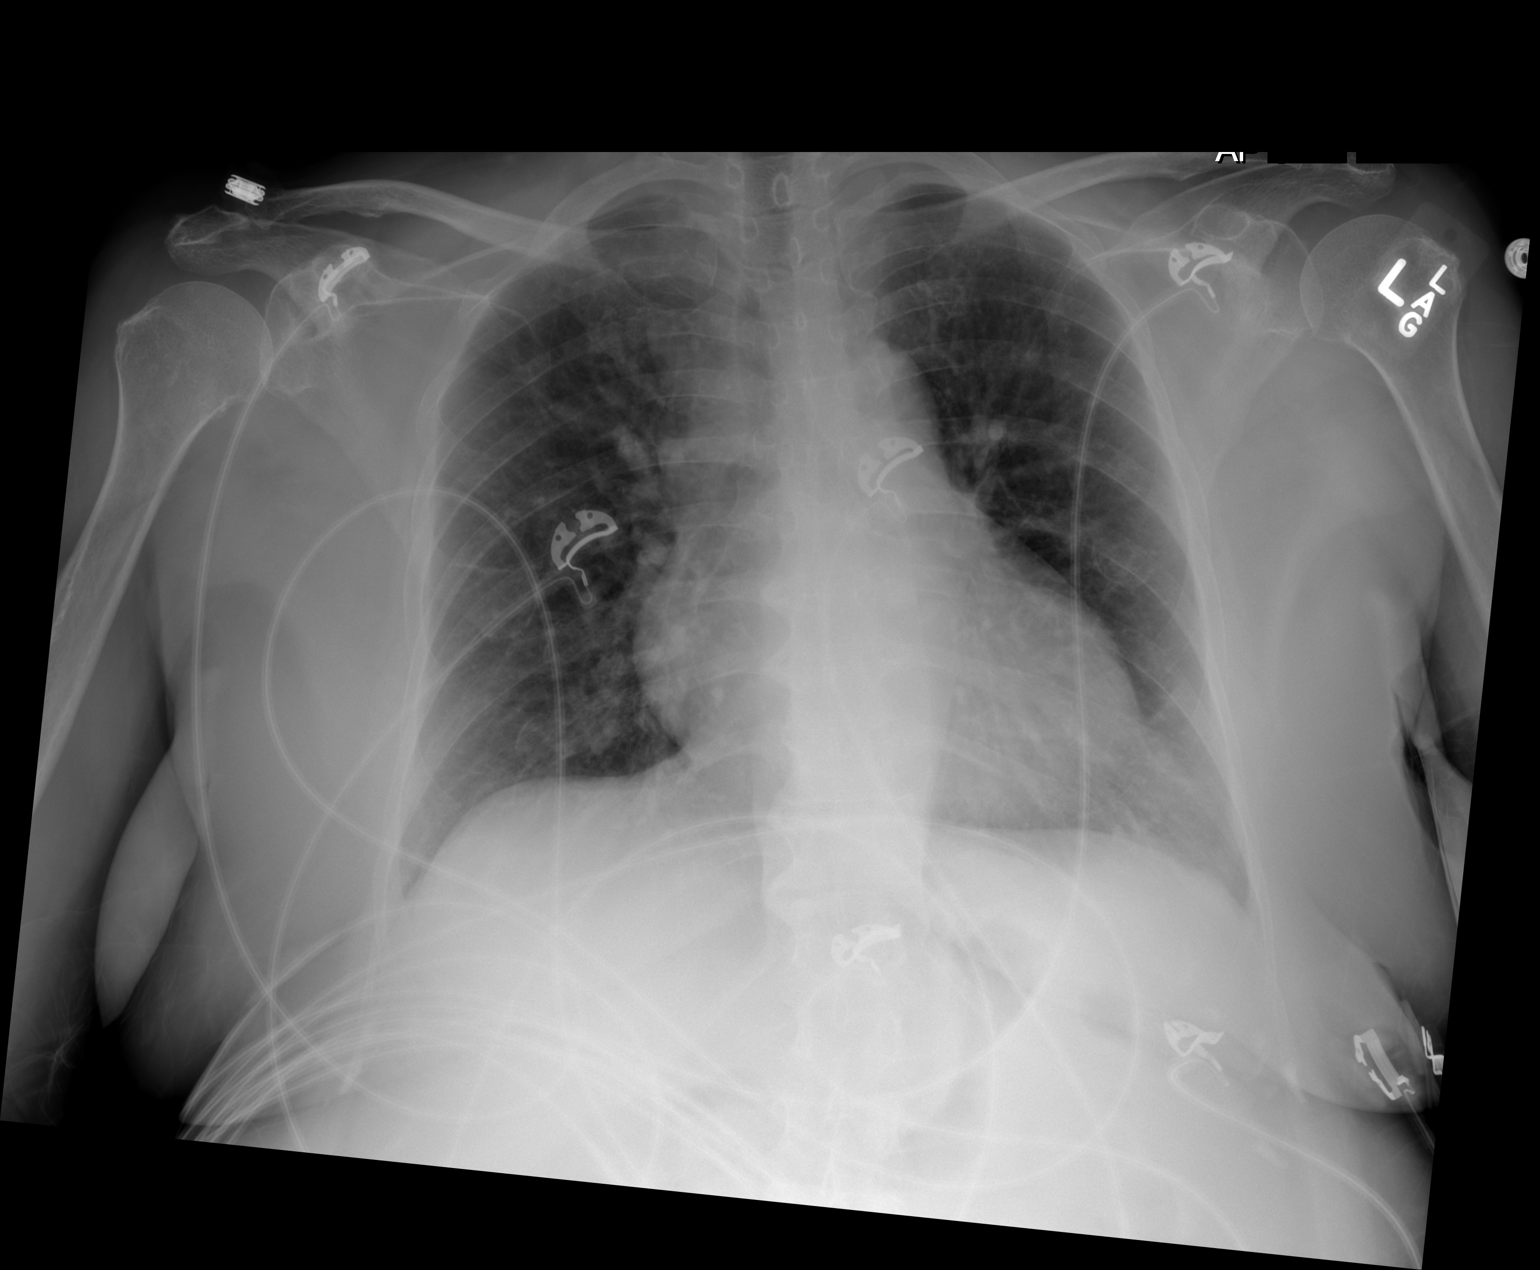

[2 of 2 positions shown; findings below may reference images not displayed]

FINDINGS: Minimal bibasilar dependent atelectatic changes. No focal
consolidation, pleural effusion, or pneumothorax. Mild cardiomegaly.
Degenerative changes of the spine. No acute osseous pathology.
IMPRESSION: No active cardiopulmonary disease.

## 2019-06-29 ENCOUNTER — Ambulatory Visit: Payer: Medicare Other | Attending: Internal Medicine

## 2019-06-29 DIAGNOSIS — Z23 Encounter for immunization: Secondary | ICD-10-CM | POA: Insufficient documentation

## 2019-06-29 NOTE — Progress Notes (Signed)
   Covid-19 Vaccination Clinic  Name:  Paden Kuras    MRN: [id number] DOB: 12/04/1945  06/29/2019  Ms. Mehl was observed post Covid-19 immunization for 15 minutes without incidence. She was provided with Vaccine Information Sheet and instruction to access the V-Safe system.   Ms. Starkey was instructed to call 911 with any severe reactions post vaccine: Marland Kitchen Difficulty breathing  . Swelling of your face and throat  . A fast heartbeat  . A bad rash all over your body  . Dizziness and weakness    Immunizations Administered    Name Date Dose VIS Date Route   Pfizer COVID-19 Vaccine 06/29/2019 11:03 AM 0.3 mL 04/25/2019 Intramuscular   Manufacturer: [employer], [employer]   Lot: [id number]   NDC: [address]-2

## 2019-07-22 ENCOUNTER — Ambulatory Visit: Payer: Medicare Other | Attending: Internal Medicine

## 2019-07-22 DIAGNOSIS — Z23 Encounter for immunization: Secondary | ICD-10-CM

## 2019-07-22 NOTE — Progress Notes (Signed)
   Covid-19 Vaccination Clinic  Name:  Zakirah Weingart    MRN: [id number] DOB: [birth year]  07/22/2019  Ms. Guizar was observed post Covid-19 immunization for 15 minutes without incident. She was provided with Vaccine Information Sheet and instruction to access the V-Safe system.   Ms. Fleischhacker was instructed to call 911 with any severe reactions post vaccine: Marland Kitchen Difficulty breathing  . Swelling of face and throat  . A fast heartbeat  . A bad rash all over body  . Dizziness and weakness   Immunizations Administered    Name Date Dose VIS Date Route   Pfizer COVID-19 Vaccine 07/22/2019  1:27 PM 0.3 mL 04/25/2019 Intramuscular   Manufacturer: [employer], [employer]   Lot: [id number]   NDC: [address]-3

## 2019-07-31 NOTE — Progress Notes (Deleted)
Cardiology Office Note:    Date:  07/31/2019   ID:  Rita Myers, DOB October 22, 1945, MRN [id number]  PCP:  Lujean Amel, MD  Cardiologist:  Sinclair Grooms, MD   Referring MD: Lujean Amel, MD   No chief complaint on file.   History of Present Illness:    Rita Myers is a 74 y.o. female with a hx of syncope, paroxysmal atrial fibrillation, hypertension, myxomatous mitral valve with regurgitation, and hyperlipidemia.  [facility] V[facility] is 2.  ***  Past Medical History:  Diagnosis Date  . A-fib ([facility]) 07/11/2017  . Essential hypertension 07/11/2017  . Hyperlipemia   . Hypertension   . Hypokalemia 07/11/2017  . Nausea, vomiting, and diarrhea 07/11/2017  . Syncope, vasovagal 07/11/2017    No past surgical history on file.  Current Medications: No outpatient medications have been marked as taking for the 08/01/19 encounter (Appointment) with Belva Crome, MD.     Allergies:   Patient has no known allergies.   Social History   Socioeconomic History  . Marital status: Married    Spouse name: Not on file  . Number of children: Not on file  . Years of education: Not on file  . Highest education level: Not on file  Occupational History  . Not on file  Tobacco Use  . Smoking status: Never Smoker  . Smokeless tobacco: Never Used  Substance and Sexual Activity  . Alcohol use: No  . Drug use: No  . Sexual activity: Not on file  Other Topics Concern  . Not on file  Social History Narrative  . Not on file   Social Determinants of Health   Financial Resource Strain:   . Difficulty of Paying Living Expenses:   Food Insecurity:   . Worried About Charity fundraiser in the Last Year:   . Arboriculturist in the Last Year:   Transportation Needs:   . Film/video editor (Medical):   Marland Kitchen Lack of Transportation (Non-Medical):   Physical Activity:   . Days of Exercise per Week:   . Minutes of Exercise per Session:   Stress:   . Feeling of Stress :   Social Connections:     . Frequency of Communication with Friends and Family:   . Frequency of Social Gatherings with Friends and Family:   . Attends Religious Services:   . Active Member of Clubs or Organizations:   . Attends Archivist Meetings:   Marland Kitchen Marital Status:      Family History: The patient's family history includes Diabetes in her brother, mother, and sister.  ROS:   Please see the history of present illness.    *** All other systems reviewed and are negative.  EKGs/Labs/Other Studies Reviewed:    The following studies were reviewed today: ***  EKG:  EKG ***  Recent Labs: No results found for requested labs within last 8760 hours.  Recent Lipid Panel No results found for: CHOL, TRIG, HDL, CHOLHDL, VLDL, LDLCALC, LDLDIRECT  Physical Exam:    VS:  There were no vitals taken for this visit.    Wt Readings from Last 3 Encounters:  07/10/18 139 lb 12.8 oz (63.4 kg)  02/27/18 138 lb (62.6 kg)  09/04/17 133 lb (60.3 kg)     GEN: ***. No acute distress HEENT: Normal NECK: No JVD. LYMPHATICS: No lymphadenopathy CARDIAC: *** RRR without murmur, gallop, or edema. VASCULAR: *** Normal Pulses. No bruits. RESPIRATORY:  Clear to auscultation without rales, wheezing or  rhonchi  ABDOMEN: Soft, non-tender, non-distended, No pulsatile mass, MUSCULOSKELETAL: No deformity  SKIN: Warm and dry NEUROLOGIC:  Alert and oriented x 3 PSYCHIATRIC:  Normal affect   ASSESSMENT:    1. Myxomatous mitral valve   2. Paroxysmal atrial fibrillation (HCC)   3. Essential hypertension   4. Chronic anticoagulation   5. Educated about COVID-19 virus infection    PLAN:    In order of problems listed above:  1. ***   Medication Adjustments/Labs and Tests Ordered: Current medicines are reviewed at length with the patient today.  Concerns regarding medicines are outlined above.  No orders of the defined types were placed in this encounter.  No orders of the defined types were placed in this  encounter.   There are no Patient Instructions on file for this visit.   Signed, Lesleigh Noe, MD  07/31/2019 9:40 PM    [facility]

## 2019-08-01 ENCOUNTER — Other Ambulatory Visit: Payer: Self-pay

## 2019-08-01 ENCOUNTER — Encounter: Payer: Self-pay | Admitting: Interventional Cardiology

## 2019-08-01 ENCOUNTER — Ambulatory Visit (INDEPENDENT_AMBULATORY_CARE_PROVIDER_SITE_OTHER): Payer: Medicare Other | Admitting: Interventional Cardiology

## 2019-08-01 ENCOUNTER — Ambulatory Visit: Payer: Medicare Other | Admitting: Interventional Cardiology

## 2019-08-01 VITALS — BP 164/84 | HR 117 | Ht <= 58 in | Wt 137.4 lb

## 2019-08-01 DIAGNOSIS — I10 Essential (primary) hypertension: Secondary | ICD-10-CM

## 2019-08-01 DIAGNOSIS — Z7901 Long term (current) use of anticoagulants: Secondary | ICD-10-CM

## 2019-08-01 DIAGNOSIS — E785 Hyperlipidemia, unspecified: Secondary | ICD-10-CM

## 2019-08-01 DIAGNOSIS — Z7189 Other specified counseling: Secondary | ICD-10-CM

## 2019-08-01 DIAGNOSIS — I480 Paroxysmal atrial fibrillation: Secondary | ICD-10-CM | POA: Diagnosis not present

## 2019-08-01 DIAGNOSIS — I340 Nonrheumatic mitral (valve) insufficiency: Secondary | ICD-10-CM

## 2019-08-01 NOTE — Progress Notes (Signed)
Cardiology Office Note:    Date:  08/01/2019   ID:  Rita Myers, DOB 02/14/1946, MRN [id number]  PCP:  Lujean Amel, MD  Cardiologist:  Sinclair Grooms, MD   Referring MD: Lujean Amel, MD   Chief Complaint  Patient presents with  . Atrial Fibrillation  . Cardiac Valve Problem    History of Present Illness:    Rita Myers is a 74 y.o. female with a hx of nausea related syncope, paroxysmal atrial fibrillation, hypertension, myxomatous mitral valve with regurgitation, and hyperlipidemia.  [facility] is 2.  Being seen today with no particular complaints.  This is follow-up for prior atrial fibrillation and mitral valve disorder.  In the interim since the last visit, she has not had significant palpitations.  She denies dyspnea on exertion, orthopnea, and PND.  She has not noticed edema.  There have been no neurological complaints.  On apixaban, she has not had blood in the urine or stool.  She is somewhat upset and nervous anytime she is in the doctor's office, especially under circumstances where her husband is not able to be with her (as is the case today).  Past Medical History:  Diagnosis Date  . A-fib ([place]) 07/11/2017  . Essential hypertension 07/11/2017  . Hyperlipemia   . Hypertension   . Hypokalemia 07/11/2017  . Nausea, vomiting, and diarrhea 07/11/2017  . Syncope, vasovagal 07/11/2017    History reviewed. No pertinent surgical history.  Current Medications: Current Meds  Medication Sig  . calcium citrate-vitamin D (CITRACAL+D) 315-200 MG-UNIT tablet Take 1 tablet by mouth 2 (two) times daily.  Marland Kitchen diltiazem (DILACOR XR) 240 MG 24 hr capsule Take 1 capsule (240 mg total) by mouth daily.  Marland Kitchen ELIQUIS 5 MG TABS tablet TAKE 1 TABLET BY MOUTH TWICE A DAY  . hydrochlorothiazide (MICROZIDE) 12.5 MG capsule TAKE 1 CAPSULE BY MOUTH EVERY DAY  . loratadine (CLARITIN) 10 MG tablet Take 10 mg by mouth daily.  Marland Kitchen losartan (COZAAR) 50 MG tablet Take 50 mg by mouth daily.  .  pravastatin (PRAVACHOL) 20 MG tablet Take 20 mg by mouth daily.  Marland Kitchen thiamine (VITAMIN B-1) 50 MG tablet Take 50 mg by mouth daily.     Allergies:   Patient has no known allergies.   Social History   Socioeconomic History  . Marital status: Married    Spouse name: Not on file  . Number of children: Not on file  . Years of education: Not on file  . Highest education level: Not on file  Occupational History  . Not on file  Tobacco Use  . Smoking status: Never Smoker  . Smokeless tobacco: Never Used  Substance and Sexual Activity  . Alcohol use: No  . Drug use: No  . Sexual activity: Not on file  Other Topics Concern  . Not on file  Social History Narrative  . Not on file   Social Determinants of Health   Financial Resource Strain:   . Difficulty of Paying Living Expenses:   Food Insecurity:   . Worried About Charity fundraiser in the Last Year:   . Arboriculturist in the Last Year:   Transportation Needs:   . Film/video editor (Medical):   Marland Kitchen Lack of Transportation (Non-Medical):   Physical Activity:   . Days of Exercise per Week:   . Minutes of Exercise per Session:   Stress:   . Feeling of Stress :   Social Connections:   . Frequency of  Communication with Friends and Family:   . Frequency of Social Gatherings with Friends and Family:   . Attends Religious Services:   . Active Member of Clubs or Organizations:   . Attends Banker Meetings:   Marland Kitchen Marital Status:      Family History: The patient's family history includes Diabetes in her brother, mother, and sister.  ROS:   Please see the history of present illness.    No real complaints other than tendency towards anxiety.  Having left shoulder and left leg discomfort intermittently.  This is been a chronic and ongoing problem.  All other systems reviewed and are negative.  EKGs/Labs/Other Studies Reviewed:    The following studies were reviewed today: 2D Doppler echocardiogram February  2020: IMPRESSIONS    1. The left ventricle has normal systolic function of 55-60%. The cavity  size was normal. Left ventricular diastolic parameters were normal  Elevated left ventricular end-diastolic pressure.  2. The right ventricle has normal systolic function. The cavity was  normal. There is no increase in right ventricular wall thickness. Right  ventricular systolic pressure is moderately elevated with an estimated  pressure of 43.6 mmHg.  3. The mitral valve is degenerative. There is moderate thickening and  mild calcification.  4. The tricuspid valve is normal in structure.  5. The aortic valve is tricuspid There is mild thickening of the aortic  valve. Aortic valve regurgitation is mild by color flow Doppler.  6. The pulmonic valve was normal in structure.   EKG:  EKG sinus tachycardia 117 bpm with biatrial abnormality.  Compared to October 2019, the heart rate is faster but otherwise unchanged.  Recent Labs: No results found for requested labs within last 8760 hours.  Recent Lipid Panel No results found for: CHOL, TRIG, HDL, CHOLHDL, VLDL, LDLCALC, LDLDIRECT  Physical Exam:    VS:  BP (!) 164/84   Pulse (!) 117   Ht 4\' 3"  (1.295 m)   Wt 137 lb 6.4 oz (62.3 kg)   SpO2 99%   BMI 37.14 kg/m     Wt Readings from Last 3 Encounters:  08/01/19 137 lb 6.4 oz (62.3 kg)  07/10/18 139 lb 12.8 oz (63.4 kg)  02/27/18 138 lb (62.6 kg)     GEN: Moderate obesity. No acute distress HEENT: Normal NECK: No JVD. LYMPHATICS: No lymphadenopathy CARDIAC: Soft left lower edge of sternal border systolic murmur.  Not heard in axilla are in the back.  RRR without diastolic murmur, gallop, or edema. VASCULAR:  Normal Pulses. No bruits. RESPIRATORY:  Clear to auscultation without rales, wheezing or rhonchi  ABDOMEN: Soft, non-tender, non-distended, No pulsatile mass, MUSCULOSKELETAL: No deformity  SKIN: Warm and dry NEUROLOGIC:  Alert and oriented x 3 PSYCHIATRIC:  Normal  affect   ASSESSMENT:    1. Paroxysmal atrial fibrillation (HCC)   2. Mitral valve insufficiency, unspecified etiology   3. Essential hypertension   4. Chronic anticoagulation   5. Hyperlipidemia with target LDL less than 70   6. Educated about COVID-19 virus infection    PLAN:    In order of problems listed above:  1. No clinical recurrence 2. Minimal mitral valve regurgitation is heard on auscultation. 3. Excellent blood pressure control based upon home recordings. 4. No bleeding on apixaban.  Therapy should be continued as noted. 5. LDL is too high given her status with diabetes.  Recommend switching to rosuvastatin 20 mg/day rather than Pravachol 20 mg/day.  Target LDL less than 70. 6. COVID-19 vaccine  is been received.  Clinical follow-up in 1 year.  Long shared phone conversation with patient, husband, and myself at the wrap-up of the office visit.  I encouraged continuous surveillance of the blood pressure several times per month.  It was very helpful to have recorded measurements from their home device.   Medication Adjustments/Labs and Tests Ordered: Current medicines are reviewed at length with the patient today.  Concerns regarding medicines are outlined above.  Orders Placed This Encounter  Procedures  . EKG 12-Lead   No orders of the defined types were placed in this encounter.   Patient Instructions  Medication Instructions:  Your physician recommends that you continue on your current medications as directed. Please refer to the Current Medication list given to you today.  *If you need a refill on your cardiac medications before your next appointment, please call your pharmacy*   Lab Work: None If you have labs (blood work) drawn today and your tests are completely normal, you will receive your results only by: Marland Kitchen MyChart Message (if you have MyChart) OR . A paper copy in the mail If you have any lab test that is abnormal or we need to change your  treatment, we will call you to review the results.   Testing/Procedures: None   Follow-Up: At [facility], you and your health needs are our priority.  As part of our continuing mission to provide you with exceptional heart care, we have created designated Provider Care Teams.  These Care Teams include your primary Cardiologist (physician) and Advanced Practice Providers (APPs -  Physician Assistants and Nurse Practitioners) who all work together to provide you with the care you need, when you need it.  We recommend signing up for the patient portal called "MyChart".  Sign up information is provided on this After Visit Summary.  MyChart is used to connect with patients for Virtual Visits (Telemedicine).  Patients are able to view lab/test results, encounter notes, upcoming appointments, etc.  Non-urgent messages can be sent to your provider as well.   To learn more about what you can do with MyChart, go to [email or web address].    Your next appointment:   12 month(s)  The format for your next appointment:   In Person  Provider:   You may see Lesleigh Noe, MD or one of the following Advanced Practice Providers on your designated Care Team:    Norma Fredrickson, NP  Nada Boozer, NP  Georgie Chard, NP    Other Instructions      Signed, Lesleigh Noe, MD  08/01/2019 12:37 PM    [facility]

## 2019-08-01 NOTE — Patient Instructions (Signed)

## 2020-02-11 MED ORDER — LOSARTAN POTASSIUM 50 MG PO TABS: 50.0000 mg | ORAL_TABLET | Freq: Every day | ORAL | 1 refills | Status: DC

## 2020-08-16 ENCOUNTER — Other Ambulatory Visit: Payer: Self-pay | Admitting: Interventional Cardiology

## 2020-08-16 NOTE — Telephone Encounter (Signed)
Pt's age 75, wt 62.3 kg, SCr 0.88, CrCl 53.49, last ov w/ HS 08/01/19.

## 2020-09-01 ENCOUNTER — Other Ambulatory Visit: Payer: Self-pay | Admitting: Interventional Cardiology

## 2020-09-09 ENCOUNTER — Ambulatory Visit: Payer: Medicare Other | Admitting: Interventional Cardiology

## 2020-09-14 DIAGNOSIS — H7202 Central perforation of tympanic membrane, left ear: Secondary | ICD-10-CM | POA: Diagnosis not present

## 2020-09-14 DIAGNOSIS — H6983 Other specified disorders of Eustachian tube, bilateral: Secondary | ICD-10-CM | POA: Diagnosis not present

## 2020-09-14 DIAGNOSIS — H6122 Impacted cerumen, left ear: Secondary | ICD-10-CM | POA: Diagnosis not present

## 2020-09-20 ENCOUNTER — Ambulatory Visit: Payer: Medicare Other | Admitting: Interventional Cardiology

## 2020-10-26 ENCOUNTER — Encounter: Payer: Self-pay | Admitting: Physician Assistant

## 2020-10-26 NOTE — Progress Notes (Signed)
Cardiology Office Note    Date:  10/27/2020   ID:  Rita Myers, DOB 26-Jul-1945, MRN [id number]  PCP:  Darrow Bussing, MD  Cardiologist:  Lesleigh Noe, MD  Electrophysiologist:  None   Chief Complaint: f/u atrial fib  History of Present Illness:   Rita Myers is a 75 y.o. female with history of nausea-related syncope, PAF, pulmonary HTN by echo 2020, myxomatous mitral valve with mild regurgitation, mild AI, HTN, HLD (managed by primary care) who presents for routine follow-up.   She was seen in the hospital in 2019 for nausea, vomiting, loose stool and syncope. She was found to be in atrial fib with mild-moderate MR by echo. She spontaneously converted to NSR but with salvos of SVT (possible recurrent afib) on telemetry. F/u outpatient monitor 2019 showed NSR/ST, average HR 78bpm. Last 2D echo 06/2018 EF 55-60%, elevated LVEDP, moderately elevated PASP, moderate thickening of MV with mild MR, mild AI.   She presents for follow-up today with her husband overall feeling well. She denies any chest pain, dyspnea, palpitations, dizziness or syncope. Per Dr. Michaelle Copas notes she is historically very anxious when she comes to the doctor's office. She admits this is the case, and that her BP is always up in a medical setting. They have been checking it at home and it typically runs 120s-130s systolic. Per review of VS, she has had a baseline elevated HR since around 2019. She does snore. She takes a daily nap.  Labwork independently reviewed: KPN 06/2019 Tchol 210, HDL 68, LDL 114; 12/2019 A1C 6.1, Cr 0.880, K 4.1, ALT 22 2019 Hgb 14.7, K 4.5, Cr 0.65, calcium 10.4, Mg 2.3, TSH wnl   Past Medical History:  Diagnosis Date   Aortic insufficiency    Essential hypertension 07/11/2017   Hyperlipemia    Hypertension    Hypokalemia 07/11/2017   Mitral regurgitation    Nausea, vomiting, and diarrhea 07/11/2017   Paroxysmal A-fib (HCC) 07/11/2017   Pulmonary hypertension (HCC)    Syncope,  vasovagal 07/11/2017    History reviewed. No pertinent surgical history.  Current Medications: Current Meds  Medication Sig   calcium citrate-vitamin D (CITRACAL+D) 315-200 MG-UNIT tablet Take 1 tablet by mouth 2 (two) times daily.   diltiazem (DILACOR XR) 240 MG 24 hr capsule Take 1 capsule (240 mg total) by mouth daily.   ELIQUIS 5 MG TABS tablet TAKE 1 TABLET BY MOUTH TWICE A DAY   hydrochlorothiazide (MICROZIDE) 12.5 MG capsule TAKE 1 CAPSULE BY MOUTH EVERY DAY   loratadine (CLARITIN) 10 MG tablet Take 10 mg by mouth daily.   losartan (COZAAR) 50 MG tablet TAKE 1 TABLET BY MOUTH EVERY DAY   pravastatin (PRAVACHOL) 20 MG tablet Take 20 mg by mouth daily.   thiamine (VITAMIN B-1) 50 MG tablet Take 50 mg by mouth daily.      Allergies:   Patient has no known allergies.   Social History   Socioeconomic History   Marital status: Married    Spouse name: Not on file   Number of children: Not on file   Years of education: Not on file   Highest education level: Not on file  Occupational History   Not on file  Tobacco Use   Smoking status: Never   Smokeless tobacco: Never  Vaping Use   Vaping Use: Never used  Substance and Sexual Activity   Alcohol use: No   Drug use: No   Sexual activity: Not on file  Other Topics Concern  Not on file  Social History Narrative   Not on file   Social Determinants of Health   Financial Resource Strain: Not on file  Food Insecurity: Not on file  Transportation Needs: Not on file  Physical Activity: Not on file  Stress: Not on file  Social Connections: Not on file     Family History:  The patient's family history includes Diabetes in her brother, mother, and sister.  ROS:   Please see the history of present illness.  All other systems are reviewed and otherwise negative.    EKGs/Labs/Other Studies Reviewed:    Studies reviewed are outlined and summarized above. Reports included below if pertinent.  2D echo 06/2018  1. The  left ventricle has normal systolic function of 55-60%. The cavity  size was normal. Left ventricular diastolic parameters were normal  Elevated left ventricular end-diastolic pressure.   2. The right ventricle has normal systolic function. The cavity was  normal. There is no increase in right ventricular wall thickness. Right  ventricular systolic pressure is moderately elevated with an estimated  pressure of 43.6 mmHg.   3. The mitral valve is degenerative. There is moderate thickening and  mild calcification.   4. The tricuspid valve is normal in structure.   5. The aortic valve is tricuspid There is mild thickening of the aortic  valve. Aortic valve regurgitation is mild by color flow Doppler.   6. The pulmonic valve was normal in structure.   Event monitor 07/2017 Normal sinus rhythm and sinus tachycardia   NSR with occasional sinus tachycardia    EKG:  EKG is ordered today, personally reviewed, demonstrating borderline sinus tach 1030bpm, no acute STT changes  Recent Labs: No results found for requested labs within last 8760 hours.  Recent Lipid Panel No results found for: CHOL, TRIG, HDL, CHOLHDL, VLDL, LDLCALC, LDLDIRECT  PHYSICAL EXAM:    VS:  BP (!) 160/70 (BP Location: Left Arm, Patient Position: Sitting, Cuff Size: Normal)   Pulse (!) 103   Ht 4\' 3"  (1.295 m)   Wt 144 lb (65.3 kg)   BMI 38.92 kg/m   BMI: Body mass index is 38.92 kg/m.  GEN: Well nourished, well developed female in no acute distress HEENT: normocephalic, atraumatic Neck: no JVD, carotid bruits, or masses Cardiac: regular rhythm, mildly elevated rate, no murmurs, rubs, or gallops, no edema  Respiratory:  clear to auscultation bilaterally, normal work of breathing GI: soft, nontender, nondistended, + BS MS: no deformity or atrophy Skin: warm and dry, no rash Neuro:  Alert and Oriented x 3, Strength and sensation are intact, follows commands Psych: euthymic mood, full affect  Wt Readings from  Last 3 Encounters:  10/27/20 144 lb (65.3 kg)  08/01/19 137 lb 6.4 oz (62.3 kg)  07/10/18 139 lb 12.8 oz (63.4 kg)     ASSESSMENT & PLAN:   Paroxysmal atrial fibrillation, also sinus tachycardia - no recurrent PAF recently. She is noted to have mild sinus tachycardia on exam which appears persistent over the last several visits since 2019. Event monitor around that time showed average HR 78bpm. We will update her CBC, BMET and TSH today. Continue with Eliquis 5mg  BID for CHADSVASC of 3. Continue diltiazem 240mg  daily. I did instruct her to follow HR at home and contact 2020 if she notices it tending to run 100 or higher regularly at which time I would suggest increasing diltiazem to 300mg  daily. She does not wish to make any changes today since she has been  feeling overall well.  Essential HTN - + h/o white coat HTN reported due to anxiety. They report home BP numbers have been controlled. The patient was instructed to monitor their blood pressure at home and to call if tending to run higher than 135 systolic (conservative given h/o syncope). Will also assess for OSA as below.  Pulmonary HTN, sleep disordered breathing - moderate pulmonary HTN noted on prior echocardiograms in 2019 and 2020. She also has evidence of snoring and takes daily naps. It has been 2 years since this was assessed so will undertake updated echocardiogram to reassess pulmonary pressures. With pulmonary HTN, PAF and hypertension, would suggest proceeding with sleep study for evaluation. I am hopeful we can do a home test where she is most comfortable.  Mild mitral regurgitation/mild aortic insufficiency - degenerative MV noted in prior echoes. We are obtaining an echo above for pulmonary HTN and will reassess valve status by that study.  Disposition: F/u with Dr. Katrinka Blazing in 1 year, sooner if needed.   Medication Adjustments/Labs and Tests Ordered: Current medicines are reviewed at length with the patient today.  Concerns  regarding medicines are outlined above. Medication changes, Labs and Tests ordered today are summarized above and listed in the Patient Instructions accessible in Encounters.   Signed, Laurann Montana, PA-C  10/27/2020 12:12 PM    [facility] [address] [place], [place], [place]  [address] Phone: [phone]; Fax: [phone]

## 2020-10-27 ENCOUNTER — Other Ambulatory Visit: Payer: Self-pay

## 2020-10-27 ENCOUNTER — Ambulatory Visit: Payer: Medicare Other | Admitting: Physician Assistant

## 2020-10-27 ENCOUNTER — Encounter: Payer: Self-pay | Admitting: Physician Assistant

## 2020-10-27 VITALS — BP 160/70 | HR 103 | Ht <= 58 in | Wt 144.0 lb

## 2020-10-27 DIAGNOSIS — G4730 Sleep apnea, unspecified: Secondary | ICD-10-CM | POA: Diagnosis not present

## 2020-10-27 DIAGNOSIS — I351 Nonrheumatic aortic (valve) insufficiency: Secondary | ICD-10-CM | POA: Diagnosis not present

## 2020-10-27 DIAGNOSIS — I340 Nonrheumatic mitral (valve) insufficiency: Secondary | ICD-10-CM

## 2020-10-27 DIAGNOSIS — I480 Paroxysmal atrial fibrillation: Secondary | ICD-10-CM | POA: Diagnosis not present

## 2020-10-27 DIAGNOSIS — I10 Essential (primary) hypertension: Secondary | ICD-10-CM

## 2020-10-27 DIAGNOSIS — R000 Tachycardia, unspecified: Secondary | ICD-10-CM | POA: Diagnosis not present

## 2020-10-27 DIAGNOSIS — I272 Pulmonary hypertension, unspecified: Secondary | ICD-10-CM | POA: Diagnosis not present

## 2020-10-27 NOTE — Addendum Note (Signed)
Addended by: Laurann Montana on: 10/27/2020 01:08 PM   Modules accepted: Orders

## 2020-10-27 NOTE — Patient Instructions (Addendum)
Medication Instructions:  Your physician recommends that you continue on your current medications as directed. Please refer to the Current Medication list given to you today.  *If you need a refill on your cardiac medications before your next appointment, please call your pharmacy*   Lab Work: TODAY:  BMET, CBC, & TSH  If you have labs (blood work) drawn today and your tests are completely normal, you will receive your results only by: MyChart Message (if you have MyChart) OR A paper copy in the mail If you have any lab test that is abnormal or we need to change your treatment, we will call you to review the results.   Testing/Procedures: Your physician has requested that you have an echocardiogram. Echocardiography is a painless test that uses sound waves to create images of your heart. It provides your doctor with information about the size and shape of your heart and how well your heart's chambers and valves are working. This procedure takes approximately one hour. There are no restrictions for this procedure.  Your physician has recommended that you have a sleep study. This test records several body functions during sleep, including: brain activity, eye movement, oxygen and carbon dioxide blood levels, heart rate and rhythm, breathing rate and rhythm, the flow of air through your mouth and nose, snoring, body muscle movements, and chest and belly movement.  Follow-Up: At [facility], you and your health needs are our priority.  As part of our continuing mission to provide you with exceptional heart care, we have created designated Provider Care Teams.  These Care Teams include your primary Cardiologist (physician) and Advanced Practice Providers (APPs -  Physician Assistants and Nurse Practitioners) who all work together to provide you with the care you need, when you need it.  We recommend signing up for the patient portal called "MyChart".  Sign up information is provided on this  After Visit Summary.  MyChart is used to connect with patients for Virtual Visits (Telemedicine).  Patients are able to view lab/test results, encounter notes, upcoming appointments, etc.  Non-urgent messages can be sent to your provider as well.   To learn more about what you can do with MyChart, go to [email or web address].    Your next appointment:   12 month(s)  The format for your next appointment:   In Person  Provider:   You may see Lesleigh Noe, MD or one of the following Advanced Practice Providers on your designated Care Team:   Georgie Chard, NP   Other Instructions Please monitor your blood pressure occasionally at home. Call your doctor if you tend to get readings of greater than 135 on the top number or 80 on the bottom number. To check your blood pressure, choose a time at least 3 hours after taking your blood pressure medicines.  Please follow your heart rate at home. If you notice it running over 100 beats per minute consistently, please contact our office.

## 2020-10-27 NOTE — Progress Notes (Signed)
STOP BANG RISK ASSESSMENT S (snore) Have you been told that you snore?     YES   T (tired) Are you often tired, fatigued, or sleepy during the day?   NO  O (obstruction) Do you stop breathing, choke, or gasp during sleep? NO   P (pressure) Do you have or are you being treated for high blood pressure? YES   B (BMI) Is your body index greater than 35 kg/m? YES   A (age) Are you 78 years old or older? YES   N (neck) Do you have a neck circumference greater than 16 inches?   NO   G (gender) Are you a female? NO   TOTAL STOP/BANG "YES" ANSWERS 4                                                                       For Office Use Only              Procedure Order Form    YES to 3+ Stop Bang questions OR two clinical symptoms - patient qualifies for WatchPAT (CPT 95800)             Clinical Notes: Will consult Sleep Specialist and refer for management of therapy due to patient increased risk of Sleep Apnea. Ordering a sleep study due to the following two clinical symptoms: Excessive daytime sleepiness G47.10 / Gastroesophageal reflux K21.9 / Nocturia R35.1 / Morning Headaches G44.221 / Difficulty concentrating R41.840 / Memory problems or poor judgment G31.84 / Personality changes or irritability R45.4 / Loud snoring R06.83 / Depression F32.9 / Unrefreshed by sleep G47.8 / Impotence N52.9 / History of high blood pressure R03.0 / Insomnia G47.00

## 2020-10-28 ENCOUNTER — Telehealth: Payer: Self-pay | Admitting: *Deleted

## 2020-10-28 LAB — CBC
Hematocrit: 42.2 % (ref 34.0–46.6)
Hemoglobin: 14.3 g/dL (ref 11.1–15.9)
MCH: 30.4 pg (ref 26.6–33.0)
MCHC: 33.9 g/dL (ref 31.5–35.7)
MCV: 90 fL (ref 79–97)
Platelets: 174 10*3/uL (ref 150–450)
RBC: 4.71 x10E6/uL (ref 3.77–5.28)
RDW: 12.3 % (ref 11.7–15.4)
WBC: 7.3 10*3/uL (ref 3.4–10.8)

## 2020-10-28 LAB — BASIC METABOLIC PANEL
BUN/Creatinine Ratio: 17 (ref 12–28)
BUN: 12 mg/dL (ref 8–27)
CO2: 23 mmol/L (ref 20–29)
Calcium: 9.9 mg/dL (ref 8.7–10.3)
Chloride: 102 mmol/L (ref 96–106)
Creatinine, Ser: 0.72 mg/dL (ref 0.57–1.00)
Glucose: 84 mg/dL (ref 65–99)
Potassium: 4.8 mmol/L (ref 3.5–5.2)
Sodium: 141 mmol/L (ref 134–144)
eGFR: 87 mL/min/{1.73_m2} (ref >59)

## 2020-10-28 LAB — TSH: TSH: 6.51 u[IU]/mL — ABNORMAL HIGH (ref 0.450–4.500)

## 2020-10-28 NOTE — Telephone Encounter (Signed)
-----   Message from Gaynelle Cage, CMA sent at 10/28/2020  8:42 AM EDT ----- Regarding: FW: [name] Ok to activate. No PA is required. ----- Message ----- From: Elliot Cousin, RMA Sent: 10/27/2020   2:11 PM EDT To: [facility] Sleep Studies Subject: [name]                                         We ordered a [name] sleep study on this pt.

## 2020-10-28 NOTE — Telephone Encounter (Signed)
Noted  

## 2020-10-28 NOTE — Telephone Encounter (Signed)
I called to s/w the pt and let her know she could ahead and do sleep study tonight and give he the PIN #. Pt's husband answered. I asked if she was available, though he do not put the pt on the phone. I could hear the pt in the back ground. Pt's husband states pt has decided she is not going to do the [facility] Sleep Study and that they will be returning the [name] sleep study sometime early next week, Monday or Tuesday. Pt's husband said he will send an email to Ronie Spies, [facility] explaining why pt is not doing sleep study. I assured the pt's husband that I will make a note today and forward my note to [facility] as FYI.

## 2020-11-09 NOTE — Telephone Encounter (Signed)
Rita Myers, CMA said pt came in and dropped off the [name] sleep study (unopened). I will document that the sleep study has been returned fully intact. I will add this box back into the inventory.

## 2020-11-11 DIAGNOSIS — E038 Other specified hypothyroidism: Secondary | ICD-10-CM | POA: Diagnosis not present

## 2020-11-22 ENCOUNTER — Ambulatory Visit (HOSPITAL_COMMUNITY): Payer: Medicare Other | Attending: Cardiovascular Disease

## 2020-11-22 DIAGNOSIS — I351 Nonrheumatic aortic (valve) insufficiency: Secondary | ICD-10-CM | POA: Insufficient documentation

## 2020-11-22 DIAGNOSIS — I10 Essential (primary) hypertension: Secondary | ICD-10-CM

## 2020-11-22 DIAGNOSIS — R000 Tachycardia, unspecified: Secondary | ICD-10-CM | POA: Diagnosis not present

## 2020-11-22 DIAGNOSIS — G4730 Sleep apnea, unspecified: Secondary | ICD-10-CM | POA: Insufficient documentation

## 2020-11-22 DIAGNOSIS — I272 Pulmonary hypertension, unspecified: Secondary | ICD-10-CM | POA: Diagnosis not present

## 2020-11-22 DIAGNOSIS — I480 Paroxysmal atrial fibrillation: Secondary | ICD-10-CM | POA: Diagnosis not present

## 2020-11-22 DIAGNOSIS — I340 Nonrheumatic mitral (valve) insufficiency: Secondary | ICD-10-CM | POA: Diagnosis not present

## 2020-11-22 LAB — ECHOCARDIOGRAM COMPLETE
Area-P 1/2: 6.48 cm2
MV M vel: 5.48 m/s
MV Peak grad: 120 mmHg
P 1/2 time: 433 msec
S' Lateral: 3.1 cm

## 2020-11-26 ENCOUNTER — Other Ambulatory Visit: Payer: Self-pay | Admitting: Interventional Cardiology

## 2020-11-29 NOTE — Telephone Encounter (Signed)
Pt's age 75, wt 65.3 kg, SCr 0.72, CrCl 68.52, last ov w/ DD 10/27/20.

## 2021-01-19 DIAGNOSIS — E1169 Type 2 diabetes mellitus with other specified complication: Secondary | ICD-10-CM | POA: Diagnosis not present

## 2021-01-19 DIAGNOSIS — Z0001 Encounter for general adult medical examination with abnormal findings: Secondary | ICD-10-CM | POA: Diagnosis not present

## 2021-01-19 DIAGNOSIS — E7800 Pure hypercholesterolemia, unspecified: Secondary | ICD-10-CM | POA: Diagnosis not present

## 2021-01-19 DIAGNOSIS — E038 Other specified hypothyroidism: Secondary | ICD-10-CM | POA: Diagnosis not present

## 2021-01-19 DIAGNOSIS — I4891 Unspecified atrial fibrillation: Secondary | ICD-10-CM | POA: Diagnosis not present

## 2021-01-19 DIAGNOSIS — Z79899 Other long term (current) drug therapy: Secondary | ICD-10-CM | POA: Diagnosis not present

## 2021-01-19 DIAGNOSIS — I10 Essential (primary) hypertension: Secondary | ICD-10-CM | POA: Diagnosis not present

## 2021-01-24 ENCOUNTER — Other Ambulatory Visit: Payer: Self-pay | Admitting: Family Medicine

## 2021-01-24 DIAGNOSIS — E2839 Other primary ovarian failure: Secondary | ICD-10-CM

## 2021-02-02 DIAGNOSIS — Z23 Encounter for immunization: Secondary | ICD-10-CM | POA: Diagnosis not present

## 2021-02-02 DIAGNOSIS — Z7689 Persons encountering health services in other specified circumstances: Secondary | ICD-10-CM | POA: Diagnosis not present

## 2021-03-15 DIAGNOSIS — H9012 Conductive hearing loss, unilateral, left ear, with unrestricted hearing on the contralateral side: Secondary | ICD-10-CM | POA: Diagnosis not present

## 2021-03-15 DIAGNOSIS — H7202 Central perforation of tympanic membrane, left ear: Secondary | ICD-10-CM | POA: Diagnosis not present

## 2021-03-15 DIAGNOSIS — H6122 Impacted cerumen, left ear: Secondary | ICD-10-CM | POA: Diagnosis not present

## 2021-06-03 DIAGNOSIS — J40 Bronchitis, not specified as acute or chronic: Secondary | ICD-10-CM | POA: Diagnosis not present

## 2021-06-03 DIAGNOSIS — R059 Cough, unspecified: Secondary | ICD-10-CM | POA: Diagnosis not present

## 2021-06-03 DIAGNOSIS — J019 Acute sinusitis, unspecified: Secondary | ICD-10-CM | POA: Diagnosis not present

## 2021-08-13 ENCOUNTER — Other Ambulatory Visit: Payer: Self-pay | Admitting: Interventional Cardiology

## 2021-10-13 ENCOUNTER — Other Ambulatory Visit: Payer: Self-pay | Admitting: Interventional Cardiology

## 2021-10-14 DIAGNOSIS — H7202 Central perforation of tympanic membrane, left ear: Secondary | ICD-10-CM | POA: Diagnosis not present

## 2021-10-14 DIAGNOSIS — H6982 Other specified disorders of Eustachian tube, left ear: Secondary | ICD-10-CM | POA: Diagnosis not present

## 2021-10-14 DIAGNOSIS — H6122 Impacted cerumen, left ear: Secondary | ICD-10-CM | POA: Diagnosis not present

## 2021-11-09 ENCOUNTER — Other Ambulatory Visit: Payer: Self-pay | Admitting: Interventional Cardiology

## 2021-11-09 DIAGNOSIS — I480 Paroxysmal atrial fibrillation: Secondary | ICD-10-CM

## 2021-11-09 NOTE — Telephone Encounter (Addendum)
Eliquis 5mg  refill request received. Patient is 76 years old, weight-65.3kg, Crea-0.72 on 10/27/2020-needs labs, Diagnosis-Afib, and last seen by 10/29/2020 on 10/27/2020-needs appt. Dose is appropriate based on dosing criteria.   Pt needs labs and an appt.  Message sent to schedulers for an appt.   Message received from scheduler:  10/29/2020, RN Schedule her for 12/14/21 and lvmom regarding appointment    At 312pm-Called pt's dtr since unable to reach pt and dtr states the husband to call back regarding appt that was set and the eliquis refill.   Pt's husband called back and confirmed appt and refill was sent. Also ordered labs.

## 2021-12-02 ENCOUNTER — Other Ambulatory Visit: Payer: Self-pay | Admitting: Interventional Cardiology

## 2021-12-13 NOTE — Progress Notes (Signed)
Cardiology Office Note:    Date:  12/14/2021   ID:  Rita Myers, DOB May 18, 1945, MRN [id number]  PCP:  Lujean Amel, MD  Cardiologist:  Sinclair Grooms, MD   Referring MD: Lujean Amel, MD   Chief Complaint  Patient presents with   Atrial Fibrillation   Cardiac Valve Problem    Mitral regurgitation   Hyperlipidemia   Hypertension    History of Present Illness:    Rita Myers is a 76 y.o. female with a hx of  nausea related syncope, paroxysmal atrial fibrillation, hypertension, myxomatous mitral valve with regurgitation, and hyperlipidemia.  [facility] is 2.   The patient is doing well.  She is accompanied by her husband.  She denies any cardiac complaints.  They walk greater than 30 minutes 5 days/week [place].  She denies any cardiac symptoms.  She is somewhat limited by left knee pain.  She has had no significant or prolonged palpitations.  No readmission for atrial fibrillation.  Denies orthopnea, PND, peripheral edema, and transient neurological symptoms.  No bleeding on Eliquis.  Past Medical History:  Diagnosis Date   Aortic insufficiency    Essential hypertension 07/11/2017   Hyperlipemia    Hypertension    Hypokalemia 07/11/2017   Mitral regurgitation    Nausea, vomiting, and diarrhea 07/11/2017   Paroxysmal A-fib ([place]) 07/11/2017   Pulmonary hypertension ([place])    Syncope, vasovagal 07/11/2017    History reviewed. No pertinent surgical history.  Current Medications: Current Meds  Medication Sig   apixaban (ELIQUIS) 5 MG TABS tablet TAKE 1 TABLET BY MOUTH TWICE A DAY   calcium citrate-vitamin D (CITRACAL+D) 315-200 MG-UNIT tablet Take 1 tablet by mouth 2 (two) times daily.   ciprofloxacin-dexamethasone (CIPRODEX) OTIC suspension Place 4 drops into the left ear once a week.   diltiazem (DILACOR XR) 240 MG 24 hr capsule Take 1 capsule (240 mg total) by mouth daily.   hydrochlorothiazide (MICROZIDE) 12.5 MG capsule TAKE 1  CAPSULE BY MOUTH EVERY DAY   loratadine (CLARITIN) 10 MG tablet Take 10 mg by mouth daily.   losartan (COZAAR) 50 MG tablet TAKE 1 TABLET BY MOUTH EVERY DAY   pravastatin (PRAVACHOL) 20 MG tablet Take 20 mg by mouth daily.   thiamine (VITAMIN B-1) 50 MG tablet Take 50 mg by mouth daily.     Allergies:   Patient has no known allergies.   Social History   Socioeconomic History   Marital status: Married    Spouse name: Not on file   Number of children: Not on file   Years of education: Not on file   Highest education level: Not on file  Occupational History   Not on file  Tobacco Use   Smoking status: Never   Smokeless tobacco: Never  Vaping Use   Vaping Use: Never used  Substance and Sexual Activity   Alcohol use: No   Drug use: No   Sexual activity: Not on file  Other Topics Concern   Not on file  Social History Narrative   Not on file   Social Determinants of Health   Financial Resource Strain: Not on file  Food Insecurity: Not on file  Transportation Needs: Not on file  Physical Activity: Not on file  Stress: Not on file  Social Connections: Not on file     Family History: The patient's family history includes Diabetes in her brother, mother, and sister.  ROS:   Please see the history of  present illness.    No blood in the urine or stool.  Left knee arthritis.  All other systems reviewed and are negative.  EKGs/Labs/Other Studies Reviewed:    The following studies were reviewed today:  ECHOCARDIOGRAPHY 11/2020: IMPRESSIONS   1. Left ventricular ejection fraction, by estimation, is 50 to 55%. The  left ventricle has low normal function. The left ventricle has no regional  wall motion abnormalities. The left ventricular internal cavity size was  mildly dilated. Left ventricular  diastolic parameters are consistent with Grade II diastolic dysfunction  (pseudonormalization). Elevated left ventricular end-diastolic pressure.   2. Right ventricular systolic  function is normal. The right ventricular  size is normal.   3. The pericardial effusion is posterior to the left ventricle.   4. The mitral valve is abnormal. Mild mitral valve regurgitation. No  evidence of mitral stenosis. Moderate mitral annular calcification.   5. The aortic valve is tricuspid. There is mild calcification of the  aortic valve. Aortic valve regurgitation is mild. Mild aortic valve  sclerosis is present, with no evidence of aortic valve stenosis.   6. The inferior vena cava is normal in size with greater than 50%  respiratory variability, suggesting right atrial pressure of 3 mmHg.   EKG:  EKG sinus rhythm, 90 bpm, left atrial abnormality, PAC.  Compared to prior tracing from October 27, 2020  Recent Labs: No results found for requested labs within last 365 days.  Recent Lipid Panel No results found for: "CHOL", "TRIG", "HDL", "CHOLHDL", "VLDL", "LDLCALC", "LDLDIRECT"  Physical Exam:    VS:  BP (!) 142/64   Pulse 90   Ht '4\' 3"'  (1.295 m)   Wt 139 lb 6.4 oz (63.2 kg)   SpO2 98%   BMI 37.68 kg/m     Wt Readings from Last 3 Encounters:  12/14/21 139 lb 6.4 oz (63.2 kg)  10/27/20 144 lb (65.3 kg)  08/01/19 137 lb 6.4 oz (62.3 kg)     GEN: Slightly overweight. No acute distress HEENT: Normal NECK: No JVD. LYMPHATICS: No lymphadenopathy CARDIAC: Faint left lower sternal systolic murmur. RRR no gallop, or edema. VASCULAR:  Normal Pulses. No bruits. RESPIRATORY:  Clear to auscultation without rales, wheezing or rhonchi  ABDOMEN: Soft, non-tender, non-distended, No pulsatile mass, MUSCULOSKELETAL: No deformity  SKIN: Warm and dry NEUROLOGIC:  Alert and oriented x 3 PSYCHIATRIC:  Normal affect   ASSESSMENT:    1. PAF (paroxysmal atrial fibrillation) ([place])   2. Pulmonary hypertension, unspecified ([place])   3. Sleep-disordered breathing   4. Mitral valve insufficiency, unspecified etiology   5. Chronic anticoagulation    PLAN:    In order of problems listed  above:  No clinical recurrence. No clinical evidence of right heart failure or pulmonary hypertension Did not discuss Clinically present and not felt to be a significant problem currently. Continue Eliquis.  Hemoglobin and be met today.  Follow-up in 9 to 12 months.  Needs hemoglobin, creatinine performed twice yearly.   Medication Adjustments/Labs and Tests Ordered: Current medicines are reviewed at length with the patient today.  Concerns regarding medicines are outlined above.  Orders Placed This Encounter  Procedures   EKG 12-Lead   No orders of the defined types were placed in this encounter.   Patient Instructions  Medication Instructions:  Your physician recommends that you continue on your current medications as directed. Please refer to the Current Medication list given to you today.  *If you need a refill on your cardiac medications before your  next appointment, please call your pharmacy*  Lab Work: NONE  Testing/Procedures: NONE  Follow-Up: At [facility], you and your health needs are our priority.  As part of our continuing mission to provide you with exceptional heart care, we have created designated Provider Care Teams.  These Care Teams include your primary Cardiologist (physician) and Advanced Practice Providers (APPs -  Physician Assistants and Nurse Practitioners) who all work together to provide you with the care you need, when you need it.  Your next appointment:   1 year(s)  The format for your next appointment:   In Person  Provider:   Sinclair Grooms, MD {   Important Information About Sugar         Signed, Sinclair Grooms, MD  12/14/2021 11:13 AM    [place]

## 2021-12-14 ENCOUNTER — Other Ambulatory Visit: Payer: Medicare Other

## 2021-12-14 ENCOUNTER — Encounter: Payer: Self-pay | Admitting: Interventional Cardiology

## 2021-12-14 ENCOUNTER — Ambulatory Visit: Payer: Medicare Other | Admitting: Interventional Cardiology

## 2021-12-14 VITALS — BP 142/64 | HR 90 | Ht <= 58 in | Wt 139.4 lb

## 2021-12-14 DIAGNOSIS — I480 Paroxysmal atrial fibrillation: Secondary | ICD-10-CM

## 2021-12-14 DIAGNOSIS — I351 Nonrheumatic aortic (valve) insufficiency: Secondary | ICD-10-CM

## 2021-12-14 DIAGNOSIS — Z7901 Long term (current) use of anticoagulants: Secondary | ICD-10-CM | POA: Diagnosis not present

## 2021-12-14 DIAGNOSIS — G4730 Sleep apnea, unspecified: Secondary | ICD-10-CM | POA: Diagnosis not present

## 2021-12-14 DIAGNOSIS — I340 Nonrheumatic mitral (valve) insufficiency: Secondary | ICD-10-CM | POA: Diagnosis not present

## 2021-12-14 DIAGNOSIS — I272 Pulmonary hypertension, unspecified: Secondary | ICD-10-CM

## 2021-12-14 LAB — BASIC METABOLIC PANEL
BUN/Creatinine Ratio: 17 (ref 12–28)
BUN: 14 mg/dL (ref 8–27)
CO2: 24 mmol/L (ref 20–29)
Calcium: 9.9 mg/dL (ref 8.7–10.3)
Chloride: 103 mmol/L (ref 96–106)
Creatinine, Ser: 0.83 mg/dL (ref 0.57–1.00)
Glucose: 106 mg/dL — ABNORMAL HIGH (ref 70–99)
Potassium: 4.2 mmol/L (ref 3.5–5.2)
Sodium: 142 mmol/L (ref 134–144)
eGFR: 73 mL/min/{1.73_m2} (ref >59)

## 2021-12-14 LAB — CBC
Hematocrit: 40.3 % (ref 34.0–46.6)
Hemoglobin: 13.8 g/dL (ref 11.1–15.9)
MCH: 30.7 pg (ref 26.6–33.0)
MCHC: 34.2 g/dL (ref 31.5–35.7)
MCV: 90 fL (ref 79–97)
Platelets: 179 10*3/uL (ref 150–450)
RBC: 4.5 x10E6/uL (ref 3.77–5.28)
RDW: 12.4 % (ref 11.7–15.4)
WBC: 7.4 10*3/uL (ref 3.4–10.8)

## 2021-12-14 NOTE — Patient Instructions (Signed)

## 2021-12-15 ENCOUNTER — Telehealth: Payer: Self-pay | Admitting: Interventional Cardiology

## 2021-12-15 NOTE — Telephone Encounter (Signed)
Calling in regards to labs. Please advise

## 2021-12-15 NOTE — Telephone Encounter (Signed)
Pt spouse calling back for lab results.

## 2021-12-15 NOTE — Telephone Encounter (Signed)
Returned call to patient's spouse Arbutus Ped (OK per [facility]). Informed Mohamed that Dr. Katrinka Blazing has not yet had a chance to review patient's lab work, but values appear to be within normal range other than elevated glucose level. Will wait for Dr. Katrinka Blazing to review and discuss further at that time.  Mohamed verbalized understanding and expressed appreciation for call.

## 2021-12-15 NOTE — Telephone Encounter (Signed)
Left message to call back a triage RN.

## 2021-12-24 DIAGNOSIS — U071 COVID-19: Secondary | ICD-10-CM | POA: Diagnosis not present

## 2021-12-24 DIAGNOSIS — R059 Cough, unspecified: Secondary | ICD-10-CM | POA: Diagnosis not present

## 2021-12-24 DIAGNOSIS — R0981 Nasal congestion: Secondary | ICD-10-CM | POA: Diagnosis not present

## 2021-12-27 ENCOUNTER — Other Ambulatory Visit: Payer: Self-pay

## 2021-12-27 ENCOUNTER — Other Ambulatory Visit: Payer: Self-pay | Admitting: Interventional Cardiology

## 2021-12-27 DIAGNOSIS — E038 Other specified hypothyroidism: Secondary | ICD-10-CM | POA: Insufficient documentation

## 2021-12-27 DIAGNOSIS — E119 Type 2 diabetes mellitus without complications: Secondary | ICD-10-CM | POA: Insufficient documentation

## 2021-12-27 DIAGNOSIS — E039 Hypothyroidism, unspecified: Secondary | ICD-10-CM | POA: Insufficient documentation

## 2021-12-27 DIAGNOSIS — E1169 Type 2 diabetes mellitus with other specified complication: Secondary | ICD-10-CM | POA: Insufficient documentation

## 2021-12-27 DIAGNOSIS — E7800 Pure hypercholesterolemia, unspecified: Secondary | ICD-10-CM | POA: Insufficient documentation

## 2021-12-27 DIAGNOSIS — R7303 Prediabetes: Secondary | ICD-10-CM | POA: Insufficient documentation

## 2022-01-24 DIAGNOSIS — Z23 Encounter for immunization: Secondary | ICD-10-CM | POA: Diagnosis not present

## 2022-01-24 DIAGNOSIS — E038 Other specified hypothyroidism: Secondary | ICD-10-CM | POA: Diagnosis not present

## 2022-01-24 DIAGNOSIS — Z0001 Encounter for general adult medical examination with abnormal findings: Secondary | ICD-10-CM | POA: Diagnosis not present

## 2022-01-24 DIAGNOSIS — E1169 Type 2 diabetes mellitus with other specified complication: Secondary | ICD-10-CM | POA: Diagnosis not present

## 2022-01-24 DIAGNOSIS — Z79899 Other long term (current) drug therapy: Secondary | ICD-10-CM | POA: Diagnosis not present

## 2022-01-24 DIAGNOSIS — E7800 Pure hypercholesterolemia, unspecified: Secondary | ICD-10-CM | POA: Diagnosis not present

## 2022-01-24 DIAGNOSIS — I10 Essential (primary) hypertension: Secondary | ICD-10-CM | POA: Diagnosis not present

## 2022-01-24 DIAGNOSIS — I480 Paroxysmal atrial fibrillation: Secondary | ICD-10-CM | POA: Diagnosis not present

## 2022-01-27 DIAGNOSIS — Z79899 Other long term (current) drug therapy: Secondary | ICD-10-CM | POA: Diagnosis not present

## 2022-01-27 DIAGNOSIS — E7800 Pure hypercholesterolemia, unspecified: Secondary | ICD-10-CM | POA: Diagnosis not present

## 2022-01-27 DIAGNOSIS — E038 Other specified hypothyroidism: Secondary | ICD-10-CM | POA: Diagnosis not present

## 2022-01-27 DIAGNOSIS — E1169 Type 2 diabetes mellitus with other specified complication: Secondary | ICD-10-CM | POA: Diagnosis not present

## 2022-03-06 ENCOUNTER — Other Ambulatory Visit: Payer: Self-pay | Admitting: Interventional Cardiology

## 2022-03-06 DIAGNOSIS — I480 Paroxysmal atrial fibrillation: Secondary | ICD-10-CM

## 2022-03-06 NOTE — Telephone Encounter (Signed)
Prescription refill request for Eliquis received. Indication:Afib Last office visit:8/23 Scr:0.8 Age: 76 Weight:63.2 kg  Prescription refilled

## 2022-03-31 DIAGNOSIS — R0600 Dyspnea, unspecified: Secondary | ICD-10-CM | POA: Diagnosis not present

## 2022-03-31 DIAGNOSIS — M25552 Pain in left hip: Secondary | ICD-10-CM | POA: Diagnosis not present

## 2022-03-31 DIAGNOSIS — K449 Diaphragmatic hernia without obstruction or gangrene: Secondary | ICD-10-CM | POA: Diagnosis not present

## 2022-03-31 DIAGNOSIS — M1612 Unilateral primary osteoarthritis, left hip: Secondary | ICD-10-CM | POA: Diagnosis not present

## 2022-03-31 DIAGNOSIS — R600 Localized edema: Secondary | ICD-10-CM | POA: Diagnosis not present

## 2022-03-31 DIAGNOSIS — M7989 Other specified soft tissue disorders: Secondary | ICD-10-CM | POA: Diagnosis not present

## 2022-03-31 DIAGNOSIS — I82812 Embolism and thrombosis of superficial veins of left lower extremities: Secondary | ICD-10-CM | POA: Diagnosis not present

## 2022-03-31 DIAGNOSIS — I517 Cardiomegaly: Secondary | ICD-10-CM | POA: Diagnosis not present

## 2022-03-31 DIAGNOSIS — I499 Cardiac arrhythmia, unspecified: Secondary | ICD-10-CM | POA: Diagnosis not present

## 2022-03-31 DIAGNOSIS — M7122 Synovial cyst of popliteal space [Baker], left knee: Secondary | ICD-10-CM | POA: Diagnosis not present

## 2022-03-31 DIAGNOSIS — I824Z2 Acute embolism and thrombosis of unspecified deep veins of left distal lower extremity: Secondary | ICD-10-CM | POA: Diagnosis not present

## 2022-04-01 DIAGNOSIS — R0600 Dyspnea, unspecified: Secondary | ICD-10-CM | POA: Diagnosis not present

## 2022-04-01 DIAGNOSIS — K449 Diaphragmatic hernia without obstruction or gangrene: Secondary | ICD-10-CM | POA: Diagnosis not present

## 2022-04-01 DIAGNOSIS — I517 Cardiomegaly: Secondary | ICD-10-CM | POA: Diagnosis not present

## 2022-04-01 DIAGNOSIS — M25552 Pain in left hip: Secondary | ICD-10-CM | POA: Diagnosis not present

## 2022-04-12 ENCOUNTER — Ambulatory Visit
Admission: RE | Admit: 2022-04-12 | Discharge: 2022-04-12 | Disposition: A | Discharge status: Home / Self Care | Payer: Medicare Other | Source: Ambulatory Visit | Attending: Family Medicine | Admitting: Family Medicine

## 2022-04-12 ENCOUNTER — Other Ambulatory Visit: Payer: Self-pay | Admitting: Family Medicine

## 2022-04-12 DIAGNOSIS — E039 Hypothyroidism, unspecified: Secondary | ICD-10-CM | POA: Diagnosis not present

## 2022-04-12 DIAGNOSIS — R600 Localized edema: Secondary | ICD-10-CM | POA: Diagnosis not present

## 2022-04-12 DIAGNOSIS — I4891 Unspecified atrial fibrillation: Secondary | ICD-10-CM | POA: Diagnosis not present

## 2022-04-12 DIAGNOSIS — M25552 Pain in left hip: Secondary | ICD-10-CM

## 2022-04-17 NOTE — Progress Notes (Unsigned)
Office Visit    Patient Name: Rita Myers Date of Encounter: 04/18/2022  Primary Care Provider:  Darrow Bussing, MD Primary Cardiologist:  Lesleigh Noe, MD Primary Electrophysiologist: None  Chief Complaint    Rita Myers is a 76 y.o. female with PMH of HTN, pulmonary HTN, HLD syncope, PAF, pulmonary HTN by echo 2020, myxomatous mitral valve with mild regurgitation, mild AI, who presents today for complaint of lower extremity edema and pain.  Past Medical History    Past Medical History:  Diagnosis Date   Aortic insufficiency    Essential hypertension 07/11/2017   Hyperlipemia    Hypertension    Hypokalemia 07/11/2017   Mitral regurgitation    Nausea, vomiting, and diarrhea 07/11/2017   Paroxysmal A-fib ([facility]) 07/11/2017   Pulmonary hypertension ([facility])    Syncope, vasovagal 07/11/2017   No past surgical history on file.  Allergies  No Known Allergies  History of Present Illness    [name]  is a 76 year old female with the above mention past medical history who presents today for complaint of lower extremity edema and pain.  [name] was initially seen by Dr. Katrinka Blazing during consultation for new onset atrial fibrillation in 2019.  She presented with syncope and EKG revealed AF with RVR.  She was treated with IV Cardizem and started on Eliquis 5 mg twice daily.  2D echo was completed revealing EF of 50-55%, no RWMA, grade 1 DD with moderate MV and pulmonary HTN with pressure 58 mmHg.  F/u outpatient monitor 2019 showed NSR/ST, average HR 78 bpm.  She was seen in follow-up 10/27/2020 and reported feeling well overall.  She was instructed to increase Cardizem to 300 mg if heart rate is above 100.  She was sent for sleep study due to pulmonary hypertension seen on 2D echo and evidence of snoring.  Patient however declined and returned It[employer] device back to company.  She was last seen by Dr. Katrinka Blazing on 12/2021 and was doing well with no recurrence of PAF or evidence of right-sided  heart failure.  [name] presents today for complaint of lower extremity swelling with her husband.  Since last being seen in the office patient reports that she has experienced increased lower extremity swelling and pain on her left leg and right lower leg.  She recently went to visit her daughter in [place] and during her visit had noticed swelling and pain in her left lower leg.  She presented to the urgent care for evaluation and they referred her to a local hospital for further testing.  During her visit CT scan was completed to rule out possible blood clot that was negative and there were no acute fractures in her left hip or femur.  An ultrasound was completed and showed a clot in the left lesser saphenous vein.  She followed up with her PCP when she returns and he started her on Lasix 20 mg for 5 days.  She presents today with continued lower extremity swelling and pain in her left leg. During visit her blood pressure was slightly elevated at 144/70 and heart rate was 86 bpm. She is tolerating her current medications without any adverse reactions..  Patient denies chest pain, palpitations, dyspnea, PND, orthopnea, nausea, vomiting, dizziness, syncope, edema, weight gain, or early satiety.  Home Medications    Current Outpatient Medications  Medication Sig Dispense Refill   apixaban (ELIQUIS) 5 MG TABS tablet TAKE 1 TABLET BY MOUTH TWICE A DAY 60 tablet 5  B Complex-C (B-COMPLEX WITH VITAMIN C) tablet Take 1 tablet by mouth daily.     Calcium-Vitamin D (CALTRATE 600 PLUS-VIT D PO) Take 1 tablet by mouth in the morning and at bedtime.     ciprofloxacin-dexamethasone (CIPRODEX) OTIC suspension Place 4 drops into the left ear once a week.     levothyroxine (SYNTHROID) 25 MCG tablet Take 25 mcg by mouth every morning.     loratadine (CLARITIN) 10 MG tablet Take 10 mg by mouth daily.     losartan (COZAAR) 50 MG tablet TAKE 1 TABLET BY MOUTH EVERY DAY 90 tablet 3   pravastatin (PRAVACHOL) 20  MG tablet Take 20 mg by mouth daily.     TIADYLT ER 240 MG 24 hr capsule Take 240 mg by mouth daily.     No current facility-administered medications for this visit.     Review of Systems  Please see the history of present illness.    (+) Lower extremity swelling (+) Left hip pain  All other systems reviewed and are otherwise negative except as noted above.  Physical Exam    Wt Readings from Last 3 Encounters:  04/18/22 141 lb (64 kg)  12/14/21 139 lb 6.4 oz (63.2 kg)  10/27/20 144 lb (65.3 kg)   VS: Vitals:   04/18/22 0826  BP: (!) 144/70  Pulse: 86  SpO2: 97%  ,Body mass index is 36.66 kg/m.  Constitutional:      Appearance: Healthy appearance. Not in distress.  Neck:     Vascular: JVD normal.  Pulmonary:     Effort: Pulmonary effort is normal.     Breath sounds: No wheezing. No rales. Diminished in the bases Cardiovascular:     Normal rate. Regular rhythm. Normal S1. Normal S2.      Murmurs: There is no murmur.  Edema:    Bilateral +1 lower extremity edema.  Abdominal:     Palpations: Abdomen is soft non tender. There is no hepatomegaly.  Skin:    General: Skin is warm and dry.  Neurological:     General: No focal deficit present.     Mental Status: Alert and oriented to person, place and time.     Cranial Nerves: Cranial nerves are intact.  EKG/LABS/Other Studies Reviewed    ECG personally reviewed by me today -none completed today  Risk Assessment/Calculations:    CHA2DS2-VASc Score = 4   This indicates a 4.8% annual risk of stroke. The patient's score is based upon: CHF History: 0 HTN History: 1 Diabetes History: 0 Stroke History: 0 Vascular Disease History: 0 Age Score: 2 Gender Score: 1     Lab Results  Component Value Date   WBC 7.4 12/14/2021   HGB 13.8 12/14/2021   HCT 40.3 12/14/2021   MCV 90 12/14/2021   PLT 179 12/14/2021   Lab Results  Component Value Date   CREATININE 0.83 12/14/2021   BUN 14 12/14/2021   NA 142  12/14/2021   K 4.2 12/14/2021   CL 103 12/14/2021   CO2 24 12/14/2021   Lab Results  Component Value Date   ALT 26 07/12/2017   AST 32 07/12/2017   ALKPHOS 36 (L) 07/12/2017   BILITOT 0.8 07/12/2017   No results found for: "CHOL", "HDL", "LDLCALC", "LDLDIRECT", "TRIG", "CHOLHDL"  No results found for: "HGBA1C"  Assessment & Plan    1.  Paroxysmal atrial fibrillation: -Patient currently rate controlled with Cardizem 240 mg  -Patient denies any palpitations or shortness of breath. -She denies any  inappropriate bleeding in her urine or stool. -Continue Eliquis 5 mg daily  2.  Lower extremity edema: -Patient recently presented to ED in [place] and diagnosed with blood clot in saphenous vein on her left lower extremity. -She reports no missed doses of Eliquis -We will repeat her lower extremity Doppler today to evaluate for resolution of blood clot and both legs.   Patient will also take Lasix 20 mg for 5 days and then as needed for increased lower extremity swelling. -D-dimer today with BNP, BMET  3.  Essential hypertension: -Patient's blood pressure today was elevated at 144/70 and has been in the 120s at home. -Continue Microzide 12.5 mg and losartan 50 mg daily  4.  Mild mitral regurgitation: -2D echo completed 11/2020 with no progression of mitral regurgitation noted. -Patient is euvolemic on exam with exception of lower extremity swelling.  On exam today. -She denied any shortness of breath or dyspnea today.  5.  Pulmonary hypertension: -No evidence of pulmonic stenosis or regurg on most recent 2D echo. -Patient had no shortness of breath or chest discomfort today.  Disposition: Follow-up with Lyn Records III, MD or APP in 1 months    Medication Adjustments/Labs and Tests Ordered: Current medicines are reviewed at length with the patient today.  Concerns regarding medicines are outlined above.   Signed, necessary Napoleon Form, Leodis Rains, NP 04/18/2022, 9:41  AM [facility]  Note:  This document was prepared using Dragon voice recognition software and may include unintentional dictation errors.

## 2022-04-18 ENCOUNTER — Ambulatory Visit (HOSPITAL_COMMUNITY)
Admission: RE | Admit: 2022-04-18 | Discharge: 2022-04-18 | Disposition: A | Discharge status: Home / Self Care | Payer: Medicare Other | Source: Ambulatory Visit | Attending: Cardiology | Admitting: Cardiology

## 2022-04-18 ENCOUNTER — Encounter: Payer: Self-pay | Admitting: Nurse Practitioner

## 2022-04-18 ENCOUNTER — Ambulatory Visit: Payer: Medicare Other | Attending: Nurse Practitioner | Admitting: Nurse Practitioner

## 2022-04-18 VITALS — BP 144/70 | HR 86 | Ht <= 58 in | Wt 141.0 lb

## 2022-04-18 DIAGNOSIS — R600 Localized edema: Secondary | ICD-10-CM | POA: Diagnosis not present

## 2022-04-18 DIAGNOSIS — M7989 Other specified soft tissue disorders: Secondary | ICD-10-CM

## 2022-04-18 DIAGNOSIS — I480 Paroxysmal atrial fibrillation: Secondary | ICD-10-CM

## 2022-04-18 DIAGNOSIS — I340 Nonrheumatic mitral (valve) insufficiency: Secondary | ICD-10-CM

## 2022-04-18 DIAGNOSIS — I10 Essential (primary) hypertension: Secondary | ICD-10-CM

## 2022-04-18 DIAGNOSIS — I272 Pulmonary hypertension, unspecified: Secondary | ICD-10-CM | POA: Diagnosis not present

## 2022-04-18 LAB — D-DIMER, QUANTITATIVE: D-DIMER: 0.72 mg/L FEU — ABNORMAL HIGH (ref 0.00–0.49)

## 2022-04-18 NOTE — Addendum Note (Signed)
Addended by: Kandice Robinsons T on: 04/18/2022 09:54 AM   Modules accepted: Orders

## 2022-04-18 NOTE — Patient Instructions (Addendum)
Medication Instructions:  START Lasix 20mg  Take 1 tablet daily for 5 days then STOP *If you need a refill on your cardiac medications before your next appointment, please call your pharmacy*   Lab Work: TODAY-BMET, BNP,  & D-DIMER If you have labs (blood work) drawn today and your tests are completely normal, you will receive your results only by: MyChart Message (if you have MyChart) OR A paper copy in the mail If you have any lab test that is abnormal or we need to change your treatment, we will call you to review the results.   Testing/Procedures: Your physician has requested that you have a lower extremity venous duplex. This test is an ultrasound of the veins in the legs or arms. It looks at venous blood flow that carries blood from the heart to the legs. Allow one hour for a Lower Venous exam. There are no restrictions or special instructions.    Follow-Up: At [facility], you and your health needs are our priority.  As part of our continuing mission to provide you with exceptional heart care, we have created designated Provider Care Teams.  These Care Teams include your primary Cardiologist (physician) and Advanced Practice Providers (APPs -  Physician Assistants and Nurse Practitioners) who all work together to provide you with the care you need, when you need it.  We recommend signing up for the patient portal called "MyChart".  Sign up information is provided on this After Visit Summary.  MyChart is used to connect with patients for Virtual Visits (Telemedicine).  Patients are able to view lab/test results, encounter notes, upcoming appointments, etc.  Non-urgent messages can be sent to your provider as well.   To learn more about what you can do with MyChart, go to [facility].    Your next appointment:   1 month(s)  The format for your next appointment:   In Person  Provider:   [email or web address], NP       Other Instructions   Important Information About  Sugar

## 2022-04-19 ENCOUNTER — Telehealth: Payer: Self-pay | Admitting: Nurse Practitioner

## 2022-04-19 LAB — BASIC METABOLIC PANEL
BUN/Creatinine Ratio: 31 — ABNORMAL HIGH (ref 12–28)
BUN: 25 mg/dL (ref 8–27)
CO2: 25 mmol/L (ref 20–29)
Calcium: 8.6 mg/dL — ABNORMAL LOW (ref 8.7–10.3)
Chloride: 103 mmol/L (ref 96–106)
Creatinine, Ser: 0.8 mg/dL (ref 0.57–1.00)
Glucose: 100 mg/dL — ABNORMAL HIGH (ref 70–99)
Potassium: 4.7 mmol/L (ref 3.5–5.2)
Sodium: 139 mmol/L (ref 134–144)
eGFR: 75 mL/min/{1.73_m2} (ref >59)

## 2022-04-19 LAB — PRO B NATRIURETIC PEPTIDE: NT-Pro BNP: 717 pg/mL (ref 0–738)

## 2022-04-19 NOTE — Telephone Encounter (Signed)
  Pt's spouse calling to get results

## 2022-04-20 DIAGNOSIS — M5432 Sciatica, left side: Secondary | ICD-10-CM | POA: Diagnosis not present

## 2022-04-20 DIAGNOSIS — I10 Essential (primary) hypertension: Secondary | ICD-10-CM | POA: Diagnosis not present

## 2022-04-20 NOTE — Telephone Encounter (Signed)
See result note. Spoke with the patients spouse and gave lab results. He was agreeable and voiced understanding.

## 2022-04-21 ENCOUNTER — Telehealth: Payer: Self-pay | Admitting: Interventional Cardiology

## 2022-04-21 NOTE — Telephone Encounter (Signed)
  Per MyChart Scheduling message:   Dear Rita Myers, Thank you to you and Nurse Rhae Hammock, for providing the Lab Test results and the report on the visit to the [facility]. These reports were provided to the Primary Care Provider, Dr. Darrow Bussing on December 7, 23 during the follow up visit. Dr. Docia Chuck has asked me to discuss with you about the medications I am currently on. In particular TIADYLT - 240mg  prescribed for Blood Pressure. One other medication is LOSARTAN - 50 mg.. The clarification is sought to determine if either one or both of these medications have any possibility to contributing to water retention and swelling on both legs - upper thigs and lower leg areas. Dr. feels you should evaluate these medications, to determine if a switch to reducing dosage/changes is necessary to address the swelling and pain. Also, for your information, as a further follow up, I have been scheduled for Physical Therapy at the [facility] facility on Wednesday, December 13, 23 at 11.45am. Would appreciate receiving your advice and guidance. I thank you and Nurse [id number], for your attention and care. [name]

## 2022-04-21 NOTE — Telephone Encounter (Signed)
Message from [place] sent to patient via MyChart.

## 2022-04-21 NOTE — Telephone Encounter (Signed)
Husband stated he would like a call back to discuss questions.  Husband stated he would like to speak with Dr. Katrinka Blazing or his nurse and did not want to disclose what this is regarding.

## 2022-04-24 ENCOUNTER — Other Ambulatory Visit: Payer: Self-pay | Admitting: Family Medicine

## 2022-04-24 DIAGNOSIS — M5432 Sciatica, left side: Secondary | ICD-10-CM

## 2022-04-25 NOTE — Telephone Encounter (Signed)
Returned call to patient's spouse Rita Myers, Rita Myers per DPR.  Rita Myers states he read response from Rita Searing, Rita Myers via MyChart message on 04/21/22. He states patient will continue taking medications as they are currently prescribed and they will revisit the idea of changing medications at follow-up appt with Rita Myers on 05/26/22.  No further questions or concerns voiced at this time. Rita Myers expressed appreciation for call.

## 2022-04-26 ENCOUNTER — Other Ambulatory Visit: Payer: Self-pay

## 2022-04-26 ENCOUNTER — Ambulatory Visit: Payer: Medicare Other | Attending: Family Medicine

## 2022-04-26 DIAGNOSIS — R262 Difficulty in walking, not elsewhere classified: Secondary | ICD-10-CM | POA: Diagnosis not present

## 2022-04-26 DIAGNOSIS — M6281 Muscle weakness (generalized): Secondary | ICD-10-CM | POA: Insufficient documentation

## 2022-04-26 DIAGNOSIS — M25552 Pain in left hip: Secondary | ICD-10-CM | POA: Insufficient documentation

## 2022-04-26 DIAGNOSIS — R252 Cramp and spasm: Secondary | ICD-10-CM

## 2022-04-26 NOTE — Therapy (Signed)
OUTPATIENT PHYSICAL THERAPY THORACOLUMBAR EVALUATION   Patient Name: Rita Myers MRN: [id number] DOB:1945/10/12, 76 y.o., female Today's Date: 04/26/2022  END OF SESSION:  PT End of Session - 04/26/22 1151     Visit Number 1    Date for PT Re-Evaluation 06/21/22    Authorization Type [employer]    Progress Note Due on Visit 10    PT Start Time 1148    PT Stop Time 1250    PT Time Calculation (min) 62 min    Activity Tolerance Patient limited by pain    Behavior During Therapy [facility] for tasks assessed/performed             Past Medical History:  Diagnosis Date   Aortic insufficiency    Essential hypertension 07/11/2017   Hyperlipemia    Hypertension    Hypokalemia 07/11/2017   Mitral regurgitation    Nausea, vomiting, and diarrhea 07/11/2017   Paroxysmal A-fib (HCC) 07/11/2017   Pulmonary hypertension (HCC)    Syncope, vasovagal 07/11/2017   History reviewed. No pertinent surgical history. 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   A-fib (HCC) 07/11/2017   Syncope, vasovagal 07/11/2017   Nausea, vomiting, and diarrhea 07/11/2017   Hypokalemia 07/11/2017   Essential hypertension 07/11/2017    PCP: Darrow Bussing, MD   REFERRING PROVIDER: Darrow Bussing, MD   REFERRING DIAG:  Diagnosis  M25.552 (ICD-10-CM) - Pain in left hip    Rationale for Evaluation and Treatment: Rehabilitation  THERAPY DIAG:  Pain in left hip  Difficulty in walking, not elsewhere classified  Muscle weakness (generalized)  Cramp and spasm  ONSET DATE: 04/13/2022  SUBJECTIVE:                                                                                                                                                                                            SUBJECTIVE STATEMENT: Spouse says he and his wife went to [place] to visit their dtr when she started having pain.  She was evaluated in [place] and was found to have small clot but she was already on Eliquis.  She was examined again once she got home and no clot was detected.    Patient reports her left hip has been painful for about 2 months.  Pain mostly when standing still, minor relief with walking.  She initially thought   PERTINENT HISTORY:  Hx of small clot found in [place] but resolved per  MD once re-assessed in [place].    PAIN:  Are you having pain? Yes: NPRS scale: 8/10 Pain location: left knee and whole leg Pain description: aching Aggravating factors: standing, walking Relieving factors: Tylenol,   PRECAUTIONS: None  WEIGHT BEARING RESTRICTIONS: No  FALLS:  Has patient fallen in last 6 months? No  LIVING ENVIRONMENT: Lives with: lives with their family and lives with their spouse Lives in: House/apartment Stairs: Yes: Internal: 0 steps; none and External: 18 steps; can reach both Has following equipment at home: None  OCCUPATION: retired  PLOF: Independent, Independent with basic ADLs, Independent with household mobility without device, Independent with community mobility without device, Independent with homemaking with ambulation, Independent with gait, and Independent with transfers  PATIENT GOALS: To be able to walk and stand without pain  NEXT MD VISIT: PRN  OBJECTIVE:   DIAGNOSTIC FINDINGS:  MRI scheduled, Xray findings below: 04/13/2022 IMPRESSION: Normal left hip.  PATIENT SURVEYS:  FOTO 31 predicted 38  SCREENING FOR RED FLAGS: Bowel or bladder incontinence: No Spinal tumors: No Cauda equina syndrome: No Compression fracture: No Abdominal aneurysm: No  COGNITION: Overall cognitive status: Within functional limits for tasks assessed     SENSATION: WFL   POSTURE:  toeing in, bilateral knee varus L >  R  PALPATION: Palpable crepitus in left knee on seated open chain flexion/extension   LOWER EXTREMITY ROM:     WFL  LOWER EXTREMITY MMT:    Right LE generally 4+/5,  Left knee extension and flexion 4-/5, left hip generally 4/5  FUNCTIONAL TESTS:  5 times sit to stand: 25.80 sec  Timed up and go (TUG): 25.91 sec  GAIT: Distance walked: 100 Assistive device utilized:  had patient walk without then with rolling walker  Level of assistance:  cga needed without a.d, with walker modified independent Comments: patient toes in and scissors, needed verbal cues for widening base of support  TODAY'S TREATMENT:                                                                                                                              DATE: 04/26/22 Initial eval completed and initiated HEP    PATIENT EDUCATION:  Education details: Initiated HEP Person educated: Patient Education method: Programmer, multimedia, Facilities manager, Verbal cues, and Handouts Education comprehension: verbalized understanding, returned demonstration, and verbal cues required  HOME EXERCISE PROGRAM: Access Code: [email or web address] URL: [email or web address] Date: 04/26/2022 Prepared by: Mikey Kirschner  Exercises - Seated Long Arc Quad  - 1 x daily - 7 x weekly - 3 sets - 10 reps - Seated Ankle Pumps  - 1 x daily - 7 x weekly - 3 sets - 10 reps - Seated Knee Extension Stretch with Chair  - 1 x daily - 7 x weekly - 1 sets - 1 reps - up to 30 min hold - Supine Knee Extension Stretch on Towel Roll  - 1 x daily - 7 x weekly - 1 sets - 1  reps - up to 30 min hold ASSESSMENT:  CLINICAL IMPRESSION: Patient is a 76 y.o. female who was seen today for physical therapy evaluation and treatment for left hip pain.  As we progressed through the evaluation, patient's symptoms appear to be pointing more to knee issues and possible Baker's cyst.  Hip xrays show no issues with the left hip.  She is scheduled for MRI of the left hip  to rule out other pathology.  We will focus on bilateral LE strength and focused left quad rehab.  She would benefit from skilled PT to address the above issues and restore patient to prior level of function.     OBJECTIVE IMPAIRMENTS: decreased mobility, difficulty walking, decreased strength, hypomobility, increased fascial restrictions, increased muscle spasms, impaired flexibility, and pain.   ACTIVITY LIMITATIONS: carrying, lifting, bending, sitting, standing, squatting, sleeping, stairs, transfers, bed mobility, and dressing  PARTICIPATION LIMITATIONS: meal prep, cleaning, laundry, driving, shopping, community activity, and yard work  PERSONAL FACTORS: Age, Fitness, Time since onset of injury/illness/exacerbation, and 1-2 comorbidities: A-fib, Htn  are also affecting patient's functional outcome.   REHAB POTENTIAL: Good  CLINICAL DECISION MAKING: Stable/uncomplicated  EVALUATION COMPLEXITY: Low   GOALS: Goals reviewed with patient? Yes  SHORT TERM GOALS: Target date: 05/24/2022    Pain report to be no greater than 4/10  Baseline: Goal status: INITIAL  2.  Patient will be independent with initial HEP  Baseline:  Goal status: INITIAL   LONG TERM GOALS: Target date: 06/21/2022    Patient to report pain no greater than 2/10  Baseline:  Goal status: INITIAL  2.  Patient to be independent with advanced HEP  Baseline:  Goal status: INITIAL  3.  FOTO score to improve to predicted score Baseline:  Goal status: INITIAL  4.  Patient to report 85% improvement in overall symptoms  Baseline:  Goal status: INITIAL  5.  Functional scores to improve by 2-3 seconds Baseline:  Goal status: INITIAL  6.  Patient to begin routine walking schedule Baseline:  Goal status: INITIAL  PLAN:  PT FREQUENCY: 1-2x/week  PT DURATION: 8 weeks  PLANNED INTERVENTIONS: Therapeutic exercises, Therapeutic activity, Neuromuscular re-education, Balance training, Gait training,  Patient/Family education, Self Care, Joint mobilization, Stair training, DME instructions, Aquatic Therapy, Dry Needling, Electrical stimulation, Spinal mobilization, Cryotherapy, Moist heat, Taping, Traction, Ultrasound, Ionotophoresis 4mg /ml Dexamethasone, Manual therapy, and Re-evaluation.  PLAN FOR NEXT SESSION: Review HEP, add in quad strengthening, Nustep   Samanthan Dugo B. Rishawn Walck, PT 04/26/22 1:17 PM  [facility] [address] [place], [place] Phone # [phone] Fax [phone]

## 2022-04-27 ENCOUNTER — Ambulatory Visit: Payer: Medicare Other | Admitting: Physical Therapy

## 2022-04-27 DIAGNOSIS — M6281 Muscle weakness (generalized): Secondary | ICD-10-CM

## 2022-04-27 DIAGNOSIS — M25552 Pain in left hip: Secondary | ICD-10-CM | POA: Diagnosis not present

## 2022-04-27 DIAGNOSIS — R252 Cramp and spasm: Secondary | ICD-10-CM

## 2022-04-27 DIAGNOSIS — R262 Difficulty in walking, not elsewhere classified: Secondary | ICD-10-CM

## 2022-04-27 NOTE — Therapy (Signed)
OUTPATIENT PHYSICAL THERAPY THORACOLUMBAR PROGRESS NOTE Patient Name: Rita Myers MRN: [id number] DOB:1946-03-16, 76 y.o., female Today's Date: 04/27/2022  END OF SESSION:  PT End of Session - 04/27/22 1356     Visit Number 2    Date for PT Re-Evaluation 06/21/22    Authorization Type [employer]    Progress Note Due on Visit 10    PT Start Time 1400    PT Stop Time 1440    PT Time Calculation (min) 40 min    Activity Tolerance Patient tolerated treatment well             Past Medical History:  Diagnosis Date   Aortic insufficiency    Essential hypertension 07/11/2017   Hyperlipemia    Hypertension    Hypokalemia 07/11/2017   Mitral regurgitation    Nausea, vomiting, and diarrhea 07/11/2017   Paroxysmal A-fib (HCC) 07/11/2017   Pulmonary hypertension (HCC)    Syncope, vasovagal 07/11/2017   No past surgical history on file. 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   A-fib (HCC) 07/11/2017   Syncope, vasovagal 07/11/2017   Nausea, vomiting, and diarrhea 07/11/2017   Hypokalemia 07/11/2017   Essential hypertension 07/11/2017    PCP: Darrow Bussing, MD   REFERRING PROVIDER: Darrow Bussing, MD   REFERRING DIAG:  Diagnosis  M25.552 (ICD-10-CM) - Pain in left hip    Rationale for Evaluation and Treatment: Rehabilitation  THERAPY DIAG:  Pain in left hip  Difficulty in walking, not elsewhere classified  Muscle weakness (generalized)  Cramp and spasm  ONSET DATE: 04/13/2022  SUBJECTIVE:                                                                                                                                                                                           SUBJECTIVE STATEMENT: Left hip, thigh,  lower leg pain sometimes to foot with standing and walking.  Better with movement.     PERTINENT HISTORY:  Hx of small clot found in [place] but resolved per MD once re-assessed in [place].   MRI on 12/29 of spine PAIN:  Are you having pain? Yes: NPRS scale: 6/10 Pain location: left knee and whole leg Pain description: aching Aggravating factors: standing, walking Relieving factors: Tylenol,   PRECAUTIONS: None  WEIGHT BEARING RESTRICTIONS: No  FALLS:  Has patient fallen in last 6 months? No  LIVING ENVIRONMENT: Lives with: lives with their family and lives with their  spouse Lives in: House/apartment Stairs: Yes: Internal: 0 steps; none and External: 18 steps; can reach both Has following equipment at home: None  OCCUPATION: retired  PLOF: Independent, Independent with basic ADLs, Independent with household mobility without device, Independent with community mobility without device, Independent with homemaking with ambulation, Independent with gait, and Independent with transfers  PATIENT GOALS: To be able to walk and stand without pain  NEXT MD VISIT: PRN  OBJECTIVE:   DIAGNOSTIC FINDINGS:  MRI scheduled, Xray findings below: 04/13/2022 IMPRESSION: Normal left hip.  PATIENT SURVEYS:  FOTO 31 predicted 81  SCREENING FOR RED FLAGS: Bowel or bladder incontinence: No Spinal tumors: No Cauda equina syndrome: No Compression fracture: No Abdominal aneurysm: No  COGNITION: Overall cognitive status: Within functional limits for tasks assessed     SENSATION: WFL   POSTURE:  toeing in, bilateral knee varus L > R  PALPATION: Palpable crepitus in left knee on seated open chain flexion/extension   LOWER EXTREMITY ROM:     WFL  LOWER EXTREMITY MMT:    Right LE generally 4+/5,  Left knee extension and flexion 4-/5, left hip generally 4/5  FUNCTIONAL TESTS:  5 times sit to stand: 25.80 sec  Timed up and go (TUG): 25.91 sec  GAIT: Distance walked:  100 Assistive device utilized:  had patient walk without then with rolling walker  Level of assistance:  cga needed without a.d, with walker modified independent Comments: patient toes in and scissors, needed verbal cues for widening base of support  TODAY'S TREATMENT:        DATE: 04/27/22 Review of initial HEP: LAQ, seated ankle pumps, supine knee extension Supine neural floss hold behind knee  Supine ball squeeze 10x Supine glute squeeze 10x  Sidelying clam 20x ( no problem)  Manual hip extension resisted 10x Sidelying hip abduction 5x (difficult but not painful) Sit to stand from mat table 5x Seated floss: left knee extension with head up 8x Standing heel raises 15x holding the back of the chair Nu-Step L1 seat 4 (old model) 7 min  (no pain)                                                                                                                            DATE: 04/26/22 Initial eval completed and initiated HEP    PATIENT EDUCATION:  Education details: Initiated HEP Person educated: Patient Education method: Programmer, multimedia, Facilities manager, Verbal cues, and Handouts Education comprehension: verbalized understanding, returned demonstration, and verbal cues required  HOME EXERCISE PROGRAM: Access Code: [email or web address] URL: [email or web address] Date: 04/27/2022 Prepared by: Lavinia Sharps  Exercises - Seated Long Arc Quad  - 1 x daily - 7 x weekly - 3 sets - 10 reps - Seated Ankle Pumps  - 1 x daily - 7 x weekly - 3 sets - 10 reps - Seated Knee Extension Stretch with Chair  - 1 x daily - 7 x weekly - 1 sets - 1 reps - up to 30 min  hold - Supine Knee Extension Stretch on Towel Roll  - 1 x daily - 7 x weekly - 1 sets - 1 reps - up to 30 min hold - Supine Sciatic Nerve Glide  - 1 x daily - 7 x weekly - 1 sets - 10 reps - Supine Hip Adduction Isometric with Ball  - 1 x daily - 7 x weekly - 3 sets - 10 reps - Clamshell  - 1 x daily - 7 x weekly - 1 sets - 10 reps -  Seated Slump Nerve Glide  - 1 x daily - 7 x weekly - 1 sets - 10 reps ASSESSMENT:  CLINICAL IMPRESSION: Neural tension noted in left LE.  Encouraged low level neural gliding (low intensity) in seated and supine positions.  Therapist progressing and updating HEP to include these.  Therapist monitoring response  and modifying treatment accordingly.      OBJECTIVE IMPAIRMENTS: decreased mobility, difficulty walking, decreased strength, hypomobility, increased fascial restrictions, increased muscle spasms, impaired flexibility, and pain.   ACTIVITY LIMITATIONS: carrying, lifting, bending, sitting, standing, squatting, sleeping, stairs, transfers, bed mobility, and dressing  PARTICIPATION LIMITATIONS: meal prep, cleaning, laundry, driving, shopping, community activity, and yard work  PERSONAL FACTORS: Age, Fitness, Time since onset of injury/illness/exacerbation, and 1-2 comorbidities: A-fib, Htn  are also affecting patient's functional outcome.   REHAB POTENTIAL: Good  CLINICAL DECISION MAKING: Stable/uncomplicated  EVALUATION COMPLEXITY: Low   GOALS: Goals reviewed with patient? Yes  SHORT TERM GOALS: Target date: 05/24/2022    Pain report to be no greater than 4/10  Baseline: Goal status: INITIAL  2.  Patient will be independent with initial HEP  Baseline:  Goal status: INITIAL   LONG TERM GOALS: Target date: 06/21/2022    Patient to report pain no greater than 2/10  Baseline:  Goal status: INITIAL  2.  Patient to be independent with advanced HEP  Baseline:  Goal status: INITIAL  3.  FOTO score to improve to predicted score Baseline:  Goal status: INITIAL  4.  Patient to report 85% improvement in overall symptoms  Baseline:  Goal status: INITIAL  5.  Functional scores to improve by 2-3 seconds Baseline:  Goal status: INITIAL  6.  Patient to begin routine walking schedule Baseline:  Goal status: INITIAL  PLAN:  PT FREQUENCY: 1-2x/week  PT DURATION: 8  weeks  PLANNED INTERVENTIONS: Therapeutic exercises, Therapeutic activity, Neuromuscular re-education, Balance training, Gait training, Patient/Family education, Self Care, Joint mobilization, Stair training, DME instructions, Aquatic Therapy, Dry Needling, Electrical stimulation, Spinal mobilization, Cryotherapy, Moist heat, Taping, Traction, Ultrasound, Ionotophoresis 4mg /ml Dexamethasone, Manual therapy, and Re-evaluation.  PLAN FOR NEXT SESSION: Review HEP, neural gliding low intensity secondary to irritability;  low level LE muscle pumping ex; , PT 04/27/22 5:19 PM Phone: [phone] Fax: [phone]

## 2022-05-02 ENCOUNTER — Ambulatory Visit: Payer: Medicare Other | Admitting: Rehabilitative and Restorative Service Providers"

## 2022-05-02 ENCOUNTER — Encounter: Payer: Self-pay | Admitting: Rehabilitative and Restorative Service Providers"

## 2022-05-02 DIAGNOSIS — M6281 Muscle weakness (generalized): Secondary | ICD-10-CM

## 2022-05-02 DIAGNOSIS — M25552 Pain in left hip: Secondary | ICD-10-CM

## 2022-05-02 DIAGNOSIS — R252 Cramp and spasm: Secondary | ICD-10-CM

## 2022-05-02 DIAGNOSIS — R262 Difficulty in walking, not elsewhere classified: Secondary | ICD-10-CM | POA: Diagnosis not present

## 2022-05-02 NOTE — Therapy (Signed)
OUTPATIENT PHYSICAL THERAPY TREATMENT NOTE Patient Name: Rita Myers MRN: [id number] DOB:09-20-1945, 76 y.o., female Today's Date: 05/02/2022  END OF SESSION:  PT End of Session - 05/02/22 1149     Visit Number 3    Date for PT Re-Evaluation 06/21/22    Authorization Type [employer]ICARE    Progress Note Due on Visit 10    PT Start Time 1145    PT Stop Time 1225    PT Time Calculation (min) 40 min    Activity Tolerance Patient tolerated treatment well    Behavior During Therapy [facility] for tasks assessed/performed             Past Medical History:  Diagnosis Date   Aortic insufficiency    Essential hypertension 07/11/2017   Hyperlipemia    Hypertension    Hypokalemia 07/11/2017   Mitral regurgitation    Nausea, vomiting, and diarrhea 07/11/2017   Paroxysmal A-fib (HCC) 07/11/2017   Pulmonary hypertension (HCC)    Syncope, vasovagal 07/11/2017   History reviewed. No pertinent surgical history. 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   A-fib (HCC) 07/11/2017   Syncope, vasovagal 07/11/2017   Nausea, vomiting, and diarrhea 07/11/2017   Hypokalemia 07/11/2017   Essential hypertension 07/11/2017    PCP: Darrow Bussing, MD   REFERRING PROVIDER: Darrow Bussing, MD   REFERRING DIAG:  Diagnosis  M25.552 (ICD-10-CM) - Pain in left hip    Rationale for Evaluation and Treatment: Rehabilitation  THERAPY DIAG:  Pain in left hip  Difficulty in walking, not elsewhere classified  Muscle weakness (generalized)  Cramp and spasm  ONSET DATE: 04/13/2022  SUBJECTIVE:                                                                                                                                                                                            SUBJECTIVE STATEMENT: Pt states that she can feel that her pain is getting better.   PERTINENT HISTORY:  Hx of small clot found in [place] but resolved per MD once re-assessed in [place].   MRI on 12/29 of spine PAIN:  Are you having pain? Yes: NPRS scale: 6/10 Pain location: left knee and whole leg Pain description: aching Aggravating factors: standing, walking Relieving factors: Tylenol,   PRECAUTIONS: None  WEIGHT BEARING RESTRICTIONS: No  FALLS:  Has patient fallen in last 6 months? No  LIVING ENVIRONMENT: Lives with: lives with their family and lives  with their spouse Lives in: House/apartment Stairs: Yes: Internal: 0 steps; none and External: 18 steps; can reach both Has following equipment at home: None  OCCUPATION: retired  PLOF: Independent, Independent with basic ADLs, Independent with household mobility without device, Independent with community mobility without device, Independent with homemaking with ambulation, Independent with gait, and Independent with transfers  PATIENT GOALS: To be able to walk and stand without pain  NEXT MD VISIT: PRN  OBJECTIVE:   DIAGNOSTIC FINDINGS:  MRI scheduled, Xray findings below: 04/13/2022 IMPRESSION: Normal left hip.  PATIENT SURVEYS:  Eval:  FOTO 31 predicted 56  POSTURE:  toeing in, bilateral knee varus L > R  PALPATION: Palpable crepitus in left knee on seated open chain flexion/extension   LOWER EXTREMITY ROM:     [facility]  05/02/2022: Right hamstring length: 60 degrees Left hamstring length: 60 degrees with increased pain  LOWER EXTREMITY MMT:    04/26/2022:  Right LE generally 4+/5,  Left knee extension and flexion 4-/5, left hip generally 4/5  FUNCTIONAL TESTS:  04/26/2022: 5 times sit to stand: 25.80 sec  Timed up and go (TUG): 25.91 sec  GAIT: Distance walked: 100 Assistive device utilized:  had patient walk without then with rolling walker  Level of assistance:  cga  needed without a.d, with walker modified independent Comments: patient toes in and scissors, needed verbal cues for widening base of support  TODAY'S TREATMENT:         DATE: 05/02/22 Nustep level 4 (seat at 5 on green machine) x6 min with PT present to discuss status Seated hip adduction ball squeeze 2x10 Seated hip abduction clamshell with yellow tband 2x10 Seated with 1.5#:  heel/toe raises, marching, LAQ.  BLE 2x10 Supine hamstring stretch with strap 2x20 sec bilat Supine glute sets 2x10 Supine straight leg raise 2x10 bilat   DATE: 04/27/22 Review of initial HEP: LAQ, seated ankle pumps, supine knee extension Supine neural floss hold behind knee  Supine ball squeeze 10x Supine glute squeeze 10x  Sidelying clam 20x ( no problem)  Manual hip extension resisted 10x Sidelying hip abduction 5x (difficult but not painful) Sit to stand from mat table 5x Seated floss: left knee extension with head up 8x Standing heel raises 15x holding the back of the chair Nu-Step L1 seat 4 (old model) 7 min  (no pain)                                                                                                                         DATE: 04/26/22 Initial eval completed and initiated HEP    PATIENT EDUCATION:  Education details: Initiated HEP Person educated: Patient Education method: Programmer, multimedia, Facilities manager, Verbal cues, and Handouts Education comprehension: verbalized understanding, returned demonstration, and verbal cues required  HOME EXERCISE PROGRAM: Access Code: [email or web address] URL: [email or web address] Date: 04/27/2022 Prepared by: Lavinia Sharps  Exercises - Seated Long Arc Quad  - 1 x daily - 7 x weekly - 3 sets - 10 reps - Seated Ankle  Pumps  - 1 x daily - 7 x weekly - 3 sets - 10 reps - Seated Knee Extension Stretch with Chair  - 1 x daily - 7 x weekly - 1 sets - 1 reps - up to 30 min hold - Supine Knee Extension Stretch on Towel Roll  - 1 x daily - 7 x weekly -  1 sets - 1 reps - up to 30 min hold - Supine Sciatic Nerve Glide  - 1 x daily - 7 x weekly - 1 sets - 10 reps - Supine Hip Adduction Isometric with Ball  - 1 x daily - 7 x weekly - 3 sets - 10 reps - Clamshell  - 1 x daily - 7 x weekly - 1 sets - 10 reps - Seated Slump Nerve Glide  - 1 x daily - 7 x weekly - 1 sets - 10 reps  ASSESSMENT:  CLINICAL IMPRESSION: Ms Duce presents to skilled PT reporting that she can tell that she is making progress towards improved strength and decreased pain.  Patient able to progress with strengthening during PT session today.  Patient with most difficulty with left hamstring stretch secondary to pain, but did note both 60 degrees with hamstring flexibility on each side.  Patient continues to require skilled PT to progress towards goal related activities.    OBJECTIVE IMPAIRMENTS: decreased mobility, difficulty walking, decreased strength, hypomobility, increased fascial restrictions, increased muscle spasms, impaired flexibility, and pain.   ACTIVITY LIMITATIONS: carrying, lifting, bending, sitting, standing, squatting, sleeping, stairs, transfers, bed mobility, and dressing  PARTICIPATION LIMITATIONS: meal prep, cleaning, laundry, driving, shopping, community activity, and yard work  PERSONAL FACTORS: Age, Fitness, Time since onset of injury/illness/exacerbation, and 1-2 comorbidities: A-fib, Htn  are also affecting patient's functional outcome.   REHAB POTENTIAL: Good  CLINICAL DECISION MAKING: Stable/uncomplicated  EVALUATION COMPLEXITY: Low   GOALS: Goals reviewed with patient? Yes  SHORT TERM GOALS: Target date: 05/24/2022    Pain report to be no greater than 4/10  Baseline: Goal status: IN PROGRESS  2.  Patient will be independent with initial HEP  Baseline:  Goal status: IN PROGRESS   LONG TERM GOALS: Target date: 06/21/2022    Patient to report pain no greater than 2/10  Baseline:  Goal status: INITIAL  2.  Patient to be  independent with advanced HEP  Baseline:  Goal status: INITIAL  3.  FOTO score to improve to predicted score Baseline:  Goal status: INITIAL  4.  Patient to report 85% improvement in overall symptoms  Baseline:  Goal status: INITIAL  5.  Functional scores to improve by 2-3 seconds Baseline:  Goal status: INITIAL  6.  Patient to begin routine walking schedule Baseline:  Goal status: INITIAL  PLAN:  PT FREQUENCY: 1-2x/week  PT DURATION: 8 weeks  PLANNED INTERVENTIONS: Therapeutic exercises, Therapeutic activity, Neuromuscular re-education, Balance training, Gait training, Patient/Family education, Self Care, Joint mobilization, Stair training, DME instructions, Aquatic Therapy, Dry Needling, Electrical stimulation, Spinal mobilization, Cryotherapy, Moist heat, Taping, Traction, Ultrasound, Ionotophoresis 4mg /ml Dexamethasone, Manual therapy, and Re-evaluation.  PLAN FOR NEXT SESSION: Review HEP, neural gliding low intensity secondary to irritability;  low level LE muscle pumping ex; , PT 05/02/22 12:58 PM  [facility] [address] [place], [place] Phone # [phone] Fax [phone]

## 2022-05-05 ENCOUNTER — Ambulatory Visit: Payer: Medicare Other | Admitting: Physical Therapy

## 2022-05-05 DIAGNOSIS — M25552 Pain in left hip: Secondary | ICD-10-CM | POA: Diagnosis not present

## 2022-05-05 DIAGNOSIS — R262 Difficulty in walking, not elsewhere classified: Secondary | ICD-10-CM

## 2022-05-05 DIAGNOSIS — M6281 Muscle weakness (generalized): Secondary | ICD-10-CM | POA: Diagnosis not present

## 2022-05-05 NOTE — Therapy (Addendum)
OUTPATIENT PHYSICAL THERAPY TREATMENT NOTE/DISCHARGE SUMMARY Patient Name: Rita Myers MRN: [id number] DOB:1945-06-25, 76 y.o., female Today's Date: 05/05/2022  END OF SESSION:  PT End of Session - 05/05/22 1018     Visit Number 4    Date for PT Re-Evaluation 06/21/22    Authorization Type [employer] MEDICARE    Progress Note Due on Visit 10    PT Start Time 1015    PT Stop Time 1055    PT Time Calculation (min) 40 min    Activity Tolerance Patient tolerated treatment well             Past Medical History:  Diagnosis Date   Aortic insufficiency    Essential hypertension 07/11/2017   Hyperlipemia    Hypertension    Hypokalemia 07/11/2017   Mitral regurgitation    Nausea, vomiting, and diarrhea 07/11/2017   Paroxysmal A-fib (HCC) 07/11/2017   Pulmonary hypertension (HCC)    Syncope, vasovagal 07/11/2017   No past surgical history on file. 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   A-fib (HCC) 07/11/2017   Syncope, vasovagal 07/11/2017   Nausea, vomiting, and diarrhea 07/11/2017   Hypokalemia 07/11/2017   Essential hypertension 07/11/2017    PCP: Darrow Bussing, MD   REFERRING PROVIDER: Darrow Bussing, MD   REFERRING DIAG:  Diagnosis  M25.552 (ICD-10-CM) - Pain in left hip    Rationale for Evaluation and Treatment: Rehabilitation  THERAPY DIAG:  Pain in left hip  Difficulty in walking, not elsewhere classified  Muscle weakness (generalized)  ONSET DATE: 04/13/2022  SUBJECTIVE:                                                                                                                                                                                           SUBJECTIVE STATEMENT: Patient reports less pain  overall and no pain at the moment.   PERTINENT HISTORY:  Hx of small clot found in [place] but resolved per MD once re-assessed in [place].   MRI on 12/29 of spine PAIN:  Are you having pain? Yes: NPRS scale: 0/10 Pain location: left knee and whole leg Pain description: aching Aggravating factors: standing, walking Relieving factors: Tylenol,   PRECAUTIONS: None  WEIGHT BEARING RESTRICTIONS: No  FALLS:  Has patient fallen in last 6 months? No  LIVING ENVIRONMENT: Lives with: lives with their family and lives with their spouse Lives in: House/apartment Stairs: Yes: Internal: 0 steps; none and External:  18 steps; can reach both Has following equipment at home: None  OCCUPATION: retired  PLOF: Independent, Independent with basic ADLs, Independent with household mobility without device, Independent with community mobility without device, Independent with homemaking with ambulation, Independent with gait, and Independent with transfers  PATIENT GOALS: To be able to walk and stand without pain  NEXT MD VISIT: PRN  OBJECTIVE:   DIAGNOSTIC FINDINGS:  MRI scheduled, Xray findings below: 04/13/2022 IMPRESSION: Normal left hip.  PATIENT SURVEYS:  Eval:  FOTO 31 predicted 56  POSTURE:  toeing in, bilateral knee varus L > R  PALPATION: Palpable crepitus in left knee on seated open chain flexion/extension   LOWER EXTREMITY ROM:     [facility]  05/02/2022: Right hamstring length: 60 degrees Left hamstring length: 60 degrees with increased pain  LOWER EXTREMITY MMT:    04/26/2022:  Right LE generally 4+/5,  Left knee extension and flexion 4-/5, left hip generally 4/5  FUNCTIONAL TESTS:  04/26/2022: 5 times sit to stand: 25.80 sec  Timed up and go (TUG): 25.91 sec  GAIT: Distance walked: 100 Assistive device utilized:  had patient walk without then with rolling walker  Level of assistance:  cga needed without a.d, with walker modified independent Comments: patient toes  in and scissors, needed verbal cues for widening base of support  TODAY'S TREATMENT:        DATE: 05/05/22 Nustep level 4 (seat at 5 on green machine) x7 min with PT present to discuss status Seated nerve floss 10x on right  Long sit on edge of mat table with ankle DF/PF 10x right/left Seated rocker board 1 minute Seated hip adduction ball squeeze 2x10 Seated hip abduction clamshell with red tband 2x10 Seated knee flexion on floor slider red band 10x Seated knee extension on floor slider red band 10x Sit to stand from mat table 5x (knee painful even with wide stance) Standing at the railing heel raises 10x Standing at the railing left hip abduction 10x Standing at the railing left hip extension 10x Seated 3# weight on knee: hip flexion up and over low cone 15x left only Seated green loop "light" ankle eversion bil 15x Patient given red band for home use   DATE: 05/02/22 Nustep level 4 (seat at 5 on green machine) x6 min with PT present to discuss status Seated hip adduction ball squeeze 2x10 Seated hip abduction clamshell with yellow tband 2x10 Seated with 1.5#:  heel/toe raises, marching, LAQ.  BLE 2x10 Supine hamstring stretch with strap 2x20 sec bilat Supine glute sets 2x10 Supine straight leg raise 2x10 bilat   DATE: 04/27/22 Review of initial HEP: LAQ, seated ankle pumps, supine knee extension Supine neural floss hold behind knee  Supine ball squeeze 10x Supine glute squeeze 10x  Sidelying clam 20x ( no problem)  Manual hip extension resisted 10x Sidelying hip abduction 5x (difficult but not painful) Sit to stand from mat table 5x Seated floss: left knee extension with head up 8x Standing heel raises 15x holding the back of the chair Nu-Step L1 seat 4 (old model) 7 min  (no pain)  DATE: 04/26/22 Initial eval completed and initiated HEP    PATIENT  EDUCATION:  Education details: Initiated HEP Person educated: Patient Education method: Programmer, multimediaxplanation, Facilities managerDemonstration, Verbal cues, and Handouts Education comprehension: verbalized understanding, returned demonstration, and verbal cues required  HOME EXERCISE PROGRAM: Access Code: [email or web address] URL: [email or web address] Date: 05/05/2022 Prepared by: Lavinia SharpsStacy Shion Bluestein  Exercises - Seated Long Arc Quad  - 1 x daily - 7 x weekly - 3 sets - 10 reps - Seated Ankle Pumps  - 1 x daily - 7 x weekly - 3 sets - 10 reps - Seated Knee Extension Stretch with Chair  - 1 x daily - 7 x weekly - 1 sets - 1 reps - up to 30 min hold - Supine Knee Extension Stretch on Towel Roll  - 1 x daily - 7 x weekly - 1 sets - 1 reps - up to 30 min hold - Supine Sciatic Nerve Glide  - 1 x daily - 7 x weekly - 1 sets - 10 reps - Supine Hip Adduction Isometric with Ball  - 1 x daily - 7 x weekly - 3 sets - 10 reps - Clamshell  - 1 x daily - 7 x weekly - 1 sets - 10 reps - Seated Slump Nerve Glide  - 1 x daily - 7 x weekly - 1 sets - 10 reps - Seated Hip Abduction with Resistance  - 1 x daily - 7 x weekly - 1 sets - 10 reps - Seated Hamstring Curl with Anchored Resistance  - 1 x daily - 7 x weekly - 1 sets - 10 reps - Seated Ankle Eversion with Resistance  - 1 x daily - 7 x weekly - 1 sets - 10 reps  ASSESSMENT:  CLINICAL IMPRESSION: Patient states her leg is still painful but not nearly as bad as when she first came to PT. Therapist progressing and updating HEP for increased intensity and challenge level for further strengthening and functional mobility.   Therapist monitoring response to all interventions and modifying treatment accordingly.      OBJECTIVE IMPAIRMENTS: decreased mobility, difficulty walking, decreased strength, hypomobility, increased fascial restrictions, increased muscle spasms, impaired flexibility, and pain.   ACTIVITY LIMITATIONS: carrying, lifting, bending, sitting, standing, squatting,  sleeping, stairs, transfers, bed mobility, and dressing  PARTICIPATION LIMITATIONS: meal prep, cleaning, laundry, driving, shopping, community activity, and yard work  PERSONAL FACTORS: Age, Fitness, Time since onset of injury/illness/exacerbation, and 1-2 comorbidities: A-fib, Htn  are also affecting patient's functional outcome.   REHAB POTENTIAL: Good  CLINICAL DECISION MAKING: Stable/uncomplicated  EVALUATION COMPLEXITY: Low   GOALS: Goals reviewed with patient? Yes  SHORT TERM GOALS: Target date: 05/24/2022    Pain report to be no greater than 4/10  Baseline: Goal status: IN PROGRESS  2.  Patient will be independent with initial HEP  Baseline:  Goal status: IN PROGRESS   LONG TERM GOALS: Target date: 06/21/2022    Patient to report pain no greater than 2/10  Baseline:  Goal status: INITIAL  2.  Patient to be independent with advanced HEP  Baseline:  Goal status: INITIAL  3.  FOTO score to improve to predicted score Baseline:  Goal status: INITIAL  4.  Patient to report 85% improvement in overall symptoms  Baseline:  Goal status: INITIAL  5.  Functional scores to improve by 2-3 seconds Baseline:  Goal status: INITIAL  6.  Patient to begin routine walking schedule Baseline:  Goal status: INITIAL  PLAN:  PT FREQUENCY: 1-2x/week  PT  DURATION: 8 weeks  PLANNED INTERVENTIONS: Therapeutic exercises, Therapeutic activity, Neuromuscular re-education, Balance training, Gait training, Patient/Family education, Self Care, Joint mobilization, Stair training, DME instructions, Aquatic Therapy, Dry Needling, Electrical stimulation, Spinal mobilization, Cryotherapy, Moist heat, Taping, Traction, Ultrasound, Ionotophoresis 4mg /ml Dexamethasone, Manual therapy, and Re-evaluation.  PLAN FOR NEXT SESSION: Review HEP, neural gliding low intensity secondary to irritability;  low level LE muscle pumping ex; , PT 05/05/22 12:05 PM Phone:  [phone] Fax: [phone]  [facility] [address] [place], [place] Phone # [phone] Fax [phone] PHYSICAL THERAPY DISCHARGE SUMMARY  Visits from Start of Care: 4  Current functional level related to goals / functional outcomes: The patient's husband cancelled all appts in January secondary to patient following up with another doctor regarding edema.  She then called back to request discharge from PT   Remaining deficits: As above   Education / Equipment: Basic HEP   Patient agrees to discharge. Patient goals were not met. Patient is being discharged due to the patient's request.  February, PT 06/15/22 10:14 AM Phone: [phone] Fax: [phone]

## 2022-05-11 ENCOUNTER — Ambulatory Visit: Payer: Medicare Other | Admitting: Physical Therapy

## 2022-05-12 ENCOUNTER — Ambulatory Visit
Admission: RE | Admit: 2022-05-12 | Discharge: 2022-05-12 | Disposition: A | Discharge status: Home / Self Care | Payer: Medicare Other | Source: Ambulatory Visit | Attending: Family Medicine | Admitting: Family Medicine

## 2022-05-12 DIAGNOSIS — M47816 Spondylosis without myelopathy or radiculopathy, lumbar region: Secondary | ICD-10-CM | POA: Diagnosis not present

## 2022-05-12 DIAGNOSIS — M5432 Sciatica, left side: Secondary | ICD-10-CM

## 2022-05-12 DIAGNOSIS — M48061 Spinal stenosis, lumbar region without neurogenic claudication: Secondary | ICD-10-CM | POA: Diagnosis not present

## 2022-05-12 DIAGNOSIS — M4807 Spinal stenosis, lumbosacral region: Secondary | ICD-10-CM | POA: Diagnosis not present

## 2022-05-12 DIAGNOSIS — M545 Low back pain, unspecified: Secondary | ICD-10-CM | POA: Diagnosis not present

## 2022-05-17 ENCOUNTER — Other Ambulatory Visit: Payer: Self-pay

## 2022-05-17 ENCOUNTER — Encounter (HOSPITAL_COMMUNITY): Payer: Self-pay | Admitting: Emergency Medicine

## 2022-05-17 ENCOUNTER — Telehealth: Payer: Self-pay | Admitting: Interventional Cardiology

## 2022-05-17 ENCOUNTER — Inpatient Hospital Stay (HOSPITAL_COMMUNITY)
Admission: EM | Admit: 2022-05-17 | Discharge: 2022-05-24 | DRG: 698 | Disposition: A | Discharge status: Home / Self Care | Payer: Medicare Other | Attending: Family Medicine | Admitting: Family Medicine

## 2022-05-17 ENCOUNTER — Ambulatory Visit: Payer: Medicare Other | Admitting: Physical Therapy

## 2022-05-17 ENCOUNTER — Emergency Department (HOSPITAL_COMMUNITY): Payer: Medicare Other

## 2022-05-17 DIAGNOSIS — Z8673 Personal history of transient ischemic attack (TIA), and cerebral infarction without residual deficits: Secondary | ICD-10-CM

## 2022-05-17 DIAGNOSIS — Z833 Family history of diabetes mellitus: Secondary | ICD-10-CM | POA: Diagnosis not present

## 2022-05-17 DIAGNOSIS — D649 Anemia, unspecified: Secondary | ICD-10-CM | POA: Diagnosis not present

## 2022-05-17 DIAGNOSIS — Z79899 Other long term (current) drug therapy: Secondary | ICD-10-CM

## 2022-05-17 DIAGNOSIS — E782 Mixed hyperlipidemia: Secondary | ICD-10-CM | POA: Diagnosis not present

## 2022-05-17 DIAGNOSIS — N045 Nephrotic syndrome with diffuse mesangiocapillary glomerulonephritis: Secondary | ICD-10-CM

## 2022-05-17 DIAGNOSIS — Z1152 Encounter for screening for COVID-19: Secondary | ICD-10-CM

## 2022-05-17 DIAGNOSIS — E1121 Type 2 diabetes mellitus with diabetic nephropathy: Principal | ICD-10-CM | POA: Diagnosis present

## 2022-05-17 DIAGNOSIS — J069 Acute upper respiratory infection, unspecified: Secondary | ICD-10-CM | POA: Diagnosis present

## 2022-05-17 DIAGNOSIS — E039 Hypothyroidism, unspecified: Secondary | ICD-10-CM | POA: Diagnosis present

## 2022-05-17 DIAGNOSIS — N049 Nephrotic syndrome with unspecified morphologic changes: Secondary | ICD-10-CM

## 2022-05-17 DIAGNOSIS — E8809 Other disorders of plasma-protein metabolism, not elsewhere classified: Secondary | ICD-10-CM | POA: Diagnosis not present

## 2022-05-17 DIAGNOSIS — R7303 Prediabetes: Secondary | ICD-10-CM | POA: Diagnosis not present

## 2022-05-17 DIAGNOSIS — M25562 Pain in left knee: Secondary | ICD-10-CM | POA: Diagnosis not present

## 2022-05-17 DIAGNOSIS — I5023 Acute on chronic systolic (congestive) heart failure: Secondary | ICD-10-CM | POA: Diagnosis not present

## 2022-05-17 DIAGNOSIS — M48061 Spinal stenosis, lumbar region without neurogenic claudication: Secondary | ICD-10-CM | POA: Diagnosis not present

## 2022-05-17 DIAGNOSIS — R809 Proteinuria, unspecified: Secondary | ICD-10-CM | POA: Diagnosis not present

## 2022-05-17 DIAGNOSIS — I480 Paroxysmal atrial fibrillation: Secondary | ICD-10-CM | POA: Diagnosis present

## 2022-05-17 DIAGNOSIS — R52 Pain, unspecified: Secondary | ICD-10-CM | POA: Diagnosis not present

## 2022-05-17 DIAGNOSIS — M25561 Pain in right knee: Secondary | ICD-10-CM | POA: Diagnosis not present

## 2022-05-17 DIAGNOSIS — R319 Hematuria, unspecified: Secondary | ICD-10-CM | POA: Diagnosis present

## 2022-05-17 DIAGNOSIS — Z7901 Long term (current) use of anticoagulants: Secondary | ICD-10-CM

## 2022-05-17 DIAGNOSIS — I110 Hypertensive heart disease with heart failure: Secondary | ICD-10-CM | POA: Diagnosis not present

## 2022-05-17 DIAGNOSIS — E119 Type 2 diabetes mellitus without complications: Secondary | ICD-10-CM | POA: Diagnosis present

## 2022-05-17 DIAGNOSIS — I429 Cardiomyopathy, unspecified: Secondary | ICD-10-CM | POA: Diagnosis not present

## 2022-05-17 DIAGNOSIS — I10 Essential (primary) hypertension: Secondary | ICD-10-CM | POA: Diagnosis present

## 2022-05-17 DIAGNOSIS — Z87441 Personal history of nephrotic syndrome: Secondary | ICD-10-CM

## 2022-05-17 DIAGNOSIS — J90 Pleural effusion, not elsewhere classified: Secondary | ICD-10-CM | POA: Diagnosis not present

## 2022-05-17 DIAGNOSIS — R600 Localized edema: Secondary | ICD-10-CM | POA: Diagnosis not present

## 2022-05-17 DIAGNOSIS — I5020 Unspecified systolic (congestive) heart failure: Secondary | ICD-10-CM | POA: Diagnosis not present

## 2022-05-17 DIAGNOSIS — M7989 Other specified soft tissue disorders: Secondary | ICD-10-CM | POA: Diagnosis present

## 2022-05-17 DIAGNOSIS — Z0389 Encounter for observation for other suspected diseases and conditions ruled out: Secondary | ICD-10-CM | POA: Diagnosis not present

## 2022-05-17 DIAGNOSIS — Z79890 Hormone replacement therapy (postmenopausal): Secondary | ICD-10-CM | POA: Diagnosis not present

## 2022-05-17 DIAGNOSIS — R0609 Other forms of dyspnea: Secondary | ICD-10-CM | POA: Diagnosis not present

## 2022-05-17 DIAGNOSIS — R079 Chest pain, unspecified: Secondary | ICD-10-CM | POA: Diagnosis not present

## 2022-05-17 DIAGNOSIS — R059 Cough, unspecified: Secondary | ICD-10-CM | POA: Diagnosis not present

## 2022-05-17 LAB — COMPREHENSIVE METABOLIC PANEL
ALT: 36 U/L (ref 0–44)
AST: 43 U/L — ABNORMAL HIGH (ref 15–41)
Albumin: 1.7 g/dL — ABNORMAL LOW (ref 3.5–5.0)
Alkaline Phosphatase: 50 U/L (ref 38–126)
Anion gap: 7 (ref 5–15)
BUN: 23 mg/dL (ref 8–23)
CO2: 26 mmol/L (ref 22–32)
Calcium: 9 mg/dL (ref 8.9–10.3)
Chloride: 104 mmol/L (ref 98–111)
Creatinine, Ser: 0.83 mg/dL (ref 0.44–1.00)
GFR, Estimated: >60 mL/min (ref >60)
Glucose, Bld: 95 mg/dL (ref 70–99)
Potassium: 3.9 mmol/L (ref 3.5–5.1)
Sodium: 137 mmol/L (ref 135–145)
Total Bilirubin: 0.2 mg/dL — ABNORMAL LOW (ref 0.3–1.2)
Total Protein: 5.4 g/dL — ABNORMAL LOW (ref 6.5–8.1)

## 2022-05-17 LAB — CBC WITH DIFFERENTIAL/PLATELET
Abs Immature Granulocytes: 0.01 10*3/uL (ref 0.00–0.07)
Basophils Absolute: 0 10*3/uL (ref 0.0–0.1)
Basophils Relative: 1 %
Eosinophils Absolute: 0 10*3/uL (ref 0.0–0.5)
Eosinophils Relative: 1 %
HCT: 42.6 % (ref 36.0–46.0)
Hemoglobin: 13.8 g/dL (ref 12.0–15.0)
Immature Granulocytes: 0 %
Lymphocytes Relative: 36 %
Lymphs Abs: 2.6 10*3/uL (ref 0.7–4.0)
MCH: 30.6 pg (ref 26.0–34.0)
MCHC: 32.4 g/dL (ref 30.0–36.0)
MCV: 94.5 fL (ref 80.0–100.0)
Monocytes Absolute: 0.6 10*3/uL (ref 0.1–1.0)
Monocytes Relative: 8 %
Neutro Abs: 4.1 10*3/uL (ref 1.7–7.7)
Neutrophils Relative %: 54 %
Platelets: 199 10*3/uL (ref 150–400)
RBC: 4.51 MIL/uL (ref 3.87–5.11)
RDW: 12.7 % (ref 11.5–15.5)
WBC: 7.3 10*3/uL (ref 4.0–10.5)
nRBC: 0 % (ref 0.0–0.2)

## 2022-05-17 LAB — BRAIN NATRIURETIC PEPTIDE: B Natriuretic Peptide: 283.2 pg/mL — ABNORMAL HIGH (ref 0.0–100.0)

## 2022-05-17 NOTE — ED Provider Triage Note (Signed)
Emergency Medicine Provider Triage Evaluation Note  Rita Myers , a 77 y.o. female  was evaluated in triage.  Pt complains of lower extremity swelling.  Patient reports she has had about 2 months of bilateral lower leg edema.  Patient also reports 3 days of furosemide which was unable to resolve extent of leg swelling.  Denies any prior history of known congestive heart failure.  Denies shortness of breath, chest pain, abdominal pain, nausea, vomiting, diarrhea..  Review of Systems  Positive: As above Negative: As above  Physical Exam  BP (!) 187/69 (BP Location: Right Arm)   Pulse 88   Temp 98.4 F (36.9 C) (Oral)   Resp 16   SpO2 100%  Gen:   Awake, no distress   Resp:  Normal effort  MSK:   Moves extremities without difficulty  Other:  Bilateral 2+ pitting edema extending from dorsal aspect of bilateral feet up through knees  Medical Decision Making  Medically screening exam initiated at 7:22 PM.  Appropriate orders placed.  Marveline Epstein was informed that the remainder of the evaluation will be completed by another provider, this initial triage assessment does not replace that evaluation, and the importance of remaining in the ED until their evaluation is complete.     Luvenia Heller, PA-C 05/17/22 1923

## 2022-05-17 NOTE — ED Triage Notes (Signed)
Pt reports bilateral leg swelling x 2 months. PT sent from orthopedic Dr for echo. Pt reports leg pain. Denies SHOB.

## 2022-05-17 NOTE — Telephone Encounter (Signed)
Daughter calling in to have a echo done for patient. Please advise

## 2022-05-17 NOTE — Telephone Encounter (Signed)
Received a call from Dr. Lauris Poag office Cloyde Reams, his medical assistant) stating the patient was seen there this afternoon and felt that her leg pains seemed to be related to a cardiac etiology as she has 3-4 + pitting edema, L > R and painful with deep pressure.  He noted the swelling was not improved despite diuretics.  She said he asked her to call and have the patient seen today or tomorrow.  Patient's daughter was at the office and the appointment information was communicated to her by [facility] - Friday 05/19/21 with APP.      I called the patient's daughter back.  She states that Dr. Brien Few wants the patient to have an echo and if someone can give an order she can go into urgent care or the ER and get one.    She is at the [facility] parking lot now.  We discussed that an evaluation would be beneficial at this time and then a decision for echo would follow. She stated that patient is SOB somewhat and that the meds given to her to help the fluid have not helped her.   Adv to take her mom into the ER if she is not comfortable waiting until Friday for the appointment and she stated then that Dr. Brien Few advised she be seen in the ER.   Kept appt for Friday at this time.  Would still like appointment tomorrow if any availability opens.

## 2022-05-18 ENCOUNTER — Emergency Department (HOSPITAL_COMMUNITY): Payer: Medicare Other

## 2022-05-18 ENCOUNTER — Emergency Department (HOSPITAL_COMMUNITY)
Admit: 2022-05-18 | Discharge: 2022-05-18 | Disposition: A | Discharge status: Home / Self Care | Payer: Medicare Other | Attending: Emergency Medicine | Admitting: Emergency Medicine

## 2022-05-18 DIAGNOSIS — J069 Acute upper respiratory infection, unspecified: Secondary | ICD-10-CM | POA: Diagnosis present

## 2022-05-18 DIAGNOSIS — I10 Essential (primary) hypertension: Secondary | ICD-10-CM

## 2022-05-18 DIAGNOSIS — M25561 Pain in right knee: Secondary | ICD-10-CM | POA: Diagnosis present

## 2022-05-18 DIAGNOSIS — M7989 Other specified soft tissue disorders: Secondary | ICD-10-CM | POA: Diagnosis present

## 2022-05-18 DIAGNOSIS — Z7901 Long term (current) use of anticoagulants: Secondary | ICD-10-CM | POA: Diagnosis not present

## 2022-05-18 DIAGNOSIS — R7303 Prediabetes: Secondary | ICD-10-CM

## 2022-05-18 DIAGNOSIS — E039 Hypothyroidism, unspecified: Secondary | ICD-10-CM

## 2022-05-18 DIAGNOSIS — Z79899 Other long term (current) drug therapy: Secondary | ICD-10-CM | POA: Diagnosis not present

## 2022-05-18 DIAGNOSIS — Z87441 Personal history of nephrotic syndrome: Secondary | ICD-10-CM | POA: Diagnosis not present

## 2022-05-18 DIAGNOSIS — I5020 Unspecified systolic (congestive) heart failure: Secondary | ICD-10-CM | POA: Diagnosis present

## 2022-05-18 DIAGNOSIS — M25562 Pain in left knee: Secondary | ICD-10-CM | POA: Diagnosis present

## 2022-05-18 DIAGNOSIS — R809 Proteinuria, unspecified: Secondary | ICD-10-CM | POA: Diagnosis present

## 2022-05-18 DIAGNOSIS — R0609 Other forms of dyspnea: Secondary | ICD-10-CM

## 2022-05-18 DIAGNOSIS — Z8673 Personal history of transient ischemic attack (TIA), and cerebral infarction without residual deficits: Secondary | ICD-10-CM | POA: Diagnosis not present

## 2022-05-18 DIAGNOSIS — E782 Mixed hyperlipidemia: Secondary | ICD-10-CM | POA: Diagnosis present

## 2022-05-18 DIAGNOSIS — I480 Paroxysmal atrial fibrillation: Secondary | ICD-10-CM

## 2022-05-18 DIAGNOSIS — R52 Pain, unspecified: Secondary | ICD-10-CM

## 2022-05-18 DIAGNOSIS — E1121 Type 2 diabetes mellitus with diabetic nephropathy: Secondary | ICD-10-CM | POA: Diagnosis present

## 2022-05-18 DIAGNOSIS — E8809 Other disorders of plasma-protein metabolism, not elsewhere classified: Secondary | ICD-10-CM | POA: Diagnosis present

## 2022-05-18 DIAGNOSIS — Z1152 Encounter for screening for COVID-19: Secondary | ICD-10-CM | POA: Diagnosis not present

## 2022-05-18 DIAGNOSIS — D649 Anemia, unspecified: Secondary | ICD-10-CM | POA: Diagnosis not present

## 2022-05-18 DIAGNOSIS — I110 Hypertensive heart disease with heart failure: Secondary | ICD-10-CM | POA: Diagnosis present

## 2022-05-18 DIAGNOSIS — Z79890 Hormone replacement therapy (postmenopausal): Secondary | ICD-10-CM | POA: Diagnosis not present

## 2022-05-18 DIAGNOSIS — R319 Hematuria, unspecified: Secondary | ICD-10-CM | POA: Diagnosis present

## 2022-05-18 DIAGNOSIS — N049 Nephrotic syndrome with unspecified morphologic changes: Secondary | ICD-10-CM | POA: Diagnosis not present

## 2022-05-18 DIAGNOSIS — I429 Cardiomyopathy, unspecified: Secondary | ICD-10-CM | POA: Diagnosis present

## 2022-05-18 DIAGNOSIS — R600 Localized edema: Secondary | ICD-10-CM | POA: Diagnosis not present

## 2022-05-18 DIAGNOSIS — N045 Nephrotic syndrome with diffuse mesangiocapillary glomerulonephritis: Secondary | ICD-10-CM

## 2022-05-18 DIAGNOSIS — Z833 Family history of diabetes mellitus: Secondary | ICD-10-CM | POA: Diagnosis not present

## 2022-05-18 DIAGNOSIS — I5023 Acute on chronic systolic (congestive) heart failure: Secondary | ICD-10-CM | POA: Diagnosis present

## 2022-05-18 LAB — ECHOCARDIOGRAM COMPLETE
AR max vel: 1.87 cm2
AV Area VTI: 1.85 cm2
AV Area mean vel: 1.9 cm2
AV Mean grad: 5 mmHg
AV Peak grad: 9.3 mmHg
Ao pk vel: 1.53 m/s
Area-P 1/2: 3.57 cm2
MV M vel: 6.59 m/s
MV Peak grad: 173.7 mmHg
P 1/2 time: 660 msec
S' Lateral: 4.1 cm

## 2022-05-18 LAB — URINALYSIS, ROUTINE W REFLEX MICROSCOPIC
Bilirubin Urine: NEGATIVE
Glucose, UA: NEGATIVE mg/dL
Ketones, ur: NEGATIVE mg/dL
Leukocytes,Ua: NEGATIVE
Nitrite: NEGATIVE
Protein, ur: >=300 mg/dL — AB
Specific Gravity, Urine: 1.016 (ref 1.005–1.030)
pH: 6 (ref 5.0–8.0)

## 2022-05-18 LAB — PROTEIN, URINE, RANDOM: Total Protein, Urine: 1165 mg/dL

## 2022-05-18 LAB — TROPONIN I (HIGH SENSITIVITY)
Troponin I (High Sensitivity): 12 ng/L (ref <18)
Troponin I (High Sensitivity): 14 ng/L (ref <18)

## 2022-05-18 LAB — PREALBUMIN: Prealbumin: 17 mg/dL — ABNORMAL LOW (ref 18–38)

## 2022-05-18 LAB — CREATININE, URINE, RANDOM: Creatinine, Urine: 61 mg/dL

## 2022-05-18 LAB — TSH: TSH: 5.415 u[IU]/mL — ABNORMAL HIGH (ref 0.350–4.500)

## 2022-05-18 MED ORDER — FUROSEMIDE 10 MG/ML IJ SOLN
40.0000 mg | Freq: Two times a day (BID) | INTRAMUSCULAR | Status: DC
  Administered 2022-05-18 – 2022-05-19 (×2): 40 mg via INTRAVENOUS
  Filled 2022-05-18 (×2): qty 4

## 2022-05-18 MED ORDER — SODIUM CHLORIDE 0.9% FLUSH
3.0000 mL | Freq: Two times a day (BID) | INTRAVENOUS | Status: DC
  Administered 2022-05-18 – 2022-05-24 (×13): 3 mL via INTRAVENOUS

## 2022-05-18 MED ORDER — LEVOTHYROXINE SODIUM 25 MCG PO TABS: 25.0000 ug | ORAL_TABLET | Freq: Every morning | ORAL | Status: DC

## 2022-05-18 MED ORDER — POTASSIUM CHLORIDE CRYS ER 20 MEQ PO TBCR
40.0000 meq | EXTENDED_RELEASE_TABLET | Freq: Once | ORAL | Status: AC
  Administered 2022-05-18: 40 meq via ORAL
  Filled 2022-05-18: qty 2

## 2022-05-18 MED ORDER — LEVOTHYROXINE SODIUM 75 MCG PO TABS
37.5000 ug | ORAL_TABLET | Freq: Every morning | ORAL | Status: DC
  Administered 2022-05-19 – 2022-05-24 (×5): 37.5 ug via ORAL
  Filled 2022-05-18 (×6): qty 1

## 2022-05-18 MED ORDER — ONDANSETRON HCL 4 MG PO TABS: 4.0000 mg | ORAL_TABLET | Freq: Four times a day (QID) | ORAL | Status: DC | PRN

## 2022-05-18 MED ORDER — GABAPENTIN 300 MG PO CAPS
300.0000 mg | ORAL_CAPSULE | Freq: Two times a day (BID) | ORAL | Status: DC
  Administered 2022-05-18 – 2022-05-24 (×12): 300 mg via ORAL
  Filled 2022-05-18 (×12): qty 1

## 2022-05-18 MED ORDER — PRAVASTATIN SODIUM 40 MG PO TABS
20.0000 mg | ORAL_TABLET | Freq: Every day | ORAL | Status: DC
  Administered 2022-05-18 – 2022-05-19 (×2): 20 mg via ORAL
  Filled 2022-05-18: qty 2
  Filled 2022-05-18: qty 1

## 2022-05-18 MED ORDER — LEVOTHYROXINE SODIUM 75 MCG PO TABS: 37.5000 ug | ORAL_TABLET | Freq: Every morning | ORAL | Status: DC

## 2022-05-18 MED ORDER — LOSARTAN POTASSIUM 50 MG PO TABS
50.0000 mg | ORAL_TABLET | Freq: Every day | ORAL | Status: DC
  Administered 2022-05-18 – 2022-05-24 (×7): 50 mg via ORAL
  Filled 2022-05-18 (×7): qty 1

## 2022-05-18 MED ORDER — ONDANSETRON HCL 4 MG/2ML IJ SOLN: 4.0000 mg | Freq: Four times a day (QID) | INTRAMUSCULAR | Status: DC | PRN

## 2022-05-18 MED ORDER — DILTIAZEM HCL ER COATED BEADS 240 MG PO CP24
240.0000 mg | ORAL_CAPSULE | Freq: Every day | ORAL | Status: DC
  Administered 2022-05-18: 240 mg via ORAL
  Filled 2022-05-18: qty 1

## 2022-05-18 MED ORDER — ACETAMINOPHEN 650 MG RE SUPP: 650.0000 mg | Freq: Four times a day (QID) | RECTAL | Status: DC | PRN

## 2022-05-18 MED ORDER — ACETAMINOPHEN 325 MG PO TABS
650.0000 mg | ORAL_TABLET | Freq: Four times a day (QID) | ORAL | Status: DC | PRN
  Administered 2022-05-18: 650 mg via ORAL
  Filled 2022-05-18: qty 2

## 2022-05-18 MED ORDER — FUROSEMIDE 10 MG/ML IJ SOLN
40.0000 mg | Freq: Once | INTRAMUSCULAR | Status: AC
  Administered 2022-05-18: 40 mg via INTRAVENOUS
  Filled 2022-05-18: qty 4

## 2022-05-18 MED ORDER — APIXABAN 5 MG PO TABS
5.0000 mg | ORAL_TABLET | Freq: Two times a day (BID) | ORAL | Status: DC
  Administered 2022-05-18 – 2022-05-20 (×5): 5 mg via ORAL
  Filled 2022-05-18 (×6): qty 1

## 2022-05-18 MED ORDER — LEVOTHYROXINE SODIUM 25 MCG PO TABS
25.0000 ug | ORAL_TABLET | Freq: Every morning | ORAL | Status: DC
  Administered 2022-05-18: 25 ug via ORAL
  Filled 2022-05-18: qty 1

## 2022-05-18 NOTE — Progress Notes (Signed)
Bilateral lower extremity venous duplex has been completed. Preliminary results can be found in CV Proc through chart review.  Results were given to Dr. Wyvonnia Dusky.   05/18/22 1:46 PM Rita Myers RVT

## 2022-05-18 NOTE — ED Notes (Signed)
Pt transported to US

## 2022-05-18 NOTE — ED Notes (Signed)
ED TO INPATIENT HANDOFF REPORT  ED Nurse Name and Phone #: Deigo Alonso RN [phone]  S Name/Age/Gender Rita Myers 77 y.o. female Room/Bed: 045C/045C  Code Status   Code Status: Full Code  Home/SNF/Other Home Patient oriented to: self, place, time, and situation Is this baseline? Yes   Triage Complete: Triage complete  Chief Complaint Swelling of both lower extremities [M79.89]  Triage Note Pt reports bilateral leg swelling x 2 months. PT sent from orthopedic Dr for echo. Pt reports leg pain. Denies SHOB.    Allergies No Known Allergies  Level of Care/Admitting Diagnosis ED Disposition     ED Disposition  Admit   Condition  --   Comment  Hospital Area: [facility] [[id number]]  Level of Care: Telemetry Cardiac [103]  May admit patient to [facility] or [facility] if equivalent level of care is available:: No  Covid Evaluation: Asymptomatic - no recent exposure (last 10 days) testing not required  Diagnosis: Swelling of both lower extremities [0175102]  Admitting Physician: Norval Morton [[id number]]  Attending Physician: Norval Morton [[id number]]  Certification:: I certify this patient will need inpatient services for at least 2 midnights  Estimated Length of Stay: 2          B Medical/Surgery History Past Medical History:  Diagnosis Date   Aortic insufficiency    Essential hypertension 07/11/2017   Hyperlipemia    Hypertension    Hypokalemia 07/11/2017   Mitral regurgitation    Nausea, vomiting, and diarrhea 07/11/2017   Paroxysmal A-fib ([place]) 07/11/2017   Pulmonary hypertension ([place])    Syncope, vasovagal 07/11/2017   History reviewed. No pertinent surgical history.   A IV Location/Drains/Wounds Patient Lines/Drains/Airways Status     Active Line/Drains/Airways     Name Placement date Placement time Site Days   Peripheral IV 05/18/22 20 G Right Antecubital 05/18/22  --  Antecubital  less than 1            Intake/Output Last 24  hours  Intake/Output Summary (Last 24 hours) at 05/18/2022 1920 Last data filed at 05/18/2022 1613 Gross per 24 hour  Intake 3 ml  Output 400 ml  Net -397 ml    Labs/Imaging Results for orders placed or performed during the hospital encounter of 05/17/22 (from the past 48 hour(s))  Brain natriuretic peptide     Status: Abnormal   Collection Time: 05/17/22  7:27 PM  Result Value Ref Range   B Natriuretic Peptide 283.2 (H) 0.0 - 100.0 pg/mL    Comment: Performed at [facility], [address], [place], [place] [address]  Comprehensive metabolic panel     Status: Abnormal   Collection Time: 05/17/22  7:27 PM  Result Value Ref Range   Sodium 137 135 - 145 mmol/L   Potassium 3.9 3.5 - 5.1 mmol/L   Chloride 104 98 - 111 mmol/L   CO2 26 22 - 32 mmol/L   Glucose, Bld 95 70 - 99 mg/dL    Comment: Glucose reference range applies only to samples taken after fasting for at least 8 hours.   BUN 23 8 - 23 mg/dL   Creatinine, Ser 0.83 0.44 - 1.00 mg/dL   Calcium 9.0 8.9 - 10.3 mg/dL   Total Protein 5.4 (L) 6.5 - 8.1 g/dL   Albumin 1.7 (L) 3.5 - 5.0 g/dL   AST 43 (H) 15 - 41 U/L   ALT 36 0 - 44 U/L   Alkaline Phosphatase 50 38 - 126  U/L   Total Bilirubin 0.2 (L) 0.3 - 1.2 mg/dL   GFR, Estimated >81 >19 mL/min    Comment: (NOTE) Calculated using the CKD-EPI Creatinine Equation (2021)    Anion gap 7 5 - 15    Comment: Performed at [facility], [address], [place], [place] [address]  CBC with Differential     Status: None   Collection Time: 05/17/22  7:27 PM  Result Value Ref Range   WBC 7.3 4.0 - 10.5 K/uL   RBC 4.51 3.87 - 5.11 MIL/uL   Hemoglobin 13.8 12.0 - 15.0 g/dL   HCT 95.6 21.3 - 08.6 %   MCV 94.5 80.0 - 100.0 fL   MCH 30.6 26.0 - 34.0 pg   MCHC 32.4 30.0 - 36.0 g/dL   RDW 57.8 46.9 - 62.9 %   Platelets 199 150 - 400 K/uL   nRBC 0.0 0.0 - 0.2 %   Neutrophils Relative % 54 %   Neutro Abs 4.1 1.7 - 7.7 K/uL   Lymphocytes Relative 36 %   Lymphs Abs 2.6 0.7 -  4.0 K/uL   Monocytes Relative 8 %   Monocytes Absolute 0.6 0.1 - 1.0 K/uL   Eosinophils Relative 1 %   Eosinophils Absolute 0.0 0.0 - 0.5 K/uL   Basophils Relative 1 %   Basophils Absolute 0.0 0.0 - 0.1 K/uL   Immature Granulocytes 0 %   Abs Immature Granulocytes 0.01 0.00 - 0.07 K/uL    Comment: Performed at [facility], [address], [place], [place] [address]  Troponin I (High Sensitivity)     Status: None   Collection Time: 05/18/22 11:34 AM  Result Value Ref Range   Troponin I (High Sensitivity) 12 <18 ng/L    Comment: (NOTE) Elevated high sensitivity troponin I (hsTnI) values and significant  changes across serial measurements may suggest ACS but many other  chronic and acute conditions are known to elevate hsTnI results.  Refer to the "Links" section for chest pain algorithms and additional  guidance. Performed at [facility], [address] [place], [place] [address]   TSH     Status: Abnormal   Collection Time: 05/18/22  1:15 PM  Result Value Ref Range   TSH 5.415 (H) 0.350 - 4.500 uIU/mL    Comment: Performed by a 3rd Generation assay with a functional sensitivity of <=0.01 uIU/mL. Performed at [facility], [address] [place], [place] [address]   Troponin I (High Sensitivity)     Status: None   Collection Time: 05/18/22  2:06 PM  Result Value Ref Range   Troponin I (High Sensitivity) 14 <18 ng/L    Comment: (NOTE) Elevated high sensitivity troponin I (hsTnI) values and significant  changes across serial measurements may suggest ACS but many other  chronic and acute conditions are known to elevate hsTnI results.  Refer to the "Links" section for chest pain algorithms and additional  guidance. Performed at [facility], [address] [place], [place] [address]   Urinalysis, Routine w reflex microscopic Urine, Clean Catch     Status: Abnormal   Collection Time: 05/18/22  2:06 PM  Result Value Ref Range   Color, Urine YELLOW  YELLOW   APPearance HAZY (A) CLEAR   Specific Gravity, Urine 1.016 1.005 - 1.030   pH 6.0 5.0 - 8.0   Glucose, UA NEGATIVE NEGATIVE mg/dL   Hgb urine dipstick SMALL (A) NEGATIVE   Bilirubin Urine NEGATIVE  NEGATIVE   Ketones, ur NEGATIVE NEGATIVE mg/dL   Protein, ur >=712 (A) NEGATIVE mg/dL   Nitrite NEGATIVE NEGATIVE   Leukocytes,Ua NEGATIVE NEGATIVE   RBC / HPF 11-20 0 - 5 RBC/hpf   WBC, UA 0-5 0 - 5 WBC/hpf   Bacteria, UA FEW (A) NONE SEEN   Squamous Epithelial / HPF 0-5 0 - 5 /HPF   Mucus PRESENT    Hyaline Casts, UA PRESENT     Comment: Performed at [facility], [address], [place], [place] [address]  Prealbumin     Status: Abnormal   Collection Time: 05/18/22  2:06 PM  Result Value Ref Range   Prealbumin 17 (L) 18 - 38 mg/dL    Comment: Performed at [facility], [address], [place], [place] [address]   ECHOCARDIOGRAM COMPLETE  Result Date: 05/18/2022    ECHOCARDIOGRAM REPORT   Patient Name:   Rita Myers Date of Exam: 05/18/2022 Medical Rec #:  [id number]   Height:       52.0 in Accession #:    [id number]  Weight:       141.0 lb Date of Birth:  29-Jan-1946  BSA:          1.451 m Patient Age:    76 years    BP:           177/79 mmHg Patient Gender: F           HR:           93 bpm. Exam Location:  Inpatient Procedure: 2D Echo, Cardiac Doppler and Color Doppler Indications:    Dyspnea R06.00  History:        Patient has prior history of Echocardiogram examinations, most                 recent 11/22/2020. Arrythmias:Atrial Fibrillation,                 Signs/Symptoms:Syncope; Risk Factors:Hypertension, Diabetes and                 Dyslipidemia.  Sonographer:    Lucendia Herrlich Referring Phys: [phone] STEPHEN RANCOUR IMPRESSIONS  1. Left ventricular ejection fraction, by estimation, is 40 to 45%. The left ventricle has mildly decreased function. The left ventricle demonstrates global hypokinesis. Left ventricular diastolic parameters are consistent with Grade II diastolic  dysfunction (pseudonormalization). Elevated left atrial pressure.  2. Right ventricular systolic function is normal. The right ventricular size is normal. There is mildly elevated pulmonary artery systolic pressure. The estimated right ventricular systolic pressure is 43.4 mmHg.  3. Left atrial size was mildly dilated.  4. A small pericardial effusion is present.  5. The mitral valve is abnormal. Mild mitral valve regurgitation. Moderate mitral annular calcification.  6. The aortic valve is tricuspid. Aortic valve regurgitation is trivial. Aortic valve sclerosis is present, with no evidence of aortic valve stenosis.  7. The inferior vena cava is normal in size with greater than 50% respiratory variability, suggesting right atrial pressure of 3 mmHg. FINDINGS  Left Ventricle: Left ventricular ejection fraction, by estimation, is 40 to 45%. The left ventricle has mildly decreased function. The left ventricle demonstrates global hypokinesis. The left ventricular internal cavity size was normal in size. There is  no left ventricular hypertrophy. Left ventricular diastolic parameters are consistent with Grade II diastolic dysfunction (pseudonormalization). Elevated left atrial pressure. Right Ventricle: The right ventricular size is normal. No increase in right ventricular wall thickness. Right ventricular systolic function  is normal. There is mildly elevated pulmonary artery systolic pressure. The tricuspid regurgitant velocity is 3.18  m/s, and with an assumed right atrial pressure of 3 mmHg, the estimated right ventricular systolic pressure is 43.4 mmHg. Left Atrium: Left atrial size was mildly dilated. Right Atrium: Right atrial size was normal in size. Pericardium: A small pericardial effusion is present. Mitral Valve: The mitral valve is abnormal. Moderate mitral annular calcification. Mild mitral valve regurgitation. Tricuspid Valve: The tricuspid valve is normal in structure. Tricuspid valve regurgitation is  mild. Aortic Valve: The aortic valve is tricuspid. Aortic valve regurgitation is trivial. Aortic regurgitation PHT measures 660 msec. Aortic valve sclerosis is present, with no evidence of aortic valve stenosis. Aortic valve mean gradient measures 5.0 mmHg. Aortic valve peak gradient measures 9.3 mmHg. Aortic valve area, by VTI measures 1.85 cm. Pulmonic Valve: The pulmonic valve was grossly normal. Pulmonic valve regurgitation is trivial. Aorta: The aortic root and ascending aorta are structurally normal, with no evidence of dilitation. Venous: The inferior vena cava is normal in size with greater than 50% respiratory variability, suggesting right atrial pressure of 3 mmHg. IAS/Shunts: The interatrial septum was not well visualized.  LEFT VENTRICLE PLAX 2D LVIDd:         5.20 cm   Diastology LVIDs:         4.10 cm   LV e' medial:    4.13 cm/s LV PW:         0.90 cm   LV E/e' medial:  23.9 LV IVS:        0.90 cm   LV e' lateral:   4.90 cm/s LVOT diam:     1.90 cm   LV E/e' lateral: 20.1 LV SV:         53 LV SV Index:   37 LVOT Area:     2.84 cm  RIGHT VENTRICLE             IVC RV S prime:     18.10 cm/s  IVC diam: 1.20 cm TAPSE (M-mode): 3.0 cm LEFT ATRIUM           Index        RIGHT ATRIUM           Index LA diam:      4.30 cm 2.96 cm/m   RA Area:     13.20 cm LA Vol (A2C): 31.1 ml 21.44 ml/m  RA Volume:   28.30 ml  19.51 ml/m LA Vol (A4C): 73.6 ml 50.74 ml/m  AORTIC VALVE AV Area (Vmax):    1.87 cm AV Area (Vmean):   1.90 cm AV Area (VTI):     1.85 cm AV Vmax:           152.50 cm/s AV Vmean:          105.350 cm/s AV VTI:            0.286 m AV Peak Grad:      9.3 mmHg AV Mean Grad:      5.0 mmHg LVOT Vmax:         100.80 cm/s LVOT Vmean:        70.567 cm/s LVOT VTI:          0.187 m LVOT/AV VTI ratio: 0.65 AI PHT:            660 msec  AORTA Ao Root diam: 2.70 cm Ao Asc diam:  2.30 cm MITRAL VALVE  TRICUSPID VALVE MV Area (PHT): 3.57 cm     TR Peak grad:   40.4 mmHg MV Decel Time: 213 msec      TR Vmax:        318.00 cm/s MR Peak grad: 173.7 mmHg MR Mean grad: 124.0 mmHg    SHUNTS MR Vmax:      659.00 cm/s   Systemic VTI:  0.19 m MR Vmean:     537.0 cm/s    Systemic Diam: 1.90 cm MV E velocity: 98.65 cm/s MV A velocity: 129.50 cm/s MV E/A ratio:  0.76 Oswaldo Milian MD Electronically signed by Oswaldo Milian MD Signature Date/Time: 05/18/2022/3:51:14 PM    Final    VAS [place] LOWER EXTREMITY VENOUS (DVT) (ONLY [facility] & [facility])  Result Date: 05/18/2022  Lower Venous DVT Study Patient Name:  VERNEAL WIERS  Date of Exam:   05/18/2022 Medical Rec #: [id number]    Accession #:    [id number] Date of Birth: 16-Jun-1945   Patient Gender: F Patient Age:   13 years Exam Location:  [facility] Procedure:      VAS [place] LOWER EXTREMITY VENOUS (DVT) Referring Phys: Ezequiel Essex --------------------------------------------------------------------------------  Indications: Pain.  Risk Factors: None identified. Limitations: Body habitus, poor ultrasound/tissue interface and patient pain tolerance. Comparison Study: 04/18/2022 - BILATERAL:                   - No evidence of deep vein thrombosis seen in the lower                   extremities,                   bilaterally.                   -                   RIGHT:                   - A cystic structure found in the popliteal fossa.                   - 2.2 x .6 x 2.6 cm fluid collection seen in posterior knee                   fossa.                   Moderate suprapatellar fluid seen and superficial edema noted                   in calf.                    LEFT:                   - Acomplex cystic structure is found in the popliteal fossa.                   - 6.6 x 4.8 x 5.2 cm complex appearing Baker's cyst. Moderate                   to severe                   suprapatellar fluid noted with superficial edema in the calf. Performing Technologist: Oliver Hum RVT  Examination Guidelines: A complete evaluation includes B-mode imaging, spectral Doppler, color  Doppler, and power Doppler as  needed of all accessible portions of each vessel. Bilateral testing is considered an integral part of a complete examination. Limited examinations for reoccurring indications may be performed as noted. The reflux portion of the exam is performed with the patient in reverse Trendelenburg.  +---------+---------------+---------+-----------+----------+--------------+ RIGHT    CompressibilityPhasicitySpontaneityPropertiesThrombus Aging +---------+---------------+---------+-----------+----------+--------------+ CFV      Full           Yes      Yes                                 +---------+---------------+---------+-----------+----------+--------------+ SFJ      Full                                                        +---------+---------------+---------+-----------+----------+--------------+ FV Prox  Full                                                        +---------+---------------+---------+-----------+----------+--------------+ FV Mid   Full                                                        +---------+---------------+---------+-----------+----------+--------------+ FV DistalFull                                                        +---------+---------------+---------+-----------+----------+--------------+ PFV      Full                                                        +---------+---------------+---------+-----------+----------+--------------+ POP      Full           Yes      Yes                                 +---------+---------------+---------+-----------+----------+--------------+ PTV      Full                                                        +---------+---------------+---------+-----------+----------+--------------+ PERO     Full                                                        +---------+---------------+---------+-----------+----------+--------------+    +---------+---------------+---------+-----------+----------+--------------+  LEFT     CompressibilityPhasicitySpontaneityPropertiesThrombus Aging +---------+---------------+---------+-----------+----------+--------------+ CFV      Full           Yes      Yes                                 +---------+---------------+---------+-----------+----------+--------------+ SFJ      Full                                                        +---------+---------------+---------+-----------+----------+--------------+ FV Prox  Full                                                        +---------+---------------+---------+-----------+----------+--------------+ FV Mid                  Yes      Yes                                 +---------+---------------+---------+-----------+----------+--------------+ FV Distal               Yes      Yes                                 +---------+---------------+---------+-----------+----------+--------------+ PFV      Full                                                        +---------+---------------+---------+-----------+----------+--------------+ POP      Full           Yes      Yes                                 +---------+---------------+---------+-----------+----------+--------------+ PTV      Full                                                        +---------+---------------+---------+-----------+----------+--------------+ PERO     Full                                                        +---------+---------------+---------+-----------+----------+--------------+     Summary: RIGHT: - There is no evidence of deep vein thrombosis in the lower extremity. However, portions of this examination were limited- see technologist comments above.  - No cystic structure found in the popliteal fossa.  LEFT: - There is no evidence of deep vein thrombosis in  the lower extremity. However, portions of this examination were  limited- see technologist comments above.  - No cystic structure found in the popliteal fossa.  *See table(s) above for measurements and observations. Electronically signed by Sherald Hess MD on 05/18/2022 at 3:30:20 PM.    Final    DG Chest 2 View  Result Date: 05/17/2022 CLINICAL DATA:  Edema in the lower extremities EXAM: CHEST - 2 VIEW COMPARISON:  07/11/2017 FINDINGS: Transverse diameter of heart is increased. Pulmonary vascularity is unremarkable. There are no signs of pulmonary edema or focal pulmonary consolidation. There is blunting of both lateral CP angles. There is no pneumothorax. IMPRESSION: Cardiomegaly. There are no signs of pulmonary edema or focal pulmonary consolidation. Small bilateral pleural effusions. Electronically Signed   By: Ernie Avena M.D.   On: 05/17/2022 19:53    Pending Labs Unresulted Labs (From admission, onward)     Start     Ordered   05/19/22 0500  CBC  Tomorrow morning,   R        05/18/22 1348   05/19/22 0500  Basic metabolic panel  Tomorrow morning,   R        05/18/22 1348   05/19/22 0500  Magnesium  Tomorrow morning,   R        05/18/22 1816   05/19/22 0500  Lipid panel  Tomorrow morning,   R        05/18/22 1829   05/19/22 0500  Hemoglobin A1c  Tomorrow morning,   R        05/18/22 1829   05/19/22 0500  T4, free  Tomorrow morning,   R        05/18/22 1832   05/18/22 1338  Protein, urine, random  Once,   URGENT        05/18/22 1337   05/18/22 1338  Creatinine, urine, random  Once,   URGENT        05/18/22 1337   05/18/22 1308  Protein, urine, 24 hour  Once,   URGENT        05/18/22 1307            Vitals/Pain Today's Vitals   05/18/22 1640 05/18/22 1700 05/18/22 1800 05/18/22 1830  BP:  134/68 (!) 144/72 135/77  Pulse:  88 89 84  Resp:  17 18 18   Temp:      TempSrc:      SpO2:  100% 99% 100%  PainSc: 2        Isolation Precautions No active isolations  Medications Medications  losartan (COZAAR) tablet 50 mg (50 mg  Oral Given 05/18/22 1217)  diltiazem (CARDIZEM CD) 24 hr capsule 240 mg (240 mg Oral Given 05/18/22 1610)  pravastatin (PRAVACHOL) tablet 20 mg (20 mg Oral Given 05/18/22 1610)  apixaban (ELIQUIS) tablet 5 mg (5 mg Oral Given 05/18/22 1610)  gabapentin (NEURONTIN) capsule 300 mg (has no administration in time range)  sodium chloride flush (NS) 0.9 % injection 3 mL (3 mLs Intravenous Given 05/18/22 1613)  acetaminophen (TYLENOL) tablet 650 mg (650 mg Oral Given 05/18/22 1610)    Or  acetaminophen (TYLENOL) suppository 650 mg ( Rectal See Alternative 05/18/22 1610)  ondansetron (ZOFRAN) tablet 4 mg (has no administration in time range)    Or  ondansetron (ZOFRAN) injection 4 mg (has no administration in time range)  furosemide (LASIX) injection 40 mg (40 mg Intravenous Given 05/18/22 1721)  levothyroxine (SYNTHROID) tablet 37.5 mcg (has no administration in time range)  furosemide (LASIX)  injection 40 mg (40 mg Intravenous Given 05/18/22 1218)  potassium chloride SA (KLOR-CON M) CR tablet 40 mEq (40 mEq Oral Given 05/18/22 1851)    Mobility walks Moderate fall risk   Focused Assessments Cardiac Assessment Handoff:    Lab Results  Component Value Date   TROPONINI 0.03 (HH) 07/11/2017   Lab Results  Component Value Date   DDIMER 0.72 (H) 04/18/2022   Does the Patient currently have chest pain? No   , Pulmonary Assessment Handoff:  Lung sounds:   O2 Device: Room Air      R Recommendations: See Admitting Provider Note  Report given to:   Additional Notes:

## 2022-05-18 NOTE — H&P (Addendum)
History and Physical    Patient: Rita Myers SAY:[id number] DOB: Jun 13, 1945 DOA: 05/17/2022 DOS: the patient was seen and examined on 05/18/2022 PCP: Darrow Bussing, MD  Patient coming from: From orthopedic office Chief Complaint:  Chief Complaint  Patient presents with   Leg Swelling   HPI: Rita Myers is a 77 y.o. female with medical history significant of atrial fibrillation on Eliquis, hypertension, hyperlipidemia, mitral who presented with complaints of 1 month of leg swelling.  History is obtained from the patient with assistance of her daughter present at bedside.  She has been having pains in her legs the left leg more than the right.  She ambulates with use of a walker.  Associated symptoms include shortness of breath with exertion and gained approximately 12 pounds over the last last month.  She denies any history of heart failure to her knowledge and has had no complaints of chest pain.  She had been given Lasix to take for couple days, but it did not seem to help improve her symptoms. Patient noted to have negative Doppler ultrasound of the lower extremities on  12/5. 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  Her leg pain symptoms were not thought secondary to her back in he advised her to come to the hospital for further workup and echocardiogram as he believed symptoms were possibly related to her heart.  Patient has not had any significant fever, chills, nausea, vomiting, diarrhea, or dysuria symptoms.    In the emergency department patient was noted to be afebrile with blood pressures elevated up to 187/69, and all other vital signs maintained.  Labs significant for albumin 1.7, AST 43, and BNP 283.2.  Chest x-ray noted cardiomegaly with small bilateral pleural effusions, but no signs of pulmonary edema or focal consolidation.  Cardiology had  been consulted and felt symptoms seem less likely related to heart failure.  Orders were placed to check patient's lower extremities to ensure no signs of DVT as well as echocardiogram. Patient was given furosemide 40 mg IV x 1 dose.  Doppler ultrasound of the lower extremities did not show any signs of DVT but portions were limited   Review of Systems: As mentioned in the history of present illness. All other systems reviewed and are negative. Past Medical History:  Diagnosis Date   Aortic insufficiency    Essential hypertension 07/11/2017   Hyperlipemia    Hypertension    Hypokalemia 07/11/2017   Mitral regurgitation    Nausea, vomiting, and diarrhea 07/11/2017   Paroxysmal A-fib (HCC) 07/11/2017   Pulmonary hypertension (HCC)    Syncope, vasovagal 07/11/2017   History reviewed. No pertinent surgical history. Social History:  reports that she has never smoked. She has never used smokeless tobacco. She reports that she does not drink alcohol and does not use drugs.  No Known Allergies  Family History  Problem Relation Age of Onset   Diabetes Mother    Diabetes Sister    Diabetes Brother     Prior to Admission medications   Medication Sig Start Date End Date Taking? Authorizing Provider  apixaban (ELIQUIS) 5 MG TABS tablet TAKE 1 TABLET BY MOUTH TWICE A DAY 03/06/22  Yes Lyn Records, MD  B Complex-C (B-COMPLEX WITH VITAMIN C) tablet Take 1 tablet by mouth daily.   Yes [provider]  Calcium-Vitamin D (CALTRATE 600 PLUS-VIT D PO) Take  1 tablet by mouth in the morning and at bedtime.   Yes [provider]  ciprofloxacin-dexamethasone (CIPRODEX) OTIC suspension Place 4 drops into the left ear once a week. 11/13/21  Yes [provider]  gabapentin (NEURONTIN) 300 MG capsule Take 300 mg by mouth 2 (two) times daily.   Yes [provider]  levothyroxine (SYNTHROID) 25 MCG tablet Take 25 mcg by mouth every morning. 04/14/22  Yes [provider]  loratadine (CLARITIN) 10 MG tablet Take 10 mg by mouth daily.   Yes [provider]  losartan (COZAAR) 50 MG tablet TAKE 1 TABLET BY MOUTH EVERY DAY 12/27/21  Yes Belva Crome, MD  pravastatin (PRAVACHOL) 20 MG tablet Take 20 mg by mouth daily.   Yes [provider]  TIADYLT ER 240 MG 24 hr capsule Take 240 mg by mouth daily. 03/02/22  Yes [provider]    Physical Exam: Vitals:   05/17/22 1900 05/18/22 0013 05/18/22 0558 05/18/22 1128  BP: (!) 187/69 (!) 163/69 (!) 148/79 (!) 177/79  Pulse: 88 88 86 92  Resp: 16 15 15 16   Temp: 98.4 F (36.9 C) 98 F (36.7 C) 98 F (36.7 C) 97.9 F (36.6 C)  TempSrc: Oral     SpO2: 100% 99% 100% 100%    Constitutional: Elderly female currently in no acute distress Eyes: PERRL, lids and conjunctivae normal ENMT: Mucous membranes are moist. Neck: normal, supple Respiratory: Normal respiratory effort without significant wheezes or rhonchi appreciated Cardiovascular: Regular rate and rhythm, no murmurs / rubs / gallops.  2+ pitting bilateral lower extremity edema up to her thigh. 2+ pedal pulses.  Abdomen: no tenderness, no masses palpated.  Bowel sounds positive.  Musculoskeletal: no clubbing / cyanosis. No joint deformity upper and lower extremities. Good ROM, no contractures. Normal muscle tone.  Skin: no rashes, lesions, ulcers. No induration Neurologic: CN 2-12 grossly intact. Sensation intact, DTR normal. Strength 5/5 in all 4.  Psychiatric: Normal judgment and insight. Alert and oriented x 3. Normal mood.   Data Reviewed:  EKG reveals sinus rhythm with premature atrial complexes at 86 bpm  Assessment and Plan:  Bilateral lower extremity leg swelling Heart failure with reduced EF Acute.  Patient presents with progressive leg swelling over the last month with reports of approximately 12 pound weight gain.  At baseline patient weighs around 138 pounds per her daughter's report.  On physical exam patient  with at least 2+ pitting lower extremity edema.  BNP was elevated to 283.2. Reportedly attempted Lasix for couple days without any change in symptoms previously.  Chest x-ray showing cardiomegaly with small bilateral pleural effusions.  Last echocardiogram noted EF to be 50 to 55% with grade 2 diastolic dysfunction back in 11/2020.  Patient has been given Lasix 40 mg IV x 1 dose.  On the differential includes heart failure versus hypoalbuminemia third spacing versus possible nephrotic syndrome. -Admit to a telemetry bed -Strict I&O's and daily weights -Check echocardiogram (echocardiogram revealed EF of 40 to 45% with grade 2 diastolic dysfunction) -Lasix 40 mg IV twice daily -Cardiology consulted,  will follow-up for any further recommendations  Paroxysmal atrial fibrillation on chronic anticoagulation Patient appears to be in a sinus rhythm at this time. -Goal potassium at least 4 and magnesium at least 2 -Continue Cardizem and Eliquis  Essential hypertension On admission blood pressures initially elevated up to 187/69.   -Resume home blood pressure regimen -Low-dose hydralazine IV as needed  Proteinuria Acute.  Urinalysis noted small  hemoglobin, greater than 300, protein 11-20 RBCs/hpf today.  Previous urinalysis did not show any significant signs of protein back in 2019. -Check urine protein and urine creatinine -Consider need to formally consult nephrology in a.m. if warranted  History of prediabetes On admission glucose 95.  Patient with a possible history of prediabetes. -Check hemoglobin A1c in a.m.  Hypothyroidism Home medication regimen includes levothyroxine 25 mcg daily which daughter notes has not been changed.  Last available TSH was 6.51 back in 10/2020. -Check TSH (5.415) -Increased dose of levothyroxine to 37.5 mg daily.  Outpatient recheck TSH in outpatient setting in 4 to 6 weeks  Hypoalbuminemia Acute.  Albumin noted to be significant low at 1.7.  Patient reports  adequate appetite. -Check prealbumin  Hyperlipidemia -Check lipid panel in a.m. -Continue pravastatin   DVT prophylaxis: Eliquis Advance Care Planning:   Code Status: Full Code   Consults: Cardiology  Family Communication: Daughter updated at bedside  Severity of Illness: The appropriate patient status for this patient is INPATIENT. Inpatient status is judged to be reasonable and necessary in order to provide the required intensity of service to ensure the patient's safety. The patient's presenting symptoms, physical exam findings, and initial radiographic and laboratory data in the context of their chronic comorbidities is felt to place them at high risk for further clinical deterioration. Furthermore, it is not anticipated that the patient will be medically stable for discharge from the hospital within 2 midnights of admission.   * I certify that at the point of admission it is my clinical judgment that the patient will require inpatient hospital care spanning beyond 2 midnights from the point of admission due to high intensity of service, high risk for further deterioration and high frequency of surveillance required.*  Author: Norval Morton, MD 05/18/2022 1:35 PM  For on call review [email or web address].

## 2022-05-18 NOTE — ED Notes (Signed)
Report given and care endorsed to [facility] RN

## 2022-05-18 NOTE — ED Provider Notes (Signed)
[facility] EMERGENCY DEPARTMENT Provider Note   CSN: [id number] Arrival date & time: 05/17/22  1732     History  Chief Complaint  Patient presents with   Leg Swelling    Rita Myers is a 77 y.o. female.  Patient with a history of hypertension, atrial fibrillation on Eliquis, aortic insufficiency and pulmonary hypertension presenting with bilateral leg swelling and pain.  Symptoms have been ongoing for the past 1 month.  She has had no fall or injury.  She saw her orthopedic doctor who ordered an MRI that showed she had severe canal stenosis at L4 and L5 and some facet arthrosis.  He did not feel her leg pain was coming from her back and recommended she follow-up with her cardiologist for concerns for heart failure.  She does not have any history of heart failure.  She reports increasing lower leg swelling over the past 1 month despite taking Lasix at home for 3 days.  She was placed on this by her PCP for about a 3 days about a month ago but does not take it normally.  She reports no improvement with Lasix.  Denies any missed doses of Eliquis and no history of DVT or PE.  She has had shortness of breath intermittently but none currently.  No cough or fever.  No chest pain.  No pain with urination or blood in the urine.  No focal weakness, numbness or tingling.  Family attempted to follow-up with cardiologist for echocardiogram was not successful.  Patient seen both by her PCP as well as cardiology.  She had negative lower extremity Dopplers on December 5.  The history is provided by the patient and a relative.       Home Medications Prior to Admission medications   Medication Sig Start Date End Date Taking? Authorizing Provider  apixaban (ELIQUIS) 5 MG TABS tablet TAKE 1 TABLET BY MOUTH TWICE A DAY 03/06/22   Lyn Records, MD  B Complex-C (B-COMPLEX WITH VITAMIN C) tablet Take 1 tablet by mouth daily.    [provider]  Calcium-Vitamin D (CALTRATE 600  PLUS-VIT D PO) Take 1 tablet by mouth in the morning and at bedtime.    [provider]  ciprofloxacin-dexamethasone (CIPRODEX) OTIC suspension Place 4 drops into the left ear once a week. 11/13/21   [provider]  levothyroxine (SYNTHROID) 25 MCG tablet Take 25 mcg by mouth every morning. 04/14/22   [provider]  loratadine (CLARITIN) 10 MG tablet Take 10 mg by mouth daily.    [provider]  losartan (COZAAR) 50 MG tablet TAKE 1 TABLET BY MOUTH EVERY DAY 12/27/21   Lyn Records, MD  pravastatin (PRAVACHOL) 20 MG tablet Take 20 mg by mouth daily.    [provider]  TIADYLT ER 240 MG 24 hr capsule Take 240 mg by mouth daily. 03/02/22   [provider]      Allergies    Patient has no known allergies.    Review of Systems   Review of Systems  Constitutional:  Negative for activity change, appetite change and fever.  Respiratory:  Positive for shortness of breath.   Cardiovascular:  Positive for leg swelling.  Gastrointestinal:  Negative for abdominal pain, nausea and vomiting.  Genitourinary:  Negative for dysuria and hematuria.  Musculoskeletal:  Positive for joint swelling.  Skin:  Negative for rash.  Neurological:  Negative for dizziness, weakness and headaches.   all other systems are negative except as  noted in the HPI and PMH.    Physical Exam Updated Vital Signs BP (!) 148/79 (BP Location: Left Arm)   Pulse 86   Temp 98 F (36.7 C)   Resp 15   SpO2 100%  Physical Exam Vitals and nursing note reviewed.  Constitutional:      General: She is not in acute distress.    Appearance: She is well-developed.  HENT:     Head: Normocephalic and atraumatic.     Mouth/Throat:     Pharynx: No oropharyngeal exudate.  Eyes:     Conjunctiva/sclera: Conjunctivae normal.     Pupils: Pupils are equal, round, and reactive to light.  Neck:     Comments: No meningismus. Cardiovascular:     Rate and Rhythm: Normal rate and  regular rhythm.     Heart sounds: Normal heart sounds. No murmur heard. Pulmonary:     Effort: Pulmonary effort is normal. No respiratory distress.     Breath sounds: Normal breath sounds.  Abdominal:     Palpations: Abdomen is soft.     Tenderness: There is no abdominal tenderness. There is no guarding or rebound.  Musculoskeletal:        General: No tenderness. Normal range of motion.     Cervical back: Normal range of motion and neck supple.     Right lower leg: Edema present.     Left lower leg: Edema present.     Comments: +4 edema bilaterally.  Left slightly greater than right.  Intact DP and PT pulses.  Full range of motion bilateral hips and knees without pain.  Pain to palpation diffusely.  No overlying warmth or erythema.  Skin:    General: Skin is warm.  Neurological:     Mental Status: She is alert and oriented to person, place, and time.     Cranial Nerves: No cranial nerve deficit.     Motor: No abnormal muscle tone.     Coordination: Coordination normal.     Comments:  5/5 strength throughout. CN 2-12 intact.Equal grip strength.   Psychiatric:        Behavior: Behavior normal.     ED Results / Procedures / Treatments   Labs (all labs ordered are listed, but only abnormal results are displayed) Labs Reviewed  BRAIN NATRIURETIC PEPTIDE - Abnormal; Notable for the following components:      Result Value   B Natriuretic Peptide 283.2 (*)    All other components within normal limits  COMPREHENSIVE METABOLIC PANEL - Abnormal; Notable for the following components:   Total Protein 5.4 (*)    Albumin 1.7 (*)    AST 43 (*)    Total Bilirubin 0.2 (*)    All other components within normal limits  CBC WITH DIFFERENTIAL/PLATELET  URINALYSIS, ROUTINE W REFLEX MICROSCOPIC  PROTEIN, URINE, 24 HOUR  TSH  PROTEIN, URINE, RANDOM  CREATININE, URINE, RANDOM  TROPONIN I (HIGH SENSITIVITY)  TROPONIN I (HIGH SENSITIVITY)    EKG EKG Interpretation  Date/Time:  Thursday  May 18 2022 11:22:56 EST Ventricular Rate:  86 PR Interval:  150 QRS Duration: 82 QT Interval:  372 QTC Calculation: 445 R Axis:   59 Text Interpretation: Sinus rhythm with Premature atrial complexes Otherwise normal ECG When compared with ECG of 17-May-2022 18:53, PREVIOUS ECG IS PRESENT No significant change was found Confirmed by Ezequiel Essex [phone]) on 05/18/2022 1:38:45 PM  Radiology DG Chest 2 View  Result Date: 05/17/2022 CLINICAL DATA:  Edema in the lower extremities EXAM:  CHEST - 2 VIEW COMPARISON:  07/11/2017 FINDINGS: Transverse diameter of heart is increased. Pulmonary vascularity is unremarkable. There are no signs of pulmonary edema or focal pulmonary consolidation. There is blunting of both lateral CP angles. There is no pneumothorax. IMPRESSION: Cardiomegaly. There are no signs of pulmonary edema or focal pulmonary consolidation. Small bilateral pleural effusions. Electronically Signed   By: Elmer Picker M.D.   On: 05/17/2022 19:53    Procedures Procedures    Medications Ordered in ED Medications  furosemide (LASIX) injection 40 mg (has no administration in time range)    ED Course/ Medical Decision Making/ A&P                           Medical Decision Making Amount and/or Complexity of Data Reviewed Labs: ordered. Decision-making details documented in ED Course. Radiology: ordered and independent interpretation performed. Decision-making details documented in ED Course. ECG/medicine tests: ordered and independent interpretation performed. Decision-making details documented in ED Course.  Risk Prescription drug management.  Swelling for the past 1 month.  No hypoxia or increased work of breathing.  Clear lungs on exam. Neurovascularly intact.  Peripheral edema with concern for possible new onset CHF.  Will obtain Doppler ultrasound to rule out DVT though no missed doses of Eliquis. Chest x-ray does not show any edema.  Results reviewed interpreted  by me.  Creatinine is at baseline.  BNP only slightly elevated at 282  Will obtain venous duplex ultrasound.  Discussed with cardiology who will evaluate.  There is lower suspicion for CHF as she does not have any PND or edema on chest x-ray.  No JVD.  Her albumin is quite low which may be contributing to her presentation.  There is also some concern for nephrotic syndrome. Obtain Doppler ultrasound and echocardiogram.  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  Does not appear to be consistent with CHF will update echocardiogram.  Admission discussed with Dr. Tamala Julian       Final Clinical Impression(s) / ED Diagnoses Final diagnoses:  None    Rx / DC Orders ED Discharge Orders     None         Foday Cone, Annie Main, MD 05/18/22 1340

## 2022-05-18 NOTE — Progress Notes (Signed)
Echocardiogram 2D Echocardiogram has been performed.  Rita Myers 05/18/2022, 3:20 PM

## 2022-05-18 NOTE — Consult Note (Addendum)
Cardiology Consultation   Patient ID: Rita Myers MRN: [id number]; DOB: 15-Nov-1945  Admit date: 05/17/2022 Date of Consult: 05/18/2022  PCP:  Lujean Amel, [facility] Cardiologist:  Sinclair Grooms, MD        Patient Profile:   Rita Myers is a 77 y.o. female with a hx of  HTN, pulmonary HTN, HLD syncope, PAF, pulmonary HTN by echo 2020, myxomatous mitral valve with mild regurgitation, mild AI  who is being seen 05/18/2022 for the evaluation of bilateral lower extremity edema at the request of Dr. Wyvonnia Dusky.  History of Present Illness:   Ms. Tomlinson presented to the ED on the evening of 1/3 at the suggestion of her orthopedist for evaluation of suspected heart failure. For approximately the last month and a half, patient has noted bilateral lower extremity swelling and pain. She was in [place] in November visiting her daughter when swelling and pain in left lower leg prompted urgent care visit. She was ultimately sent to the ED and was noted to have a clot in the left lesser saphenous vein. She followed up with her PCP when she returned home and he started her on Lasix 20 mg for 5 days. Swelling persisted and she was seen by cardiology NP Ambrose Pancoast on 04/18/22. Per his physical exam, patient had 1+ edema in bilateral legs and otherwise appeared euvolemic. Patient was again placed on lasix for 5 days but had no improvement in edema. A repeat lower extremity doppler was negative.  Today patient confirms that she's had persistent lower extremity swelling since early to mid November. She otherwise denies symptoms of heart failure/volume overload and says that she has not experienced decreased exertional tolerance, chest pain, orthopnea. Also denies palpitations or symptoms concerning for afib. She does admit to decreased activity as a result of leg swelling. Denies recent illness, fevers, chills, nausea, vomiting, diarrhea.  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    Past Medical History:  Diagnosis Date   Aortic insufficiency    Essential hypertension 07/11/2017   Hyperlipemia    Hypertension    Hypokalemia 07/11/2017   Mitral regurgitation    Nausea, vomiting, and diarrhea 07/11/2017   Paroxysmal A-fib ([place]) 07/11/2017   Pulmonary hypertension ([place])    Syncope, vasovagal 07/11/2017    History reviewed. No pertinent surgical history.   Home Medications:  Prior to Admission medications   Medication Sig Start Date End Date Taking? Authorizing Provider  apixaban (ELIQUIS) 5 MG TABS tablet TAKE 1 TABLET BY MOUTH TWICE A DAY 03/06/22   Belva Crome, MD  B Complex-C (B-COMPLEX WITH VITAMIN C) tablet Take 1 tablet by mouth daily.    [provider]  Calcium-Vitamin D (CALTRATE 600 PLUS-VIT D PO) Take 1 tablet by mouth in the morning and at bedtime.    [provider]  ciprofloxacin-dexamethasone (CIPRODEX) OTIC suspension Place 4 drops into the left ear once a week. 11/13/21   [provider]  levothyroxine (SYNTHROID) 25 MCG tablet Take 25 mcg by mouth every morning. 04/14/22   [provider]  loratadine (CLARITIN) 10 MG tablet Take 10 mg by mouth daily.    [provider]  losartan (COZAAR) 50 MG tablet TAKE 1 TABLET BY MOUTH EVERY DAY 12/27/21   Belva Crome, MD  pravastatin (PRAVACHOL) 20 MG  tablet Take 20 mg by mouth daily.    [provider]  TIADYLT ER 240 MG 24 hr capsule Take 240 mg by mouth daily. 03/02/22   [provider]    Inpatient Medications: Scheduled Meds:  furosemide  40 mg Intravenous Once   Continuous Infusions:  PRN Meds:   Allergies:   No Known Allergies  Social History:   Social History   Socioeconomic History   Marital status: Married    Spouse name: Not on file    Number of children: Not on file   Years of education: Not on file   Highest education level: Not on file  Occupational History   Not on file  Tobacco Use   Smoking status: Never   Smokeless tobacco: Never  Vaping Use   Vaping Use: Never used  Substance and Sexual Activity   Alcohol use: No   Drug use: No   Sexual activity: Not on file  Other Topics Concern   Not on file  Social History Narrative   Not on file   Social Determinants of Health   Financial Resource Strain: Not on file  Food Insecurity: Not on file  Transportation Needs: Not on file  Physical Activity: Not on file  Stress: Not on file  Social Connections: Not on file  Intimate Partner Violence: Not on file    Family History:    Family History  Problem Relation Age of Onset   Diabetes Mother    Diabetes Sister    Diabetes Brother      ROS:  Please see the history of present illness.   All other ROS reviewed and negative.     Physical Exam/Data:   Vitals:   05/17/22 1900 05/18/22 0013 05/18/22 0558 05/18/22 1128  BP: (!) 187/69 (!) 163/69 (!) 148/79 (!) 177/79  Pulse: 88 88 86 92  Resp: 16 15 15 16   Temp: 98.4 F (36.9 C) 98 F (36.7 C) 98 F (36.7 C) 97.9 F (36.6 C)  TempSrc: Oral     SpO2: 100% 99% 100% 100%   No intake or output data in the 24 hours ending 05/18/22 1155    04/18/2022    8:26 AM 12/14/2021   10:37 AM 10/27/2020   11:39 AM  Last 3 Weights  Weight (lbs) 141 lb 139 lb 6.4 oz 144 lb  Weight (kg) 63.957 kg 63.231 kg 65.318 kg     There is no height or weight on file to calculate BMI.  General:  Well nourished, well developed, in no acute distress HEENT: normal Neck: no JVD Vascular: No carotid bruits; Distal pulses 2+ bilaterally Cardiac:  normal S1, S2; RRR; no murmur  Lungs:  clear to auscultation bilaterally, no wheezing, rhonchi or rales  Abd: soft, nontender, no hepatomegaly  Ext: 2+ bilateral lower extremity to knee Musculoskeletal:  No deformities, BUE and BLE  strength normal and equal Skin: warm and dry  Neuro:  CNs 2-12 intact, no focal abnormalities noted Psych:  Normal affect   EKG:  The EKG was personally reviewed and demonstrates:  sinus rhythm  Telemetry:  Telemetry was personally reviewed and demonstrates:  sinus rhythm  Relevant CV Studies:  11/22/20 TTE  IMPRESSIONS     1. Left ventricular ejection fraction, by estimation, is 50 to 55%. The  left ventricle has low normal function. The left ventricle has no regional  wall motion abnormalities. The left ventricular internal cavity size was  mildly dilated. Left ventricular  diastolic parameters are consistent with  Grade II diastolic dysfunction  (pseudonormalization). Elevated left ventricular end-diastolic pressure.   2. Right ventricular systolic function is normal. The right ventricular  size is normal.   3. The pericardial effusion is posterior to the left ventricle.   4. The mitral valve is abnormal. Mild mitral valve regurgitation. No  evidence of mitral stenosis. Moderate mitral annular calcification.   5. The aortic valve is tricuspid. There is mild calcification of the  aortic valve. Aortic valve regurgitation is mild. Mild aortic valve  sclerosis is present, with no evidence of aortic valve stenosis.   6. The inferior vena cava is normal in size with greater than 50%  respiratory variability, suggesting right atrial pressure of 3 mmHg.   FINDINGS   Left Ventricle: Left ventricular ejection fraction, by estimation, is 50  to 55%. The left ventricle has low normal function. The left ventricle has  no regional wall motion abnormalities. The left ventricular internal  cavity size was mildly dilated.  There is no left ventricular hypertrophy. Left ventricular diastolic  parameters are consistent with Grade II diastolic dysfunction  (pseudonormalization). Elevated left ventricular end-diastolic pressure.   Right Ventricle: The right ventricular size is normal. No  increase in  right ventricular wall thickness. Right ventricular systolic function is  normal.   Left Atrium: Left atrial size was normal in size.   Right Atrium: Right atrial size was normal in size.   Pericardium: Trivial pericardial effusion is present. The pericardial  effusion is posterior to the left ventricle.   Mitral Valve: The mitral valve is abnormal. There is moderate thickening  of the mitral valve leaflet(s). There is moderate calcification of the  mitral valve leaflet(s). Moderate mitral annular calcification. Mild  mitral valve regurgitation. No evidence  of mitral valve stenosis.   Tricuspid Valve: The tricuspid valve is normal in structure. Tricuspid  valve regurgitation is mild . No evidence of tricuspid stenosis.   Aortic Valve: The aortic valve is tricuspid. There is mild calcification  of the aortic valve. Aortic valve regurgitation is mild. Aortic  regurgitation PHT measures 433 msec. Mild aortic valve sclerosis is  present, with no evidence of aortic valve  stenosis.   Pulmonic Valve: The pulmonic valve was normal in structure. Pulmonic valve  regurgitation is not visualized. No evidence of pulmonic stenosis.   Aorta: The aortic root is normal in size and structure.   Venous: The inferior vena cava is normal in size with greater than 50%  respiratory variability, suggesting right atrial pressure of 3 mmHg.   IAS/Shunts: The interatrial septum was not well visualized.   Laboratory Data:  High Sensitivity Troponin:  No results for input(s): "TROPONINIHS" in the last 720 hours.   Chemistry Recent Labs  Lab 05/17/22 1927  NA 137  K 3.9  CL 104  CO2 26  GLUCOSE 95  BUN 23  CREATININE 0.83  CALCIUM 9.0  GFRNONAA >60  ANIONGAP 7    Recent Labs  Lab 05/17/22 1927  PROT 5.4*  ALBUMIN 1.7*  AST 43*  ALT 36  ALKPHOS 50  BILITOT 0.2*   Lipids No results for input(s): "CHOL", "TRIG", "HDL", "LABVLDL", "LDLCALC", "CHOLHDL" in the last 168  hours.  Hematology Recent Labs  Lab 05/17/22 1927  WBC 7.3  RBC 4.51  HGB 13.8  HCT 42.6  MCV 94.5  MCH 30.6  MCHC 32.4  RDW 12.7  PLT 199   Thyroid No results for input(s): "TSH", "FREET4" in the last 168 hours.  BNP Recent Labs  Lab 05/17/22 1927  BNP 283.2*    DDimer No results for input(s): "DDIMER" in the last 168 hours.   Radiology/Studies:  DG Chest 2 View  Result Date: 05/17/2022 CLINICAL DATA:  Edema in the lower extremities EXAM: CHEST - 2 VIEW COMPARISON:  07/11/2017 FINDINGS: Transverse diameter of heart is increased. Pulmonary vascularity is unremarkable. There are no signs of pulmonary edema or focal pulmonary consolidation. There is blunting of both lateral CP angles. There is no pneumothorax. IMPRESSION: Cardiomegaly. There are no signs of pulmonary edema or focal pulmonary consolidation. Small bilateral pleural effusions. Electronically Signed   By: Elmer Picker M.D.   On: 05/17/2022 19:53     Assessment and Plan:   Marietta Bushnell is a 77 y.o. female with a hx of  HTN, HLD, syncope, PAF, pulmonary HTN by echo 2020, myxomatous mitral valve with mild regurgitation, mild AI  who is being seen 05/18/2022 for the evaluation of bilateral lower extremity edema at the request of Dr. Wyvonnia Dusky.  Lower extremity edema  Patient with persistent bilateral lower extremity edema for almost 2 months. Little to no response to two separate courses of Lasix (one by PCP, one by cardiology). Negative lower extremity doppler in early December. BNP 283.2 today. CXR with cardiomegaly and small bilateral pleural effusions but no evidence of pulmonary edema. Last TTE in 2022 showed LVEF 50-55% and grade II diastolic dysfunction.  Etiology of LE edema is unclear. Patient has just received 40mg  IV lasix in the ED. This may not reduce swelling as she is without obvious evidence of intravascular edema, no JVP or pulmonary edema on physical exam.  Concerning that albumin down to 1.7. Given  no apparent dietary issues, would check UA to look for protein.  Daily use of Cardizem 240mg  may also be contributing to edema.  Agree with TTE to assess for new HF.   Paroxysmal atrial fibrillation  Patient without symptoms of palpitations, elevated HR. Telemetry and ECG with sinus rhythm only.  Continue home Cardizem 240mg . May need to consider alternative rate control agent with lower extremity edema Continue Eliquis 5mg  BID.  Hypertension  Per most recent cardiology OV note, patient on Losartan 50mg , Microzide 12.5mg  in addition to Cardizem 240mg . Hypertensive in the ED today having not received home meds.   Resume home antihypertensive regimen  Hyperlipidemia  Continue pravastatin   Risk Assessment/Risk Scores:       New York Heart Association (NYHA) Functional Class NYHA Class II  CHA2DS2-VASc Score = 4   This indicates a 4.8% annual risk of stroke. The patient's score is based upon: CHF History: 0 HTN History: 1 Diabetes History: 0 Stroke History: 0 Vascular Disease History: 0 Age Score: 2 Gender Score: 1    For questions or updates, please contact [place] Please consult [email or web address] for contact info under    Signed, Lily Kocher, PA-C  05/18/2022 11:55 AM As above, patient seen and examined.  Briefly she is a 77 year old female with past medical history of hypertension, paroxysmal atrial fibrillation, hyperlipidemia for evaluation of bilateral lower extremity edema question congestive heart failure.  Most recent echocardiogram July 2022 showed normal LV function, grade 2 diastolic dysfunction, mild mitral regurgitation, mild aortic insufficiency.  Patient recently in [place] and developed lower extremity edema.  Venous Dopplers by report showed thrombus in her left lesser saphenous vein.  She was treated with a short course of Lasix with no significant improvement.  She states she has had bilateral lower extremity edema that is gradually  worsening for 2 months.  It is now up to the hips.  She denies dyspnea, chest pain, palpitations or syncope.  No orthopnea or PND.  No fevers, chills or productive cough.  She denies diarrhea or dysuria.  Patient did have follow-up venous Dopplers April 18, 2022 that showed no DVT bilaterally. On exam she has 3+ edema to her hips bilaterally. Chest x-ray shows cardiomegaly, small effusions but no edema.  Sodium 137, potassium 3.9, BUN 23, creatinine 0.83, albumin 1.7, SGOT 43, BNP 283, troponin 12, hemoglobin 13.8.  Electrocardiogram shows sinus rhythm with no ST changes.  1 bilateral lower extremity edema-etiology unclear.  Significant edema on examination and tenderness to palpation.  She does not have significant JVD on examination.  BNP mildly elevated.  Will give Lasix 40 mg IV daily.  Will arrange echocardiogram to assess LV function.  I am concerned about her albumin of 1.7 causing her edema.  She apparently has good appetite and good p.o. intake.  Will check urine for protein and 24-hour urine as well to rule out nephrotic syndrome.  She does not have significant diarrhea or other symptoms to suggest protein-losing enteropathy.  Will await above studies.  Will also recheck TSH.  2 hypertension-patient's blood pressure is elevated.  Increase losartan 100 mg daily and follow.  3 paroxysmal atrial fibrillation-we will continue Cardizem for rate control if atrial fibrillation recurs.  Continue apixaban.  I do not think Cardizem is the cause of her lower extremity edema as she has been on this for quite some time and her edema began in the past 2 months.  Kirk Ruths, MD

## 2022-05-19 ENCOUNTER — Ambulatory Visit: Payer: Medicare Other | Admitting: Nurse Practitioner

## 2022-05-19 ENCOUNTER — Encounter: Payer: Medicare Other | Admitting: Physical Therapy

## 2022-05-19 DIAGNOSIS — I480 Paroxysmal atrial fibrillation: Secondary | ICD-10-CM | POA: Diagnosis not present

## 2022-05-19 DIAGNOSIS — I429 Cardiomyopathy, unspecified: Secondary | ICD-10-CM | POA: Diagnosis not present

## 2022-05-19 DIAGNOSIS — M7989 Other specified soft tissue disorders: Secondary | ICD-10-CM | POA: Diagnosis not present

## 2022-05-19 LAB — LIPID PANEL
Cholesterol: 277 mg/dL — ABNORMAL HIGH (ref 0–200)
HDL: 77 mg/dL (ref >40)
LDL Cholesterol: 167 mg/dL — ABNORMAL HIGH (ref 0–99)
Total CHOL/HDL Ratio: 3.6 RATIO
Triglycerides: 166 mg/dL — ABNORMAL HIGH (ref <150)
VLDL: 33 mg/dL (ref 0–40)

## 2022-05-19 LAB — BASIC METABOLIC PANEL
Anion gap: 6 (ref 5–15)
BUN: 28 mg/dL — ABNORMAL HIGH (ref 8–23)
CO2: 27 mmol/L (ref 22–32)
Calcium: 8.1 mg/dL — ABNORMAL LOW (ref 8.9–10.3)
Chloride: 107 mmol/L (ref 98–111)
Creatinine, Ser: 0.96 mg/dL (ref 0.44–1.00)
GFR, Estimated: >60 mL/min (ref >60)
Glucose, Bld: 97 mg/dL (ref 70–99)
Potassium: 4.2 mmol/L (ref 3.5–5.1)
Sodium: 140 mmol/L (ref 135–145)

## 2022-05-19 LAB — CBC
HCT: 33.1 % — ABNORMAL LOW (ref 36.0–46.0)
Hemoglobin: 11.4 g/dL — ABNORMAL LOW (ref 12.0–15.0)
MCH: 31.1 pg (ref 26.0–34.0)
MCHC: 34.4 g/dL (ref 30.0–36.0)
MCV: 90.4 fL (ref 80.0–100.0)
Platelets: 160 10*3/uL (ref 150–400)
RBC: 3.66 MIL/uL — ABNORMAL LOW (ref 3.87–5.11)
RDW: 12.8 % (ref 11.5–15.5)
WBC: 6.7 10*3/uL (ref 4.0–10.5)
nRBC: 0 % (ref 0.0–0.2)

## 2022-05-19 LAB — HEMOGLOBIN A1C
Hgb A1c MFr Bld: 6.1 % — ABNORMAL HIGH (ref 4.8–5.6)
Mean Plasma Glucose: 128 mg/dL

## 2022-05-19 LAB — HIV ANTIBODY (ROUTINE TESTING W REFLEX): HIV Screen 4th Generation wRfx: NONREACTIVE

## 2022-05-19 LAB — PROTEIN / CREATININE RATIO, URINE
Creatinine, Urine: 16 mg/dL
Protein Creatinine Ratio: 5.19 mg/mg{Cre} — ABNORMAL HIGH (ref 0.00–0.15)
Total Protein, Urine: 83 mg/dL

## 2022-05-19 LAB — MRSA NEXT GEN BY PCR, NASAL: MRSA by PCR Next Gen: NOT DETECTED

## 2022-05-19 LAB — T4, FREE: Free T4: 1.03 ng/dL (ref 0.61–1.12)

## 2022-05-19 LAB — MAGNESIUM: Magnesium: 1.9 mg/dL (ref 1.7–2.4)

## 2022-05-19 LAB — HEPATITIS B SURFACE ANTIGEN: Hepatitis B Surface Ag: NONREACTIVE

## 2022-05-19 MED ORDER — ROSUVASTATIN CALCIUM 5 MG PO TABS
10.0000 mg | ORAL_TABLET | Freq: Every day | ORAL | Status: DC
  Administered 2022-05-20 – 2022-05-24 (×5): 10 mg via ORAL
  Filled 2022-05-19 (×5): qty 2

## 2022-05-19 MED ORDER — FUROSEMIDE 10 MG/ML IJ SOLN: 40.0000 mg | Freq: Every day | INTRAMUSCULAR | Status: DC

## 2022-05-19 MED ORDER — METOPROLOL SUCCINATE ER 25 MG PO TB24
25.0000 mg | ORAL_TABLET | Freq: Every day | ORAL | Status: DC
  Administered 2022-05-19 – 2022-05-24 (×6): 25 mg via ORAL
  Filled 2022-05-19 (×6): qty 1

## 2022-05-19 MED ORDER — FUROSEMIDE 10 MG/ML IJ SOLN
60.0000 mg | Freq: Three times a day (TID) | INTRAMUSCULAR | Status: DC
  Administered 2022-05-19 – 2022-05-20 (×2): 60 mg via INTRAVENOUS
  Filled 2022-05-19 (×2): qty 6

## 2022-05-19 MED ORDER — FUROSEMIDE 10 MG/ML IJ SOLN
40.0000 mg | Freq: Two times a day (BID) | INTRAMUSCULAR | Status: DC
  Administered 2022-05-19: 40 mg via INTRAVENOUS
  Filled 2022-05-19: qty 4

## 2022-05-19 NOTE — Progress Notes (Addendum)
Heart Failure Navigator Progress Note  Assessed for Heart & Vascular TOC clinic readiness.  Patient does not meet criteria due to patient has close follow up with [facility] 05/26/22.  Navigator will sign off at this time.   Earnestine Leys, BSN, Clinical cytogeneticist Only

## 2022-05-19 NOTE — Progress Notes (Deleted)
Cardiology Office Note:    Date:  05/19/2022   ID:  Dafina Suk, DOB Mar 27, 1946, MRN [id number]  PCP:  Lujean Amel, MD   [facility] Providers Cardiologist:  Sinclair Grooms, MD { Click to update primary MD,subspecialty MD or APP then REFRESH:1}    Referring MD: Lujean Amel, MD   Chief Complaint: ***  History of Present Illness:    Rita Myers is a *** 77 y.o. female with a hx of ***  Past Medical History:  Diagnosis Date   Aortic insufficiency    Essential hypertension 07/11/2017   Hyperlipemia    Hypertension    Hypokalemia 07/11/2017   Mitral regurgitation    Nausea, vomiting, and diarrhea 07/11/2017   Paroxysmal A-fib ([place]) 07/11/2017   Pulmonary hypertension ([place])    Syncope, vasovagal 07/11/2017    No past surgical history on file.  Current Medications: No outpatient medications have been marked as taking for the 05/19/22 encounter (Appointment) with Ann Maki, Lanice Schwab, NP.     Allergies:   Patient has no known allergies.   Social History   Socioeconomic History   Marital status: Married    Spouse name: Not on file   Number of children: Not on file   Years of education: Not on file   Highest education level: Not on file  Occupational History   Not on file  Tobacco Use   Smoking status: Never   Smokeless tobacco: Never  Vaping Use   Vaping Use: Never used  Substance and Sexual Activity   Alcohol use: No   Drug use: No   Sexual activity: Not on file  Other Topics Concern   Not on file  Social History Narrative   Not on file   Social Determinants of Health   Financial Resource Strain: Not on file  Food Insecurity: No Food Insecurity (05/19/2022)   Hunger Vital Sign    Worried About Running Out of Food in the Last Year: Never true    Ran Out of Food in the Last Year: Never true  Transportation Needs: No Transportation Needs (05/19/2022)   PRAPARE - Hydrologist (Medical): No    Lack of Transportation  (Non-Medical): No  Physical Activity: Not on file  Stress: Not on file  Social Connections: Not on file     Family History: The patient's ***family history includes Diabetes in her brother, mother, and sister.  ROS:   Please see the history of present illness.    *** All other systems reviewed and are negative.  Labs/Other Studies Reviewed:    The following studies were reviewed today: ***  Recent Labs: 04/18/2022: NT-Pro BNP 717 05/17/2022: ALT 36; B Natriuretic Peptide 283.2 05/18/2022: TSH 5.415 05/19/2022: BUN 28; Creatinine, Ser 0.96; Hemoglobin 11.4; Magnesium 1.9; Platelets 160; Potassium 4.2; Sodium 140  Recent Lipid Panel    Component Value Date/Time   CHOL 277 (H) 05/19/2022 0055   TRIG 166 (H) 05/19/2022 0055   HDL 77 05/19/2022 0055   CHOLHDL 3.6 05/19/2022 0055   VLDL 33 05/19/2022 0055   LDLCALC 167 (H) 05/19/2022 0055     Risk Assessment/Calculations:   {Does this patient have ATRIAL FIBRILLATION?:[id number]}       Physical Exam:    VS:  There were no vitals taken for this visit.    Wt Readings from Last 3 Encounters:  04/18/22 141 lb (64 kg)  12/14/21 139 lb 6.4 oz (63.2 kg)  10/27/20 144 lb (65.3 kg)  GEN: *** Well nourished, well developed in no acute distress HEENT: Normal NECK: No JVD; No carotid bruits CARDIAC: ***RRR, no murmurs, rubs, gallops RESPIRATORY:  Clear to auscultation without rales, wheezing or rhonchi  ABDOMEN: Soft, non-tender, non-distended MUSCULOSKELETAL:  No edema; No deformity. *** pedal pulses, ***bilaterally SKIN: Warm and dry NEUROLOGIC:  Alert and oriented x 3 PSYCHIATRIC:  Normal affect   EKG:  EKG is *** ordered today.  The ekg ordered today demonstrates ***       Diagnoses:    No diagnosis found. Assessment and Plan:     ***          {Are you ordering a CV Procedure (e.g. stress test, cath, DCCV, TEE, etc)?   Press F2        :[id number]}   Disposition:  Medication Adjustments/Labs and Tests  Ordered: Current medicines are reviewed at length with the patient today.  Concerns regarding medicines are outlined above.  No orders of the defined types were placed in this encounter.  No orders of the defined types were placed in this encounter.   There are no Patient Instructions on file for this visit.   Signed, Emmaline Life, NP  05/19/2022 5:51 AM    [place]

## 2022-05-19 NOTE — Progress Notes (Addendum)
Rounding Note    Patient Name: Rita Myers Date of Encounter: 05/19/2022  [facility] Cardiologist: Lesleigh Noe, MD   Subjective   Some pain in left chest with inspiration; no dyspnea  Inpatient Medications    Scheduled Meds:  apixaban  5 mg Oral BID   diltiazem  240 mg Oral Daily   furosemide  40 mg Intravenous BID   gabapentin  300 mg Oral BID   levothyroxine  37.5 mcg Oral q morning   losartan  50 mg Oral Daily   pravastatin  20 mg Oral Daily   sodium chloride flush  3 mL Intravenous Q12H   Continuous Infusions:  PRN Meds: acetaminophen **OR** acetaminophen, ondansetron **OR** ondansetron (ZOFRAN) IV   Vital Signs    Vitals:   05/18/22 2335 05/19/22 0437 05/19/22 0620 05/19/22 0700  BP: 129/67 127/65  124/67  Pulse: 74 63  66  Resp: 16 16  14   Temp: 98.1 F (36.7 C) 98 F (36.7 C)  98 F (36.7 C)  TempSrc: Oral Oral  Oral  SpO2: 98% 97%  97%  Weight:   67.8 kg   Height:   4\' 2"  (1.27 m)     Intake/Output Summary (Last 24 hours) at 05/19/2022 0802 Last data filed at 05/18/2022 1613 Gross per 24 hour  Intake 3 ml  Output 400 ml  Net -397 ml      05/19/2022    6:20 AM 04/18/2022    8:26 AM 12/14/2021   10:37 AM  Last 3 Weights  Weight (lbs) 149 lb 7.6 oz 141 lb 139 lb 6.4 oz  Weight (kg) 67.8 kg 63.957 kg 63.231 kg      Telemetry    NSR with pacs and PVCs - Personally Reviewed  Physical Exam   GEN: No acute distress.   Neck: No JVD Cardiac: RRR, no murmurs, rubs, or gallops.  Respiratory: Clear to auscultation bilaterally. GI: Soft, nontender, non-distended  MS: 2+ edema Neuro:  Nonfocal  Psych: Normal affect   Labs    High Sensitivity Troponin:   Recent Labs  Lab 05/18/22 1134 05/18/22 1406  TROPONINIHS 12 14     Chemistry Recent Labs  Lab 05/17/22 1927 05/19/22 0055  NA 137 140  K 3.9 4.2  CL 104 107  CO2 26 27  GLUCOSE 95 97  BUN 23 28*  CREATININE 0.83 0.96  CALCIUM 9.0 8.1*  MG  --  1.9  PROT 5.4*  --    ALBUMIN 1.7*  --   AST 43*  --   ALT 36  --   ALKPHOS 50  --   BILITOT 0.2*  --   GFRNONAA >60 >60  ANIONGAP 7 6    Lipids  Recent Labs  Lab 05/19/22 0055  CHOL 277*  TRIG 166*  HDL 77  LDLCALC 167*  CHOLHDL 3.6    Hematology Recent Labs  Lab 05/17/22 1927 05/19/22 0055  WBC 7.3 6.7  RBC 4.51 3.66*  HGB 13.8 11.4*  HCT 42.6 33.1*  MCV 94.5 90.4  MCH 30.6 31.1  MCHC 32.4 34.4  RDW 12.7 12.8  PLT 199 160   Thyroid  Recent Labs  Lab 05/18/22 1315 05/19/22 0055  TSH 5.415*  --   FREET4  --  1.03    BNP Recent Labs  Lab 05/17/22 1927  BNP 283.2*     Radiology    ECHOCARDIOGRAM COMPLETE  Result Date: 05/18/2022    ECHOCARDIOGRAM REPORT   Patient Name:  Rita Myers Date of Exam: 05/18/2022 Medical Rec #:  [id number]   Height:       52.0 in Accession #:    [id number]  Weight:       141.0 lb Date of Birth:  [birth year]  BSA:          1.451 m Patient Age:    77 years    BP:           177/79 mmHg Patient Gender: F           HR:           93 bpm. Exam Location:  Inpatient Procedure: 2D Echo, Cardiac Doppler and Color Doppler Indications:    Dyspnea R06.00  History:        Patient has prior history of Echocardiogram examinations, most                 recent 11/22/2020. Arrythmias:Atrial Fibrillation,                 Signs/Symptoms:Syncope; Risk Factors:Hypertension, Diabetes and                 Dyslipidemia.  Sonographer:    Lucendia Herrlich Referring Phys: [phone] STEPHEN RANCOUR IMPRESSIONS  1. Left ventricular ejection fraction, by estimation, is 40 to 45%. The left ventricle has mildly decreased function. The left ventricle demonstrates global hypokinesis. Left ventricular diastolic parameters are consistent with Grade II diastolic dysfunction (pseudonormalization). Elevated left atrial pressure.  2. Right ventricular systolic function is normal. The right ventricular size is normal. There is mildly elevated pulmonary artery systolic pressure. The estimated right ventricular  systolic pressure is 43.4 mmHg.  3. Left atrial size was mildly dilated.  4. A small pericardial effusion is present.  5. The mitral valve is abnormal. Mild mitral valve regurgitation. Moderate mitral annular calcification.  6. The aortic valve is tricuspid. Aortic valve regurgitation is trivial. Aortic valve sclerosis is present, with no evidence of aortic valve stenosis.  7. The inferior vena cava is normal in size with greater than 50% respiratory variability, suggesting right atrial pressure of 3 mmHg. FINDINGS  Left Ventricle: Left ventricular ejection fraction, by estimation, is 40 to 45%. The left ventricle has mildly decreased function. The left ventricle demonstrates global hypokinesis. The left ventricular internal cavity size was normal in size. There is  no left ventricular hypertrophy. Left ventricular diastolic parameters are consistent with Grade II diastolic dysfunction (pseudonormalization). Elevated left atrial pressure. Right Ventricle: The right ventricular size is normal. No increase in right ventricular wall thickness. Right ventricular systolic function is normal. There is mildly elevated pulmonary artery systolic pressure. The tricuspid regurgitant velocity is 3.18  m/s, and with an assumed right atrial pressure of 3 mmHg, the estimated right ventricular systolic pressure is 43.4 mmHg. Left Atrium: Left atrial size was mildly dilated. Right Atrium: Right atrial size was normal in size. Pericardium: A small pericardial effusion is present. Mitral Valve: The mitral valve is abnormal. Moderate mitral annular calcification. Mild mitral valve regurgitation. Tricuspid Valve: The tricuspid valve is normal in structure. Tricuspid valve regurgitation is mild. Aortic Valve: The aortic valve is tricuspid. Aortic valve regurgitation is trivial. Aortic regurgitation PHT measures 660 msec. Aortic valve sclerosis is present, with no evidence of aortic valve stenosis. Aortic valve mean gradient measures 5.0  mmHg. Aortic valve peak gradient measures 9.3 mmHg. Aortic valve area, by VTI measures 1.85 cm. Pulmonic Valve: The pulmonic valve was grossly normal. Pulmonic valve regurgitation is trivial. Aorta: The aortic  root and ascending aorta are structurally normal, with no evidence of dilitation. Venous: The inferior vena cava is normal in size with greater than 50% respiratory variability, suggesting right atrial pressure of 3 mmHg. IAS/Shunts: The interatrial septum was not well visualized.  LEFT VENTRICLE PLAX 2D LVIDd:         5.20 cm   Diastology LVIDs:         4.10 cm   LV e' medial:    4.13 cm/s LV PW:         0.90 cm   LV E/e' medial:  23.9 LV IVS:        0.90 cm   LV e' lateral:   4.90 cm/s LVOT diam:     1.90 cm   LV E/e' lateral: 20.1 LV SV:         53 LV SV Index:   37 LVOT Area:     2.84 cm  RIGHT VENTRICLE             IVC RV S prime:     18.10 cm/s  IVC diam: 1.20 cm TAPSE (M-mode): 3.0 cm LEFT ATRIUM           Index        RIGHT ATRIUM           Index LA diam:      4.30 cm 2.96 cm/m   RA Area:     13.20 cm LA Vol (A2C): 31.1 ml 21.44 ml/m  RA Volume:   28.30 ml  19.51 ml/m LA Vol (A4C): 73.6 ml 50.74 ml/m  AORTIC VALVE AV Area (Vmax):    1.87 cm AV Area (Vmean):   1.90 cm AV Area (VTI):     1.85 cm AV Vmax:           152.50 cm/s AV Vmean:          105.350 cm/s AV VTI:            0.286 m AV Peak Grad:      9.3 mmHg AV Mean Grad:      5.0 mmHg LVOT Vmax:         100.80 cm/s LVOT Vmean:        70.567 cm/s LVOT VTI:          0.187 m LVOT/AV VTI ratio: 0.65 AI PHT:            660 msec  AORTA Ao Root diam: 2.70 cm Ao Asc diam:  2.30 cm MITRAL VALVE                TRICUSPID VALVE MV Area (PHT): 3.57 cm     TR Peak grad:   40.4 mmHg MV Decel Time: 213 msec     TR Vmax:        318.00 cm/s MR Peak grad: 173.7 mmHg MR Mean grad: 124.0 mmHg    SHUNTS MR Vmax:      659.00 cm/s   Systemic VTI:  0.19 m MR Vmean:     537.0 cm/s    Systemic Diam: 1.90 cm MV E velocity: 98.65 cm/s MV A velocity: 129.50 cm/s MV E/A  ratio:  0.76 Oswaldo Milian MD Electronically signed by Oswaldo Milian MD Signature Date/Time: 05/18/2022/3:51:14 PM    Final    VAS [place] LOWER EXTREMITY VENOUS (DVT) (ONLY [facility] & [facility])  Result Date: 05/18/2022  Lower Venous DVT Study Patient Name:  Rita Myers  Date of Exam:   05/18/2022 Medical Rec #: [id number]  Accession #:    [id number] Date of Birth: May 30, 1945   Patient Gender: F Patient Age:   71 years Exam Location:  [facility] Procedure:      VAS [place] LOWER EXTREMITY VENOUS (DVT) Referring Phys: Glynn Octave --------------------------------------------------------------------------------  Indications: Pain.  Risk Factors: None identified. Limitations: Body habitus, poor ultrasound/tissue interface and patient pain tolerance. Comparison Study: 04/18/2022 - BILATERAL:                   - No evidence of deep vein thrombosis seen in the lower                   extremities,                   bilaterally.                   -                   RIGHT:                   - A cystic structure found in the popliteal fossa.                   - 2.2 x .6 x 2.6 cm fluid collection seen in posterior knee                   fossa.                   Moderate suprapatellar fluid seen and superficial edema noted                   in calf.                    LEFT:                   - Acomplex cystic structure is found in the popliteal fossa.                   - 6.6 x 4.8 x 5.2 cm complex appearing Baker's cyst. Moderate                   to severe                   suprapatellar fluid noted with superficial edema in the calf. Performing Technologist: Chanda Busing RVT  Examination Guidelines: A complete evaluation includes B-mode imaging, spectral Doppler, color Doppler, and power Doppler as needed of all accessible portions of each vessel. Bilateral testing is considered an integral part of a complete examination. Limited examinations for reoccurring indications may be performed as noted. The reflux portion of  the exam is performed with the patient in reverse Trendelenburg.  +---------+---------------+---------+-----------+----------+--------------+ RIGHT    CompressibilityPhasicitySpontaneityPropertiesThrombus Aging +---------+---------------+---------+-----------+----------+--------------+ CFV      Full           Yes      Yes                                 +---------+---------------+---------+-----------+----------+--------------+ SFJ      Full                                                        +---------+---------------+---------+-----------+----------+--------------+  FV Prox  Full                                                        +---------+---------------+---------+-----------+----------+--------------+ FV Mid   Full                                                        +---------+---------------+---------+-----------+----------+--------------+ FV DistalFull                                                        +---------+---------------+---------+-----------+----------+--------------+ PFV      Full                                                        +---------+---------------+---------+-----------+----------+--------------+ POP      Full           Yes      Yes                                 +---------+---------------+---------+-----------+----------+--------------+ PTV      Full                                                        +---------+---------------+---------+-----------+----------+--------------+ PERO     Full                                                        +---------+---------------+---------+-----------+----------+--------------+   +---------+---------------+---------+-----------+----------+--------------+ LEFT     CompressibilityPhasicitySpontaneityPropertiesThrombus Aging +---------+---------------+---------+-----------+----------+--------------+ CFV      Full           Yes      Yes                                  +---------+---------------+---------+-----------+----------+--------------+ SFJ      Full                                                        +---------+---------------+---------+-----------+----------+--------------+ FV Prox  Full                                                        +---------+---------------+---------+-----------+----------+--------------+  FV Mid                  Yes      Yes                                 +---------+---------------+---------+-----------+----------+--------------+ FV Distal               Yes      Yes                                 +---------+---------------+---------+-----------+----------+--------------+ PFV      Full                                                        +---------+---------------+---------+-----------+----------+--------------+ POP      Full           Yes      Yes                                 +---------+---------------+---------+-----------+----------+--------------+ PTV      Full                                                        +---------+---------------+---------+-----------+----------+--------------+ PERO     Full                                                        +---------+---------------+---------+-----------+----------+--------------+     Summary: RIGHT: - There is no evidence of deep vein thrombosis in the lower extremity. However, portions of this examination were limited- see technologist comments above.  - No cystic structure found in the popliteal fossa.  LEFT: - There is no evidence of deep vein thrombosis in the lower extremity. However, portions of this examination were limited- see technologist comments above.  - No cystic structure found in the popliteal fossa.  *See table(s) above for measurements and observations. Electronically signed by Sherald Hess MD on 05/18/2022 at 3:30:20 PM.    Final    DG Chest 2 View  Result Date: 05/17/2022 CLINICAL  DATA:  Edema in the lower extremities EXAM: CHEST - 2 VIEW COMPARISON:  07/11/2017 FINDINGS: Transverse diameter of heart is increased. Pulmonary vascularity is unremarkable. There are no signs of pulmonary edema or focal pulmonary consolidation. There is blunting of both lateral CP angles. There is no pneumothorax. IMPRESSION: Cardiomegaly. There are no signs of pulmonary edema or focal pulmonary consolidation. Small bilateral pleural effusions. Electronically Signed   By: Ernie Avena M.D.   On: 05/17/2022 19:53     Patient Profile     77 year old female with past medical history of hypertension, paroxysmal atrial fibrillation, hyperlipidemia for evaluation of bilateral lower extremity edema question congestive heart failure. Most recent echocardiogram July 2022 showed normal LV function, grade 2 diastolic dysfunction, mild mitral  regurgitation, mild aortic insufficiency. Patient recently in [place] and developed lower extremity edema. Venous Dopplers by report showed thrombus in her left lesser saphenous vein. She was treated with a short course of Lasix with no significant improvement. She states she has had bilateral lower extremity edema that is gradually worsening for 2 months.  Follow-up venous Dopplers this admission showed no DVT.  Follow-up echocardiogram this admission shows ejection fraction 40 to 33%, grade 2 diastolic dysfunction, mild left atrial enlargement, small pericardial effusion, mild mitral regurgitation, trace aortic insufficiency.  Assessment & Plan    1 bilateral lower extremity edema-etiology unclear.  She continues to have significant edema to the point she has difficulty ambulating.  Will give an additional 40 mg of Lasix IV today.  Echocardiogram shows ejection fraction 40 to 45% but I am not convinced this is the cause of her edema (note IVC not dilated on echo suggesting normal RA pressure).  BNP mildly elevated.  However albumin is 1.7 and significant  proteinuria noted on urinalysis.  Also with hyperlipidemia and previous venous Dopplers in [place] by report showed thrombus in her left lesser saphenous vein despite apixaban suggesting hypercoagulable state both consistent with nephrotic syndrome.  Will check  24-hour urine for protein.  Given reduced LV function will check serum and urine immunofixation and urine light chain to screen for amyloid.  Would ask nephrology to evaluate.  She does not have significant diarrhea or other symptoms to suggest protein-losing enteropathy.     2 hypertension-blood pressure has improved.  Continue present medications and follow.  3 cardiomyopathy-LV function mildly reduced on follow-up echocardiogram.  Will discontinue Cardizem and treat with Toprol 25 mg daily.  Continue ARB.  As above, given probable nephrotic syndrome will also screen for amyloid in the setting of reduced LV function.  Check serum and urine immunofixation and urine light chains.  Can consider cardiac CTA to rule out coronary disease as an outpatient once she improves.   4 paroxysmal atrial fibrillation-as above we will discontinue Cardizem in the setting of reduced LV function and treat with Toprol 25 mg daily.  Continue apixaban.   For questions or updates, please contact [place] Please consult [email or web address] for contact info under        Signed, Kirk Ruths, MD  05/19/2022, 8:02 AM

## 2022-05-19 NOTE — Consult Note (Signed)
Rita Myers Admit Date: 05/17/2022 05/19/2022 [name] Requesting Physician:  Fanny Bien MD  Reason for Consult:  LEE, proteinuria, hypoalbuminemia  HPI:  41F PMH including atrial fibrillation, hypertension, hypothyroidism presented to the ED yesterday for 6 weeks of progressive lower extremity edema, exertional fatigue, and weight gain.  She is seen with her daughter in the room.  2 months ago she was in her usual state of health.  Since that time she has had edema in the legs and some bloating across the abdomen.  Workup has included Doppler studies when visiting family in [place] of the lower extremities which reportedly were negative.  Etiology consulted for concern for CHF and LVEF by TTE 40 to 45% but not clearly in decompensated heart failure.  Workup here identified serum albumin of 1.7, UPC of 19, and UA with hematuria and proteinuria.  She has normal GFR.  Hypercholesterolemia noted with total cholesterol 277.  No use of NSAIDs.  No recent antibiotics.  She is originally from [place] but has been in the [place] for years.  Home medications include apixaban for atrial fibrillation, levothyroxine, losartan.  No history of diabetes.  No personal history of malignancy.  No significant fever, chills, sweats.  No unusual rashes, arthralgias, hemoptysis, recurrent epistaxis, sores of the mouth or nose.  Calcium levels are not elevated.  Total protein levels not elevated.  Since admission she has been placed on furosemide 40 mg IV twice daily, losartan has been increased to 50 mg a day.  Blood pressures are normal.  2V CXR on presentation with cardiomegaly but no pulmonary edema.  Small bilateral pleural effusions noted.  I/Os: I/O last 3 completed shifts: In: 3 [I.V.:3] Out: 400 [Urine:400]   ROS NSAIDS: No significant exposure Balance of 12 systems is negative w/ exceptions as above  PMH  Past Medical History:  Diagnosis Date   Aortic insufficiency    Essential  hypertension 07/11/2017   Hyperlipemia    Hypertension    Hypokalemia 07/11/2017   Mitral regurgitation    Nausea, vomiting, and diarrhea 07/11/2017   Paroxysmal A-fib ([place]) 07/11/2017   Pulmonary hypertension ([place])    Syncope, vasovagal 07/11/2017   PSH History reviewed. No pertinent surgical history. FH  Family History  Problem Relation Age of Onset   Diabetes Mother    Diabetes Sister    Diabetes Brother    [place]  reports that she has never smoked. She has never used smokeless tobacco. She reports that she does not drink alcohol and does not use drugs. Allergies No Known Allergies Home medications Prior to Admission medications   Medication Sig Start Date End Date Taking? Authorizing Provider  apixaban (ELIQUIS) 5 MG TABS tablet TAKE 1 TABLET BY MOUTH TWICE A DAY 03/06/22  Yes Belva Crome, MD  B Complex-C (B-COMPLEX WITH VITAMIN C) tablet Take 1 tablet by mouth daily.   Yes [provider]  Calcium-Vitamin D (CALTRATE 600 PLUS-VIT D PO) Take 1 tablet by mouth in the morning and at bedtime.   Yes [provider]  ciprofloxacin-dexamethasone (CIPRODEX) OTIC suspension Place 4 drops into the left ear once a week. 11/13/21  Yes [provider]  gabapentin (NEURONTIN) 300 MG capsule Take 300 mg by mouth 2 (two) times daily.   Yes [provider]  levothyroxine (SYNTHROID) 25 MCG tablet Take 25 mcg by mouth every morning. 04/14/22  Yes [provider]  loratadine (CLARITIN) 10 MG tablet Take 10 mg by mouth daily.   Yes  [provider]  losartan (COZAAR) 50 MG tablet TAKE 1 TABLET BY MOUTH EVERY DAY 12/27/21  Yes Belva Crome, MD  pravastatin (PRAVACHOL) 20 MG tablet Take 20 mg by mouth daily.   Yes [provider]  TIADYLT ER 240 MG 24 hr capsule Take 240 mg by mouth daily. 03/02/22  Yes [provider]    Current Medications Scheduled Meds:  apixaban  5 mg Oral BID   furosemide  40 mg Intravenous BID    gabapentin  300 mg Oral BID   levothyroxine  37.5 mcg Oral q morning   losartan  50 mg Oral Daily   metoprolol succinate  25 mg Oral Daily   [START ON 05/20/2022] rosuvastatin  10 mg Oral Daily   sodium chloride flush  3 mL Intravenous Q12H   Continuous Infusions: PRN Meds:.acetaminophen **OR** acetaminophen, ondansetron **OR** ondansetron (ZOFRAN) IV  CBC Recent Labs  Lab 05/17/22 1927 05/19/22 0055  WBC 7.3 6.7  NEUTROABS 4.1  --   HGB 13.8 11.4*  HCT 42.6 33.1*  MCV 94.5 90.4  PLT 199 010   Basic Metabolic Panel Recent Labs  Lab 05/17/22 1927 05/19/22 0055  NA 137 140  K 3.9 4.2  CL 104 107  CO2 26 27  GLUCOSE 95 97  BUN 23 28*  CREATININE 0.83 0.96  CALCIUM 9.0 8.1*    Physical Exam  Blood pressure 130/70, pulse 79, temperature 98.1 F (36.7 C), temperature source Oral, resp. rate 20, height 4\' 2"  (1.27 m), weight 67.8 kg, SpO2 97 %. GEN: Elderly female, NAD ENT: NCAT EYES: EOMI CV: Regular, normal S1 and S2 PULM: Clear bilaterally, slightly diminished in the bases ABD: Soft, nontender SKIN: No petechia or purpura, no rashes EXT:2-3+ lower extremity edema which is improved from presentation   Assessment 29F with nephrotic syndrome, anasarca, unclear etiology.  Nephrotic syndrome with heavy proteinuria, hypoalbuminemia, hematuria; wide differential including membranous nephropathy, monoclonal process, infection related process Atrial fibrillation on apixaban Hypertension, controlled, on losartan Hyperlipidemia, likely worsened by #1  Plan Needs full serologies: SPEP, SFLC, ANA, dsDNA, C3, C4, HIV, HBV, HCV, ANCA Will need to check phospholipase A2 receptor antibody as outpatient, not available here Likely to need renal biopsy but  might be able to arrange this closely as an outpatient Change Lasix dosing to 60 mg every 8 hours Sodium and fluid restricted diet Cont ARB Daily weights, Daily Renal Panel, Strict I/Os, Avoid nephrotoxins (NSAIDs, judicious  IV Contrast)    [name]  05/19/2022, 12:19 PM

## 2022-05-19 NOTE — Progress Notes (Addendum)
PROGRESS NOTE    Rita Myers  WNI:[id number] DOB: 03-10-1946 DOA: 05/17/2022 PCP: Rita Bussing, MD  77/F with history of paroxysmal A-fib on Eliquis, hypertension, dyslipidemia presented to the ED with progressive lower extremity edema X 1 month, recent shortness of breath and weight gain, recently started on Lasix. -In the ED blood pressure 158/69, creatinine 0.8, albumin 1.7, BNP 283, chest x-ray noted cardiomegaly and small bilateral effusions, UA with 3+ proteinuria -Echo EF 40-45%, RV normal   Subjective: -Legs uncomfortable, some discomfort when walking  Assessment and Plan:  Severe hypoalbuminemia, 1.7 Severe proteinuria, suspect nephrotic syndrome Lower extremity edema -Echo with EF of 40-45% as well -Check SPEP -Nephrology consult -Continue IV Lasix today, continue losartan -Will need to hold apixaban if kidney biopsy recommended  Acute on chronic systolic CHF -Echo with EF down to 40-45% -Continue IV Lasix today, additional workup as above for nephrotic syndrome -Continue losartan -Check SPEP -Monitor urine output, BMP in a.m.  Paroxysmal A-fib -Continue Toprol, apixaban  Hypothyroidism -Continue Synthroid   DVT prophylaxis: Apixaban Code Status: Full code Family Communication: Daughter at bedside Disposition Plan: Home likely 2 to 3 days  Consultants: Cards, nephrology   Procedures:   Antimicrobials:    Objective: Vitals:   05/18/22 2335 05/19/22 0437 05/19/22 0620 05/19/22 0700  BP: 129/67 127/65  124/67  Pulse: 74 63  66  Resp: 16 16  14   Temp: 98.1 F (36.7 C) 98 F (36.7 C)  98 F (36.7 C)  TempSrc: Oral Oral  Oral  SpO2: 98% 97%  97%  Weight:   67.8 kg   Height:   4\' 2"  (1.27 m)     Intake/Output Summary (Last 24 hours) at 05/19/2022 Last data filed at 05/19/2022 0900 Gross per 24 hour  Intake 243 ml  Output 900 ml  Net -657 ml   Filed Weights   05/19/22 0620  Weight: 67.8 kg    Examination:  General exam: Pleasant  female sitting up in bed, AAOx3, no distress HEENT: Positive JVD CVS: S1-S2, regular rhythm Lungs: Decreased breath sounds at the bases Abdomen: Soft, nontender, bowel sounds present Extremities: 2+ edema Skin: No rashes Psychiatry:  Mood & affect appropriate.     Data Reviewed:   CBC: Recent Labs  Lab 05/17/22 1927 05/19/22 0055  WBC 7.3 6.7  NEUTROABS 4.1  --   HGB 13.8 11.4*  HCT 42.6 33.1*  MCV 94.5 90.4  PLT 199 160   Basic Metabolic Panel: Recent Labs  Lab 05/17/22 1927 05/19/22 0055  NA 137 140  K 3.9 4.2  CL 104 107  CO2 26 27  GLUCOSE 95 97  BUN 23 28*  CREATININE 0.83 0.96  CALCIUM 9.0 8.1*  MG  --  1.9   GFR: Estimated Creatinine Clearance: 31 mL/min (by C-G formula based on SCr of 0.96 mg/dL). Liver Function Tests: Recent Labs  Lab 05/17/22 1927  AST 43*  ALT 36  ALKPHOS 50  BILITOT 0.2*  PROT 5.4*  ALBUMIN 1.7*   No results for input(s): "LIPASE", "AMYLASE" in the last 168 hours. No results for input(s): "AMMONIA" in the last 168 hours. Coagulation Profile: No results for input(s): "INR", "PROTIME" in the last 168 hours. Cardiac Enzymes: No results for input(s): "CKTOTAL", "CKMB", "CKMBINDEX", "TROPONINI" in the last 168 hours. BNP (last 3 results) Recent Labs    04/18/22 0941  PROBNP 717   HbA1C: No results for input(s): "HGBA1C" in the last 72 hours. CBG: No results for input(s): "GLUCAP" in the  last 168 hours. Lipid Profile: Recent Labs    05/19/22 0055  CHOL 277*  HDL 77  LDLCALC 167*  TRIG 166*  CHOLHDL 3.6   Thyroid Function Tests: Recent Labs    05/18/22 1315 05/19/22 0055  TSH 5.415*  --   FREET4  --  1.03   Anemia Panel: No results for input(s): "VITAMINB12", "FOLATE", "FERRITIN", "TIBC", "IRON", "RETICCTPCT" in the last 72 hours. Urine analysis:    Component Value Date/Time   COLORURINE YELLOW 05/18/2022 1406   APPEARANCEUR HAZY (A) 05/18/2022 1406   LABSPEC 1.016 05/18/2022 1406   PHURINE 6.0  05/18/2022 1406   GLUCOSEU NEGATIVE 05/18/2022 1406   HGBUR SMALL (A) 05/18/2022 1406   BILIRUBINUR NEGATIVE 05/18/2022 1406   KETONESUR NEGATIVE 05/18/2022 1406   PROTEINUR >=300 (A) 05/18/2022 1406   NITRITE NEGATIVE 05/18/2022 1406   LEUKOCYTESUR NEGATIVE 05/18/2022 1406   Sepsis Labs: @LABRCNTIP (procalcitonin:4,lacticidven:4)  ) Recent Results (from the past 240 hour(s))  MRSA Next Gen by PCR, Nasal     Status: None   Collection Time: 05/19/22  1:23 AM   Specimen: Nasal Mucosa; Nasal Swab  Result Value Ref Range Status   MRSA by PCR Next Gen NOT DETECTED NOT DETECTED Final    Comment: (NOTE) The GeneXpert MRSA Assay (FDA approved for NASAL specimens only), is one component of a comprehensive MRSA colonization surveillance program. It is not intended to diagnose MRSA infection nor to guide or monitor treatment for MRSA infections. Test performance is not FDA approved in patients less than 45 years old. Performed at [facility], [place] [address], [place], [place] [address]      Radiology Studies: ECHOCARDIOGRAM COMPLETE  Result Date: 05/18/2022    ECHOCARDIOGRAM REPORT   Patient Name:   Rita Myers Date of Exam: 05/18/2022 Medical Rec #:  [id number]   Height:       52.0 in Accession #:    [id number]  Weight:       141.0 lb Date of Birth:  1945-06-23  BSA:          1.451 m Patient Age:    77 years    BP:           177/79 mmHg Patient Gender: F           HR:           93 bpm. Exam Location:  Inpatient Procedure: 2D Echo, Cardiac Doppler and Color Doppler Indications:    Dyspnea R06.00  History:        Patient has prior history of Echocardiogram examinations, most                 recent 11/22/2020. Arrythmias:Atrial Fibrillation,                 Signs/Symptoms:Syncope; Risk Factors:Hypertension, Diabetes and                 Dyslipidemia.  Sonographer:    Ronny Flurry Referring Phys: [place]  1. Left ventricular ejection fraction, by estimation, is 40 to  45%. The left ventricle has mildly decreased function. The left ventricle demonstrates global hypokinesis. Left ventricular diastolic parameters are consistent with Grade II diastolic dysfunction (pseudonormalization). Elevated left atrial pressure.  2. Right ventricular systolic function is normal. The right ventricular size is normal. There is mildly elevated pulmonary artery systolic pressure. The estimated right ventricular systolic pressure is 47.4 mmHg.  3. Left atrial size was mildly dilated.  4. A small pericardial effusion  is present.  5. The mitral valve is abnormal. Mild mitral valve regurgitation. Moderate mitral annular calcification.  6. The aortic valve is tricuspid. Aortic valve regurgitation is trivial. Aortic valve sclerosis is present, with no evidence of aortic valve stenosis.  7. The inferior vena cava is normal in size with greater than 50% respiratory variability, suggesting right atrial pressure of 3 mmHg. FINDINGS  Left Ventricle: Left ventricular ejection fraction, by estimation, is 40 to 45%. The left ventricle has mildly decreased function. The left ventricle demonstrates global hypokinesis. The left ventricular internal cavity size was normal in size. There is  no left ventricular hypertrophy. Left ventricular diastolic parameters are consistent with Grade II diastolic dysfunction (pseudonormalization). Elevated left atrial pressure. Right Ventricle: The right ventricular size is normal. No increase in right ventricular wall thickness. Right ventricular systolic function is normal. There is mildly elevated pulmonary artery systolic pressure. The tricuspid regurgitant velocity is 3.18  m/s, and with an assumed right atrial pressure of 3 mmHg, the estimated right ventricular systolic pressure is 43.4 mmHg. Left Atrium: Left atrial size was mildly dilated. Right Atrium: Right atrial size was normal in size. Pericardium: A small pericardial effusion is present. Mitral Valve: The mitral valve  is abnormal. Moderate mitral annular calcification. Mild mitral valve regurgitation. Tricuspid Valve: The tricuspid valve is normal in structure. Tricuspid valve regurgitation is mild. Aortic Valve: The aortic valve is tricuspid. Aortic valve regurgitation is trivial. Aortic regurgitation PHT measures 660 msec. Aortic valve sclerosis is present, with no evidence of aortic valve stenosis. Aortic valve mean gradient measures 5.0 mmHg. Aortic valve peak gradient measures 9.3 mmHg. Aortic valve area, by VTI measures 1.85 cm. Pulmonic Valve: The pulmonic valve was grossly normal. Pulmonic valve regurgitation is trivial. Aorta: The aortic root and ascending aorta are structurally normal, with no evidence of dilitation. Venous: The inferior vena cava is normal in size with greater than 50% respiratory variability, suggesting right atrial pressure of 3 mmHg. IAS/Shunts: The interatrial septum was not well visualized.  LEFT VENTRICLE PLAX 2D LVIDd:         5.20 cm   Diastology LVIDs:         4.10 cm   LV e' medial:    4.13 cm/s LV PW:         0.90 cm   LV E/e' medial:  23.9 LV IVS:        0.90 cm   LV e' lateral:   4.90 cm/s LVOT diam:     1.90 cm   LV E/e' lateral: 20.1 LV SV:         53 LV SV Index:   37 LVOT Area:     2.84 cm  RIGHT VENTRICLE             IVC RV S prime:     18.10 cm/s  IVC diam: 1.20 cm TAPSE (M-mode): 3.0 cm LEFT ATRIUM           Index        RIGHT ATRIUM           Index LA diam:      4.30 cm 2.96 cm/m   RA Area:     13.20 cm LA Vol (A2C): 31.1 ml 21.44 ml/m  RA Volume:   28.30 ml  19.51 ml/m LA Vol (A4C): 73.6 ml 50.74 ml/m  AORTIC VALVE AV Area (Vmax):    1.87 cm AV Area (Vmean):   1.90 cm AV Area (VTI):     1.85  cm AV Vmax:           152.50 cm/s AV Vmean:          105.350 cm/s AV VTI:            0.286 m AV Peak Grad:      9.3 mmHg AV Mean Grad:      5.0 mmHg LVOT Vmax:         100.80 cm/s LVOT Vmean:        70.567 cm/s LVOT VTI:          0.187 m LVOT/AV VTI ratio: 0.65 AI PHT:            660  msec  AORTA Ao Root diam: 2.70 cm Ao Asc diam:  2.30 cm MITRAL VALVE                TRICUSPID VALVE MV Area (PHT): 3.57 cm     TR Peak grad:   40.4 mmHg MV Decel Time: 213 msec     TR Vmax:        318.00 cm/s MR Peak grad: 173.7 mmHg MR Mean grad: 124.0 mmHg    SHUNTS MR Vmax:      659.00 cm/s   Systemic VTI:  0.19 m MR Vmean:     537.0 cm/s    Systemic Diam: 1.90 cm MV E velocity: 98.65 cm/s MV A velocity: 129.50 cm/s MV E/A ratio:  0.76 Oswaldo Milian MD Electronically signed by Oswaldo Milian MD Signature Date/Time: 05/18/2022/3:51:14 PM    Final    VAS [place] LOWER EXTREMITY VENOUS (DVT) (ONLY [facility] & [facility])  Result Date: 05/18/2022  Lower Venous DVT Study Patient Name:  RONA TOMSON  Date of Exam:   05/18/2022 Medical Rec #: [id number]    Accession #:    [id number] Date of Birth: 12-Apr-1946   Patient Gender: F Patient Age:   56 years Exam Location:  [facility] Procedure:      VAS [place] LOWER EXTREMITY VENOUS (DVT) Referring Phys: Ezequiel Essex --------------------------------------------------------------------------------  Indications: Pain.  Risk Factors: None identified. Limitations: Body habitus, poor ultrasound/tissue interface and patient pain tolerance. Comparison Study: 04/18/2022 - BILATERAL:                   - No evidence of deep vein thrombosis seen in the lower                   extremities,                   bilaterally.                   -                   RIGHT:                   - A cystic structure found in the popliteal fossa.                   - 2.2 x .6 x 2.6 cm fluid collection seen in posterior knee                   fossa.                   Moderate suprapatellar fluid seen and superficial edema noted                   in calf.  LEFT:                   - Acomplex cystic structure is found in the popliteal fossa.                   - 6.6 x 4.8 x 5.2 cm complex appearing Baker's cyst. Moderate                   to severe                   suprapatellar fluid noted  with superficial edema in the calf. Performing Technologist: Chanda Busing RVT  Examination Guidelines: A complete evaluation includes B-mode imaging, spectral Doppler, color Doppler, and power Doppler as needed of all accessible portions of each vessel. Bilateral testing is considered an integral part of a complete examination. Limited examinations for reoccurring indications may be performed as noted. The reflux portion of the exam is performed with the patient in reverse Trendelenburg.  +---------+---------------+---------+-----------+----------+--------------+ RIGHT    CompressibilityPhasicitySpontaneityPropertiesThrombus Aging +---------+---------------+---------+-----------+----------+--------------+ CFV      Full           Yes      Yes                                 +---------+---------------+---------+-----------+----------+--------------+ SFJ      Full                                                        +---------+---------------+---------+-----------+----------+--------------+ FV Prox  Full                                                        +---------+---------------+---------+-----------+----------+--------------+ FV Mid   Full                                                        +---------+---------------+---------+-----------+----------+--------------+ FV DistalFull                                                        +---------+---------------+---------+-----------+----------+--------------+ PFV      Full                                                        +---------+---------------+---------+-----------+----------+--------------+ POP      Full           Yes      Yes                                 +---------+---------------+---------+-----------+----------+--------------+  PTV      Full                                                        +---------+---------------+---------+-----------+----------+--------------+ PERO      Full                                                        +---------+---------------+---------+-----------+----------+--------------+   +---------+---------------+---------+-----------+----------+--------------+ LEFT     CompressibilityPhasicitySpontaneityPropertiesThrombus Aging +---------+---------------+---------+-----------+----------+--------------+ CFV      Full           Yes      Yes                                 +---------+---------------+---------+-----------+----------+--------------+ SFJ      Full                                                        +---------+---------------+---------+-----------+----------+--------------+ FV Prox  Full                                                        +---------+---------------+---------+-----------+----------+--------------+ FV Mid                  Yes      Yes                                 +---------+---------------+---------+-----------+----------+--------------+ FV Distal               Yes      Yes                                 +---------+---------------+---------+-----------+----------+--------------+ PFV      Full                                                        +---------+---------------+---------+-----------+----------+--------------+ POP      Full           Yes      Yes                                 +---------+---------------+---------+-----------+----------+--------------+ PTV      Full                                                        +---------+---------------+---------+-----------+----------+--------------+  PERO     Full                                                        +---------+---------------+---------+-----------+----------+--------------+     Summary: RIGHT: - There is no evidence of deep vein thrombosis in the lower extremity. However, portions of this examination were limited- see technologist comments above.  - No cystic structure found in  the popliteal fossa.  LEFT: - There is no evidence of deep vein thrombosis in the lower extremity. However, portions of this examination were limited- see technologist comments above.  - No cystic structure found in the popliteal fossa.  *See table(s) above for measurements and observations. Electronically signed by Sherald Hess MD on 05/18/2022 at 3:30:20 PM.    Final    DG Chest 2 View  Result Date: 05/17/2022 CLINICAL DATA:  Edema in the lower extremities EXAM: CHEST - 2 VIEW COMPARISON:  07/11/2017 FINDINGS: Transverse diameter of heart is increased. Pulmonary vascularity is unremarkable. There are no signs of pulmonary edema or focal pulmonary consolidation. There is blunting of both lateral CP angles. There is no pneumothorax. IMPRESSION: Cardiomegaly. There are no signs of pulmonary edema or focal pulmonary consolidation. Small bilateral pleural effusions. Electronically Signed   By: Ernie Avena M.D.   On: 05/17/2022 19:53     Scheduled Meds:  apixaban  5 mg Oral BID   [START ON 05/20/2022] furosemide  40 mg Intravenous Daily   gabapentin  300 mg Oral BID   levothyroxine  37.5 mcg Oral q morning   losartan  50 mg Oral Daily   metoprolol succinate  25 mg Oral Daily   pravastatin  20 mg Oral Daily   sodium chloride flush  3 mL Intravenous Q12H   Continuous Infusions:   LOS: 1 day    Time spent:    Zannie Cove, MD [facility]   05/19/2022, 9:25 AM

## 2022-05-20 DIAGNOSIS — N049 Nephrotic syndrome with unspecified morphologic changes: Secondary | ICD-10-CM

## 2022-05-20 DIAGNOSIS — E8809 Other disorders of plasma-protein metabolism, not elsewhere classified: Secondary | ICD-10-CM

## 2022-05-20 DIAGNOSIS — M7989 Other specified soft tissue disorders: Secondary | ICD-10-CM | POA: Diagnosis not present

## 2022-05-20 DIAGNOSIS — I429 Cardiomyopathy, unspecified: Secondary | ICD-10-CM

## 2022-05-20 DIAGNOSIS — R600 Localized edema: Principal | ICD-10-CM

## 2022-05-20 DIAGNOSIS — I480 Paroxysmal atrial fibrillation: Secondary | ICD-10-CM | POA: Diagnosis not present

## 2022-05-20 LAB — COMPREHENSIVE METABOLIC PANEL
ALT: 24 U/L (ref 0–44)
AST: 30 U/L (ref 15–41)
Albumin: <1.5 g/dL — ABNORMAL LOW (ref 3.5–5.0)
Alkaline Phosphatase: 39 U/L (ref 38–126)
Anion gap: 4 — ABNORMAL LOW (ref 5–15)
BUN: 34 mg/dL — ABNORMAL HIGH (ref 8–23)
CO2: 28 mmol/L (ref 22–32)
Calcium: 8 mg/dL — ABNORMAL LOW (ref 8.9–10.3)
Chloride: 105 mmol/L (ref 98–111)
Creatinine, Ser: 1 mg/dL (ref 0.44–1.00)
GFR, Estimated: 58 mL/min — ABNORMAL LOW (ref >60)
Glucose, Bld: 120 mg/dL — ABNORMAL HIGH (ref 70–99)
Potassium: 4 mmol/L (ref 3.5–5.1)
Sodium: 137 mmol/L (ref 135–145)
Total Bilirubin: <0.1 mg/dL — ABNORMAL LOW (ref 0.3–1.2)
Total Protein: 4.4 g/dL — ABNORMAL LOW (ref 6.5–8.1)

## 2022-05-20 LAB — HCV AB W REFLEX TO QUANT PCR: HCV Ab: NONREACTIVE

## 2022-05-20 LAB — C3 COMPLEMENT: C3 Complement: 157 mg/dL (ref 82–167)

## 2022-05-20 LAB — HCV INTERPRETATION

## 2022-05-20 LAB — C4 COMPLEMENT: Complement C4, Body Fluid: 33 mg/dL (ref 12–38)

## 2022-05-20 MED ORDER — FUROSEMIDE 10 MG/ML IJ SOLN
60.0000 mg | Freq: Four times a day (QID) | INTRAMUSCULAR | Status: DC
  Administered 2022-05-20 – 2022-05-21 (×4): 60 mg via INTRAVENOUS
  Filled 2022-05-20 (×4): qty 6

## 2022-05-20 MED ORDER — ALBUMIN HUMAN 25 % IV SOLN
25.0000 g | Freq: Four times a day (QID) | INTRAVENOUS | Status: AC
  Administered 2022-05-20 (×2): 12.5 g via INTRAVENOUS
  Filled 2022-05-20 (×2): qty 100

## 2022-05-20 NOTE — Progress Notes (Signed)
Admit: 05/17/2022 LOS: 2  52F with nephrotic syndrome, anasarca, unclear etiology.   Subjective:  Pain in L leg antlateral shin Diuresing well, weights not really improved though SAlb < 1.5  Serologies thus far negative, below UP/C 5.2  01/05 0701 - 01/06 0700 In: 960 [P.O.:960] Out: 2200 [Urine:2200]  Filed Weights   05/19/22 0620 05/20/22 0609  Weight: 67.8 kg 67.5 kg    Scheduled Meds:  apixaban  5 mg Oral BID   furosemide  60 mg Intravenous Q8H   gabapentin  300 mg Oral BID   levothyroxine  37.5 mcg Oral q morning   losartan  50 mg Oral Daily   metoprolol succinate  25 mg Oral Daily   rosuvastatin  10 mg Oral Daily   sodium chloride flush  3 mL Intravenous Q12H   Continuous Infusions:  albumin human 12.5 g (05/20/22 0814)   PRN Meds:.acetaminophen **OR** acetaminophen, ondansetron **OR** ondansetron (ZOFRAN) IV  Current Labs: reviewed   Latest Reference Range & Units 05/19/22 13:15  Protein Creatinine Ratio 0.00 - 0.15 mg/mgCre 5.19 (H)    Latest Reference Range & Units 05/19/22 13:17  C3 Complement 82 - 167 mg/dL 157  Complement C4, Body Fluid 12 - 38 mg/dL 33  Hepatitis B Surface Ag NON REACTIVE  NON REACTIVE  HCV Ab Non Reactive  Non Reactive  HCV Interp 1:  Comment  HIV Screen 4th Generation wRfx Non Reactive  Non Reactive   PENDING: SPEP, SFLC, ANA, ANCA  Physical Exam:  Blood pressure 132/65, pulse 77, temperature 98 F (36.7 C), temperature source Oral, resp. rate (!) 22, height 4\' 2"  (1.27 m), weight 67.5 kg, SpO2 99 %. GEN: Elderly female, NAD ENT: NCAT EYES: EOMI CV: Regular, normal S1 and S2 PULM: Clear bilaterally, slightly diminished in the bases ABD: Soft, nontender SKIN: No petechia or purpura, no rashes EXT:2-3+ about the same as yesterday  A Nephrotic syndrome with heavy proteinuria, hypoalbuminemia, hematuria; wide differential including membranous nephropathy, monoclonal process, infection related process Atrial fibrillation on  apixaban Hypertension, controlled, on losartan Hyperlipidemia, likely worsened by #1  P Await additional serologies Discussed staying for renal biopsy on 1/8, will see if this is feasible with IR Will need to come off apixaban for renal biopsy if to be done on Monday Continue diuretics for another 24 hours, chang to every 6 hours Medication Issues; Preferred narcotic agents for pain control are hydromorphone, fentanyl, and methadone. Morphine should not be used.  Baclofen should be avoided Avoid oral sodium phosphate and magnesium citrate based laxatives / bowel preps    Pearson Grippe MD 05/20/2022, 9:37 AM  Recent Labs  Lab 05/17/22 1927 05/19/22 0055 05/20/22 0033  NA 137 140 137  K 3.9 4.2 4.0  CL 104 107 105  CO2 26 27 28   GLUCOSE 95 97 120*  BUN 23 28* 34*  CREATININE 0.83 0.96 1.00  CALCIUM 9.0 8.1* 8.0*   Recent Labs  Lab 05/17/22 1927 05/19/22 0055  WBC 7.3 6.7  NEUTROABS 4.1  --   HGB 13.8 11.4*  HCT 42.6 33.1*  MCV 94.5 90.4  PLT 199 160

## 2022-05-20 NOTE — Progress Notes (Signed)
PROGRESS NOTE    Rita Myers  [id number] DOB: 11-May-1946 DOA: 05/17/2022 PCP: Rita Myers  76/F with history of paroxysmal A-fib on Eliquis, hypertension, dyslipidemia presented to the ED with progressive lower extremity edema X 1 month, recent shortness of breath and weight gain, recently started on Lasix. -In the ED blood pressure 158/69, creatinine 0.8, albumin 1.7, BNP 283, chest x-ray noted cardiomegaly and small bilateral effusions, UA with 3+ proteinuria -Echo EF 40-45%, RV normal   Subjective: -Still continues to have some discomfort in her left leg  Assessment and Plan:  Nephrotic syndrome, new diagnosis with profound proteinuria, hypoalbuminemia -Echo with EF of 40-45% as well -Appreciate nephrology input, await serologies including SPEP and myeloma panel -Continue IV Lasix, add albumin to augment diuresis today, continue losartan -Hold apixaban for possible kidney biopsy -Ambulate, PT eval  Acute on chronic systolic CHF -Echo with EF down to 40-45% -Continue IV Lasix today, additional workup as above for nephrotic syndrome -Continue losartan -FU SPEP -Monitor urine output, BMP in a.m.  Paroxysmal A-fib -Continue Toprol, hold apixaban  Hypothyroidism -Continue Synthroid   DVT prophylaxis: Holding apixaban Code Status: Full code Family Communication: Spouse at bedside Disposition Plan: Home likely 2 to 3 days  Consultants: Cards, nephrology   Procedures:   Antimicrobials:    Objective: Vitals:   05/19/22 2324 05/20/22 0607 05/20/22 0609 05/20/22 0718  BP: 139/65 123/66  132/65  Pulse: 82 77  77  Resp: 17 15  (!) 22  Temp: 97.7 F (36.5 C) 98.6 F (37 C)  98 F (36.7 C)  TempSrc: Oral Oral  Oral  SpO2: 98% 97%  99%  Weight:   67.5 kg   Height:        Intake/Output Summary (Last 24 hours) at 05/20/2022 1046 Last data filed at 05/20/2022 0800 Gross per 24 hour  Intake 960 ml  Output 1700 ml  Net -740 ml   Filed Weights   05/19/22  0620 05/20/22 0609  Weight: 67.8 kg 67.5 kg    Examination:  General exam: Pleasant female sitting up in bed, AAOx3, no distress HEENT: Positive JVD CVS: S1-S2, regular rhythm Lungs: Decreased breath sounds at the bases Abdomen: Soft, nontender, bowel sounds present Extremities: 2+ edema Skin: No rashes Psychiatry:  Mood & affect appropriate.     Data Reviewed:   CBC: Recent Labs  Lab 05/17/22 1927 05/19/22 0055  WBC 7.3 6.7  NEUTROABS 4.1  --   HGB 13.8 11.4*  HCT 42.6 33.1*  MCV 94.5 90.4  PLT 199 0000000   Basic Metabolic Panel: Recent Labs  Lab 05/17/22 1927 05/19/22 0055 05/20/22 0033  NA 137 140 137  K 3.9 4.2 4.0  CL 104 107 105  CO2 26 27 28   GLUCOSE 95 97 120*  BUN 23 28* 34*  CREATININE 0.83 0.96 1.00  CALCIUM 9.0 8.1* 8.0*  MG  --  1.9  --    GFR: Estimated Creatinine Clearance: 29.6 mL/min (by C-G formula based on SCr of 1 mg/dL). Liver Function Tests: Recent Labs  Lab 05/17/22 1927 05/20/22 0033  AST 43* 30  ALT 36 24  ALKPHOS 50 39  BILITOT 0.2* <0.1*  PROT 5.4* 4.4*  ALBUMIN 1.7* <1.5*   No results for input(s): "LIPASE", "AMYLASE" in the last 168 hours. No results for input(s): "AMMONIA" in the last 168 hours. Coagulation Profile: No results for input(s): "INR", "PROTIME" in the last 168 hours. Cardiac Enzymes: No results for input(s): "CKTOTAL", "CKMB", "CKMBINDEX", "TROPONINI" in the last  168 hours. BNP (last 3 results) Recent Labs    04/18/22 0941  PROBNP 717   HbA1C: Recent Labs    05/19/22 0055  HGBA1C 6.1*   CBG: No results for input(s): "GLUCAP" in the last 168 hours. Lipid Profile: Recent Labs    05/19/22 0055  CHOL 277*  HDL 77  LDLCALC 167*  TRIG 166*  CHOLHDL 3.6   Thyroid Function Tests: Recent Labs    05/18/22 1315 05/19/22 0055  TSH 5.415*  --   FREET4  --  1.03   Anemia Panel: No results for input(s): "VITAMINB12", "FOLATE", "FERRITIN", "TIBC", "IRON", "RETICCTPCT" in the last 72  hours. Urine analysis:    Component Value Date/Time   COLORURINE YELLOW 05/18/2022 1406   APPEARANCEUR HAZY (A) 05/18/2022 1406   LABSPEC 1.016 05/18/2022 1406   PHURINE 6.0 05/18/2022 1406   GLUCOSEU NEGATIVE 05/18/2022 1406   HGBUR SMALL (A) 05/18/2022 1406   BILIRUBINUR NEGATIVE 05/18/2022 1406   KETONESUR NEGATIVE 05/18/2022 1406   PROTEINUR >=300 (A) 05/18/2022 1406   NITRITE NEGATIVE 05/18/2022 1406   LEUKOCYTESUR NEGATIVE 05/18/2022 1406   Sepsis Labs: @LABRCNTIP (procalcitonin:4,lacticidven:4)  ) Recent Results (from the past 240 hour(s))  MRSA Next Gen by PCR, Nasal     Status: None   Collection Time: 05/19/22  1:23 AM   Specimen: Nasal Mucosa; Nasal Swab  Result Value Ref Range Status   MRSA by PCR Next Gen NOT DETECTED NOT DETECTED Final    Comment: (NOTE) The GeneXpert MRSA Assay (FDA approved for NASAL specimens only), is one component of a comprehensive MRSA colonization surveillance program. It is not intended to diagnose MRSA infection nor to guide or monitor treatment for MRSA infections. Test performance is not FDA approved in patients less than 102 years old. Performed at [facility], [place] [address], [place], [place] [address]      Radiology Studies: ECHOCARDIOGRAM COMPLETE  Result Date: 05/18/2022    ECHOCARDIOGRAM REPORT   Patient Name:   Rita Myers Date of Exam: 05/18/2022 Medical Rec #:  [id number]   Height:       52.0 in Accession #:    [id number]  Weight:       141.0 lb Date of Birth:  28-Jan-1946  BSA:          1.451 m Patient Age:    78 years    BP:           177/79 mmHg Patient Gender: F           HR:           93 bpm. Exam Location:  Inpatient Procedure: 2D Echo, Cardiac Doppler and Color Doppler Indications:    Dyspnea R06.00  History:        Patient has prior history of Echocardiogram examinations, most                 recent 11/22/2020. Arrythmias:Atrial Fibrillation,                 Signs/Symptoms:Syncope; Risk Factors:Hypertension, Diabetes  and                 Dyslipidemia.  Sonographer:    Ronny Flurry Referring Phys: Rita Myers  1. Left ventricular ejection fraction, by estimation, is 40 to 45%. The left ventricle has mildly decreased function. The left ventricle demonstrates global hypokinesis. Left ventricular diastolic parameters are consistent with Grade II diastolic dysfunction (pseudonormalization). Elevated left atrial pressure.  2. Right ventricular systolic function  is normal. The right ventricular size is normal. There is mildly elevated pulmonary artery systolic pressure. The estimated right ventricular systolic pressure is 43.4 mmHg.  3. Left atrial size was mildly dilated.  4. A small pericardial effusion is present.  5. The mitral valve is abnormal. Mild mitral valve regurgitation. Moderate mitral annular calcification.  6. The aortic valve is tricuspid. Aortic valve regurgitation is trivial. Aortic valve sclerosis is present, with no evidence of aortic valve stenosis.  7. The inferior vena cava is normal in size with greater than 50% respiratory variability, suggesting right atrial pressure of 3 mmHg. FINDINGS  Left Ventricle: Left ventricular ejection fraction, by estimation, is 40 to 45%. The left ventricle has mildly decreased function. The left ventricle demonstrates global hypokinesis. The left ventricular internal cavity size was normal in size. There is  no left ventricular hypertrophy. Left ventricular diastolic parameters are consistent with Grade II diastolic dysfunction (pseudonormalization). Elevated left atrial pressure. Right Ventricle: The right ventricular size is normal. No increase in right ventricular wall thickness. Right ventricular systolic function is normal. There is mildly elevated pulmonary artery systolic pressure. The tricuspid regurgitant velocity is 3.18  m/s, and with an assumed right atrial pressure of 3 mmHg, the estimated right ventricular systolic pressure is 43.4 mmHg. Left  Atrium: Left atrial size was mildly dilated. Right Atrium: Right atrial size was normal in size. Pericardium: A small pericardial effusion is present. Mitral Valve: The mitral valve is abnormal. Moderate mitral annular calcification. Mild mitral valve regurgitation. Tricuspid Valve: The tricuspid valve is normal in structure. Tricuspid valve regurgitation is mild. Aortic Valve: The aortic valve is tricuspid. Aortic valve regurgitation is trivial. Aortic regurgitation PHT measures 660 msec. Aortic valve sclerosis is present, with no evidence of aortic valve stenosis. Aortic valve mean gradient measures 5.0 mmHg. Aortic valve peak gradient measures 9.3 mmHg. Aortic valve area, by VTI measures 1.85 cm. Pulmonic Valve: The pulmonic valve was grossly normal. Pulmonic valve regurgitation is trivial. Aorta: The aortic root and ascending aorta are structurally normal, with no evidence of dilitation. Venous: The inferior vena cava is normal in size with greater than 50% respiratory variability, suggesting right atrial pressure of 3 mmHg. IAS/Shunts: The interatrial septum was not well visualized.  LEFT VENTRICLE PLAX 2D LVIDd:         5.20 cm   Diastology LVIDs:         4.10 cm   LV e' medial:    4.13 cm/s LV PW:         0.90 cm   LV E/e' medial:  23.9 LV IVS:        0.90 cm   LV e' lateral:   4.90 cm/s LVOT diam:     1.90 cm   LV E/e' lateral: 20.1 LV SV:         53 LV SV Index:   37 LVOT Area:     2.84 cm  RIGHT VENTRICLE             IVC RV S prime:     18.10 cm/s  IVC diam: 1.20 cm TAPSE (M-mode): 3.0 cm LEFT ATRIUM           Index        RIGHT ATRIUM           Index LA diam:      4.30 cm 2.96 cm/m   RA Area:     13.20 cm LA Vol (A2C): 31.1 ml 21.44 ml/m  RA Volume:  28.30 ml  19.51 ml/m LA Vol (A4C): 73.6 ml 50.74 ml/m  AORTIC VALVE AV Area (Vmax):    1.87 cm AV Area (Vmean):   1.90 cm AV Area (VTI):     1.85 cm AV Vmax:           152.50 cm/s AV Vmean:          105.350 cm/s AV VTI:            0.286 m AV Peak  Grad:      9.3 mmHg AV Mean Grad:      5.0 mmHg LVOT Vmax:         100.80 cm/s LVOT Vmean:        70.567 cm/s LVOT VTI:          0.187 m LVOT/AV VTI ratio: 0.65 AI PHT:            660 msec  AORTA Ao Root diam: 2.70 cm Ao Asc diam:  2.30 cm MITRAL VALVE                TRICUSPID VALVE MV Area (PHT): 3.57 cm     TR Peak grad:   40.4 mmHg MV Decel Time: 213 msec     TR Vmax:        318.00 cm/s MR Peak grad: 173.7 mmHg MR Mean grad: 124.0 mmHg    SHUNTS MR Vmax:      659.00 cm/s   Systemic VTI:  0.19 m MR Vmean:     537.0 cm/s    Systemic Diam: 1.90 cm MV E velocity: 98.65 cm/s MV A velocity: 129.50 cm/s MV E/A ratio:  0.76 Oswaldo Milian Myers Electronically signed by Oswaldo Milian Myers Signature Date/Time: 05/18/2022/3:51:14 PM    Final    VAS [place] LOWER EXTREMITY VENOUS (DVT) (ONLY [facility] & [facility])  Result Date: 05/18/2022  Lower Venous DVT Study Patient Name:  LYLLA MELNIKOV  Date of Exam:   05/18/2022 Medical Rec #: [id number]    Accession #:    [id number] Date of Birth: May 10, 1946   Patient Gender: F Patient Age:   60 years Exam Location:  [facility] Procedure:      VAS [place] LOWER EXTREMITY VENOUS (DVT) Referring Phys: Ezequiel Essex --------------------------------------------------------------------------------  Indications: Pain.  Risk Factors: None identified. Limitations: Body habitus, poor ultrasound/tissue interface and patient pain tolerance. Comparison Study: 04/18/2022 - BILATERAL:                   - No evidence of deep vein thrombosis seen in the lower                   extremities,                   bilaterally.                   -                   RIGHT:                   - A cystic structure found in the popliteal fossa.                   - 2.2 x .6 x 2.6 cm fluid collection seen in posterior knee                   fossa.  Moderate suprapatellar fluid seen and superficial edema noted                   in calf.                    LEFT:                   - Acomplex cystic structure  is found in the popliteal fossa.                   - 6.6 x 4.8 x 5.2 cm complex appearing Baker's cyst. Moderate                   to severe                   suprapatellar fluid noted with superficial edema in the calf. Performing Technologist: Oliver Hum RVT  Examination Guidelines: A complete evaluation includes B-mode imaging, spectral Doppler, color Doppler, and power Doppler as needed of all accessible portions of each vessel. Bilateral testing is considered an integral part of a complete examination. Limited examinations for reoccurring indications may be performed as noted. The reflux portion of the exam is performed with the patient in reverse Trendelenburg.  +---------+---------------+---------+-----------+----------+--------------+ RIGHT    CompressibilityPhasicitySpontaneityPropertiesThrombus Aging +---------+---------------+---------+-----------+----------+--------------+ CFV      Full           Yes      Yes                                 +---------+---------------+---------+-----------+----------+--------------+ SFJ      Full                                                        +---------+---------------+---------+-----------+----------+--------------+ FV Prox  Full                                                        +---------+---------------+---------+-----------+----------+--------------+ FV Mid   Full                                                        +---------+---------------+---------+-----------+----------+--------------+ FV DistalFull                                                        +---------+---------------+---------+-----------+----------+--------------+ PFV      Full                                                        +---------+---------------+---------+-----------+----------+--------------+ POP      Full  Yes      Yes                                  +---------+---------------+---------+-----------+----------+--------------+ PTV      Full                                                        +---------+---------------+---------+-----------+----------+--------------+ PERO     Full                                                        +---------+---------------+---------+-----------+----------+--------------+   +---------+---------------+---------+-----------+----------+--------------+ LEFT     CompressibilityPhasicitySpontaneityPropertiesThrombus Aging +---------+---------------+---------+-----------+----------+--------------+ CFV      Full           Yes      Yes                                 +---------+---------------+---------+-----------+----------+--------------+ SFJ      Full                                                        +---------+---------------+---------+-----------+----------+--------------+ FV Prox  Full                                                        +---------+---------------+---------+-----------+----------+--------------+ FV Mid                  Yes      Yes                                 +---------+---------------+---------+-----------+----------+--------------+ FV Distal               Yes      Yes                                 +---------+---------------+---------+-----------+----------+--------------+ PFV      Full                                                        +---------+---------------+---------+-----------+----------+--------------+ POP      Full           Yes      Yes                                 +---------+---------------+---------+-----------+----------+--------------+ PTV      Full                                                        +---------+---------------+---------+-----------+----------+--------------+  PERO     Full                                                         +---------+---------------+---------+-----------+----------+--------------+     Summary: RIGHT: - There is no evidence of deep vein thrombosis in the lower extremity. However, portions of this examination were limited- see technologist comments above.  - No cystic structure found in the popliteal fossa.  LEFT: - There is no evidence of deep vein thrombosis in the lower extremity. However, portions of this examination were limited- see technologist comments above.  - No cystic structure found in the popliteal fossa.  *See table(s) above for measurements and observations. Electronically signed by Sherald Hess Myers on 05/18/2022 at 3:30:20 PM.    Final      Scheduled Meds:  furosemide  60 mg Intravenous Q6H   gabapentin  300 mg Oral BID   levothyroxine  37.5 mcg Oral q morning   losartan  50 mg Oral Daily   metoprolol succinate  25 mg Oral Daily   rosuvastatin  10 mg Oral Daily   sodium chloride flush  3 mL Intravenous Q12H   Continuous Infusions:  albumin human 12.5 g (05/20/22 0814)     LOS: 2 days    Time spent:    Zannie Cove, Myers [facility]   05/20/2022, 10:46 AM

## 2022-05-20 NOTE — Progress Notes (Signed)
Rounding Note    Patient Name: Rita Myers Date of Encounter: 05/20/2022  [facility] Cardiologist: Lyn Records III, MD   Subjective   No acute events overnight. Daughter at bedside; reviewed echo findings, thought process on possible etiology, change in medications. All questions answered. As Dr. Katrinka Blazing has retired, they would like to see either Dr. Jens Som or myself post discharge for continuity of care. Patient is having some diffuse LLE pain but otherwise feels that she is improving.  Inpatient Medications    Scheduled Meds:  furosemide  60 mg Intravenous Q6H   gabapentin  300 mg Oral BID   levothyroxine  37.5 mcg Oral q morning   losartan  50 mg Oral Daily   metoprolol succinate  25 mg Oral Daily   rosuvastatin  10 mg Oral Daily   sodium chloride flush  3 mL Intravenous Q12H   Continuous Infusions:  albumin human 12.5 g (05/20/22 0814)   PRN Meds: acetaminophen **OR** acetaminophen, ondansetron **OR** ondansetron (ZOFRAN) IV   Vital Signs    Vitals:   05/20/22 0607 05/20/22 0609 05/20/22 0718 05/20/22 1121  BP: 123/66  132/65 (!) 142/75  Pulse: 77  77 87  Resp: 15  (!) 22 16  Temp: 98.6 F (37 C)  98 F (36.7 C) 97.7 F (36.5 C)  TempSrc: Oral  Oral Oral  SpO2: 97%  99% 97%  Weight:  67.5 kg    Height:        Intake/Output Summary (Last 24 hours) at 05/20/2022 1138 Last data filed at 05/20/2022 0800 Gross per 24 hour  Intake 960 ml  Output 1700 ml  Net -740 ml      05/20/2022    6:09 AM 05/19/2022    6:20 AM 04/18/2022    8:26 AM  Last 3 Weights  Weight (lbs) 148 lb 13 oz 149 lb 7.6 oz 141 lb  Weight (kg) 67.5 kg 67.8 kg 63.957 kg      Telemetry    NSR- Personally Reviewed  Physical Exam   GEN: Well nourished, well developed in no acute distress NECK: No JVD CARDIAC: regular rhythm, normal S1 and S2, no rubs or gallops. No murmur. VASCULAR: Radial pulses 2+ bilaterally.  RESPIRATORY:  Clear to auscultation without rales, wheezing or  rhonchi  ABDOMEN: Soft, non-tender, non-distended MUSCULOSKELETAL:  Moves all 4 limbs independently SKIN: Warm and dry, 1+ bilateral LE edema NEUROLOGIC:  No focal neuro deficits noted. PSYCHIATRIC:  Normal affect    Labs    High Sensitivity Troponin:   Recent Labs  Lab 05/18/22 1134 05/18/22 1406  TROPONINIHS 12 14     Chemistry Recent Labs  Lab 05/17/22 1927 05/19/22 0055 05/20/22 0033  NA 137 140 137  K 3.9 4.2 4.0  CL 104 107 105  CO2 26 27 28   GLUCOSE 95 97 120*  BUN 23 28* 34*  CREATININE 0.83 0.96 1.00  CALCIUM 9.0 8.1* 8.0*  MG  --  1.9  --   PROT 5.4*  --  4.4*  ALBUMIN 1.7*  --  <1.5*  AST 43*  --  30  ALT 36  --  24  ALKPHOS 50  --  39  BILITOT 0.2*  --  <0.1*  GFRNONAA >60 >60 58*  ANIONGAP 7 6 4*    Lipids  Recent Labs  Lab 05/19/22 0055  CHOL 277*  TRIG 166*  HDL 77  LDLCALC 167*  CHOLHDL 3.6    Hematology Recent Labs  Lab 05/17/22  1927 05/19/22 0055  WBC 7.3 6.7  RBC 4.51 3.66*  HGB 13.8 11.4*  HCT 42.6 33.1*  MCV 94.5 90.4  MCH 30.6 31.1  MCHC 32.4 34.4  RDW 12.7 12.8  PLT 199 160   Thyroid  Recent Labs  Lab 05/18/22 1315 05/19/22 0055  TSH 5.415*  --   FREET4  --  1.03    BNP Recent Labs  Lab 05/17/22 1927  BNP 283.2*     Radiology    ECHOCARDIOGRAM COMPLETE  Result Date: 05/18/2022    ECHOCARDIOGRAM REPORT   Patient Name:   Rita Myers Date of Exam: 05/18/2022 Medical Rec #:  [id number]   Height:       52.0 in Accession #:    [id number]  Weight:       141.0 lb Date of Birth:  1945/12/20  BSA:          1.451 m Patient Age:    76 years    BP:           177/79 mmHg Patient Gender: F           HR:           93 bpm. Exam Location:  Inpatient Procedure: 2D Echo, Cardiac Doppler and Color Doppler Indications:    Dyspnea R06.00  History:        Patient has prior history of Echocardiogram examinations, most                 recent 11/22/2020. Arrythmias:Atrial Fibrillation,                 Signs/Symptoms:Syncope; Risk  Factors:Hypertension, Diabetes and                 Dyslipidemia.  Sonographer:    Lucendia Herrlich Referring Phys: [phone] STEPHEN RANCOUR IMPRESSIONS  1. Left ventricular ejection fraction, by estimation, is 40 to 45%. The left ventricle has mildly decreased function. The left ventricle demonstrates global hypokinesis. Left ventricular diastolic parameters are consistent with Grade II diastolic dysfunction (pseudonormalization). Elevated left atrial pressure.  2. Right ventricular systolic function is normal. The right ventricular size is normal. There is mildly elevated pulmonary artery systolic pressure. The estimated right ventricular systolic pressure is 43.4 mmHg.  3. Left atrial size was mildly dilated.  4. A small pericardial effusion is present.  5. The mitral valve is abnormal. Mild mitral valve regurgitation. Moderate mitral annular calcification.  6. The aortic valve is tricuspid. Aortic valve regurgitation is trivial. Aortic valve sclerosis is present, with no evidence of aortic valve stenosis.  7. The inferior vena cava is normal in size with greater than 50% respiratory variability, suggesting right atrial pressure of 3 mmHg. FINDINGS  Left Ventricle: Left ventricular ejection fraction, by estimation, is 40 to 45%. The left ventricle has mildly decreased function. The left ventricle demonstrates global hypokinesis. The left ventricular internal cavity size was normal in size. There is  no left ventricular hypertrophy. Left ventricular diastolic parameters are consistent with Grade II diastolic dysfunction (pseudonormalization). Elevated left atrial pressure. Right Ventricle: The right ventricular size is normal. No increase in right ventricular wall thickness. Right ventricular systolic function is normal. There is mildly elevated pulmonary artery systolic pressure. The tricuspid regurgitant velocity is 3.18  m/s, and with an assumed right atrial pressure of 3 mmHg, the estimated right ventricular  systolic pressure is 43.4 mmHg. Left Atrium: Left atrial size was mildly dilated. Right Atrium: Right atrial size was normal in size. Pericardium: A small  pericardial effusion is present. Mitral Valve: The mitral valve is abnormal. Moderate mitral annular calcification. Mild mitral valve regurgitation. Tricuspid Valve: The tricuspid valve is normal in structure. Tricuspid valve regurgitation is mild. Aortic Valve: The aortic valve is tricuspid. Aortic valve regurgitation is trivial. Aortic regurgitation PHT measures 660 msec. Aortic valve sclerosis is present, with no evidence of aortic valve stenosis. Aortic valve mean gradient measures 5.0 mmHg. Aortic valve peak gradient measures 9.3 mmHg. Aortic valve area, by VTI measures 1.85 cm. Pulmonic Valve: The pulmonic valve was grossly normal. Pulmonic valve regurgitation is trivial. Aorta: The aortic root and ascending aorta are structurally normal, with no evidence of dilitation. Venous: The inferior vena cava is normal in size with greater than 50% respiratory variability, suggesting right atrial pressure of 3 mmHg. IAS/Shunts: The interatrial septum was not well visualized.  LEFT VENTRICLE PLAX 2D LVIDd:         5.20 cm   Diastology LVIDs:         4.10 cm   LV e' medial:    4.13 cm/s LV PW:         0.90 cm   LV E/e' medial:  23.9 LV IVS:        0.90 cm   LV e' lateral:   4.90 cm/s LVOT diam:     1.90 cm   LV E/e' lateral: 20.1 LV SV:         53 LV SV Index:   37 LVOT Area:     2.84 cm  RIGHT VENTRICLE             IVC RV S prime:     18.10 cm/s  IVC diam: 1.20 cm TAPSE (M-mode): 3.0 cm LEFT ATRIUM           Index        RIGHT ATRIUM           Index LA diam:      4.30 cm 2.96 cm/m   RA Area:     13.20 cm LA Vol (A2C): 31.1 ml 21.44 ml/m  RA Volume:   28.30 ml  19.51 ml/m LA Vol (A4C): 73.6 ml 50.74 ml/m  AORTIC VALVE AV Area (Vmax):    1.87 cm AV Area (Vmean):   1.90 cm AV Area (VTI):     1.85 cm AV Vmax:           152.50 cm/s AV Vmean:          105.350 cm/s  AV VTI:            0.286 m AV Peak Grad:      9.3 mmHg AV Mean Grad:      5.0 mmHg LVOT Vmax:         100.80 cm/s LVOT Vmean:        70.567 cm/s LVOT VTI:          0.187 m LVOT/AV VTI ratio: 0.65 AI PHT:            660 msec  AORTA Ao Root diam: 2.70 cm Ao Asc diam:  2.30 cm MITRAL VALVE                TRICUSPID VALVE MV Area (PHT): 3.57 cm     TR Peak grad:   40.4 mmHg MV Decel Time: 213 msec     TR Vmax:        318.00 cm/s MR Peak grad: 173.7 mmHg MR Mean grad: 124.0 mmHg    SHUNTS  MR Vmax:      659.00 cm/s   Systemic VTI:  0.19 m MR Vmean:     537.0 cm/s    Systemic Diam: 1.90 cm MV E velocity: 98.65 cm/s MV A velocity: 129.50 cm/s MV E/A ratio:  0.76 Epifanio Lesches MD Electronically signed by Epifanio Lesches MD Signature Date/Time: 05/18/2022/3:51:14 PM    Final    VAS [place] LOWER EXTREMITY VENOUS (DVT) (ONLY [facility] & [facility])  Result Date: 05/18/2022  Lower Venous DVT Study Patient Name:  Rita Myers  Date of Exam:   05/18/2022 Medical Rec #: [id number]    Accession #:    [id number] Date of Birth: 1945/11/17   Patient Gender: F Patient Age:   15 years Exam Location:  [facility] Procedure:      VAS [place] LOWER EXTREMITY VENOUS (DVT) Referring Phys: Glynn Octave --------------------------------------------------------------------------------  Indications: Pain.  Risk Factors: None identified. Limitations: Body habitus, poor ultrasound/tissue interface and patient pain tolerance. Comparison Study: 04/18/2022 - BILATERAL:                   - No evidence of deep vein thrombosis seen in the lower                   extremities,                   bilaterally.                   -                   RIGHT:                   - A cystic structure found in the popliteal fossa.                   - 2.2 x .6 x 2.6 cm fluid collection seen in posterior knee                   fossa.                   Moderate suprapatellar fluid seen and superficial edema noted                   in calf.                    LEFT:                    - Acomplex cystic structure is found in the popliteal fossa.                   - 6.6 x 4.8 x 5.2 cm complex appearing Baker's cyst. Moderate                   to severe                   suprapatellar fluid noted with superficial edema in the calf. Performing Technologist: Chanda Busing RVT  Examination Guidelines: A complete evaluation includes B-mode imaging, spectral Doppler, color Doppler, and power Doppler as needed of all accessible portions of each vessel. Bilateral testing is considered an integral part of a complete examination. Limited examinations for reoccurring indications may be performed as noted. The reflux portion of the exam is performed with the patient in reverse Trendelenburg.  +---------+---------------+---------+-----------+----------+--------------+ RIGHT    CompressibilityPhasicitySpontaneityPropertiesThrombus Aging +---------+---------------+---------+-----------+----------+--------------+ CFV  Full           Yes      Yes                                 +---------+---------------+---------+-----------+----------+--------------+ SFJ      Full                                                        +---------+---------------+---------+-----------+----------+--------------+ FV Prox  Full                                                        +---------+---------------+---------+-----------+----------+--------------+ FV Mid   Full                                                        +---------+---------------+---------+-----------+----------+--------------+ FV DistalFull                                                        +---------+---------------+---------+-----------+----------+--------------+ PFV      Full                                                        +---------+---------------+---------+-----------+----------+--------------+ POP      Full           Yes      Yes                                  +---------+---------------+---------+-----------+----------+--------------+ PTV      Full                                                        +---------+---------------+---------+-----------+----------+--------------+ PERO     Full                                                        +---------+---------------+---------+-----------+----------+--------------+   +---------+---------------+---------+-----------+----------+--------------+ LEFT     CompressibilityPhasicitySpontaneityPropertiesThrombus Aging +---------+---------------+---------+-----------+----------+--------------+ CFV      Full           Yes      Yes                                 +---------+---------------+---------+-----------+----------+--------------+  SFJ      Full                                                        +---------+---------------+---------+-----------+----------+--------------+ FV Prox  Full                                                        +---------+---------------+---------+-----------+----------+--------------+ FV Mid                  Yes      Yes                                 +---------+---------------+---------+-----------+----------+--------------+ FV Distal               Yes      Yes                                 +---------+---------------+---------+-----------+----------+--------------+ PFV      Full                                                        +---------+---------------+---------+-----------+----------+--------------+ POP      Full           Yes      Yes                                 +---------+---------------+---------+-----------+----------+--------------+ PTV      Full                                                        +---------+---------------+---------+-----------+----------+--------------+ PERO     Full                                                         +---------+---------------+---------+-----------+----------+--------------+     Summary: RIGHT: - There is no evidence of deep vein thrombosis in the lower extremity. However, portions of this examination were limited- see technologist comments above.  - No cystic structure found in the popliteal fossa.  LEFT: - There is no evidence of deep vein thrombosis in the lower extremity. However, portions of this examination were limited- see technologist comments above.  - No cystic structure found in the popliteal fossa.  *See table(s) above for measurements and observations. Electronically signed by Monica Martinez MD on 05/18/2022 at 3:30:20 PM.    Final      Patient Profile     77 year old female with past medical history  of hypertension, paroxysmal atrial fibrillation, hyperlipidemia for evaluation of bilateral lower extremity edema question congestive heart failure.   Assessment & Plan    bilateral lower extremity edema, anasarca -suspect multifactorial; has nephrotic syndrome as well as mildly reduced LVEF. RAP suggests edema is not primarily cardiac -hypoalbuminemia also contributing to 3rd spacing (today <1.5) -evaluation in process; labs for amyloid are pending -planned for potential renal biopsy 1/8, appreciate nephrology involvement. Ok to hold apixaban -no DVT on dopplers   Hypertension -improved on current regimen  Cardiomyopathy -unclear etiology -current EF 40-45%, was 50-55% in 2022 -changed from diltiazem to metoprolol -continue ARB -consider cardiac CTA to rule out coronary disease as an outpatient once she improves.   paroxysmal atrial fibrillation -CHA2DS2/VAS Stroke Risk Points=5  -has remained in sinus rhythm -ok to hold apixaban in the short term for renal biopsy, restart when cleared post procedure  Mixed hyperlipidemia -LDL 167, TG 166 -was on pravastatin as an outpatient, on rosuvastatin while admitted  Time spent at bedside answering questions, additional time  spent coordinating care with other medical teams. Complex medical decision making.  For questions or updates, please contact [facility] Please consult [email or web address] for contact info under     Signed, Jodelle Red, MD  05/20/2022, 11:38 AM

## 2022-05-20 NOTE — Evaluation (Signed)
Physical Therapy Evaluation Patient Details Name: Rita Myers MRN: [id number] DOB: [birth year] Today's Date: 05/20/2022  History of Present Illness  Pt is 77 year old presented [facility] on  05/18/22 with progressive lower extremity edema, SOB, weight gain. Pt with acute on chronic systolic heart failure and suspected nephrotic syndrome. PMH - afib, htn  Clinical Impression  Pt presents to PT with mobility decreased due to lt knee pain. It sounds like the lt knee has been painful for quite some time but has worsened recently. Suspect pt may have lt knee degenerative changes and now with recent fluid the pain has worsened. Pt able to amb with her rollator. Pt/daughter report recent OPPT overall did not help since pt was continuing to have LE swelling. Suggested that a mild compression sleeve may help knee but that they would need to watch how it affects the edema in that leg. Daughter and pt would like to have current medical problems controlled prior to looking at knee pain. Will follow acutely but should be able to return home with husband when medically ready.        Recommendations for follow up therapy are one component of a multi-disciplinary discharge planning process, led by the attending physician.  Recommendations may be updated based on patient status, additional functional criteria and insurance authorization.  Follow Up Recommendations No PT follow up      Assistance Recommended at Discharge PRN  Patient can return home with the following  Help with stairs or ramp for entrance;Assistance with cooking/housework    Equipment Recommendations None recommended by PT  Recommendations for Other Services       Functional Status Assessment Patient has had a recent decline in their functional status and demonstrates the ability to make significant improvements in function in a reasonable and predictable amount of time.     Precautions / Restrictions        Mobility  Bed Mobility Overal  bed mobility: Modified Independent                  Transfers Overall transfer level: Needs assistance Equipment used: Rollator (4 wheels) Transfers: Sit to/from Stand Sit to Stand: Min guard           General transfer comment: for safety and lines    Ambulation/Gait Ambulation/Gait assistance: Min guard Gait Distance (Feet): 160 Feet Assistive device: Rollator (4 wheels) Gait Pattern/deviations: Step-through pattern, Decreased step length - right, Decreased stance time - left, Antalgic Gait velocity: decr Gait velocity interpretation: <1.31 ft/sec, indicative of household ambulator   General Gait Details: Assist for safety.  Stairs            Wheelchair Mobility    Modified Rankin (Stroke Patients Only)       Balance Overall balance assessment: Mild deficits observed, not formally tested                                           Pertinent Vitals/Pain Pain Assessment Pain Assessment: Faces Faces Pain Scale: Hurts even more Pain Location: lt knee Pain Descriptors / Indicators: Guarding, Grimacing Pain Intervention(s): Limited activity within patient's tolerance, Monitored during session, Repositioned    Home Living Family/patient expects to be discharged to:: Private residence Living Arrangements: Spouse/significant other Available Help at Discharge: Family;Available 24 hours/day Type of Home: Apartment Home Access: Stairs to enter Entrance Stairs-Rails: Right Entrance Stairs-Number of Steps: flight  Home Equipment: Rollator (4 wheels)      Prior Function Prior Level of Function : Independent/Modified Independent             Mobility Comments: Using rollator. Limited by lt knee pain       Hand Dominance        Extremity/Trunk Assessment   Upper Extremity Assessment Upper Extremity Assessment: Overall WFL for tasks assessed    Lower Extremity Assessment Lower Extremity Assessment: LLE  deficits/detail LLE Deficits / Details: Limited by knee pain       Communication   Communication: No difficulties  Cognition Arousal/Alertness: Awake/alert Behavior During Therapy: WFL for tasks assessed/performed Overall Cognitive Status: Within Functional Limits for tasks assessed                                          General Comments      Exercises     Assessment/Plan    PT Assessment Patient needs continued PT services  PT Problem List Decreased mobility;Pain       PT Treatment Interventions DME instruction;Gait training;Functional mobility training;Stair training;Therapeutic activities;Therapeutic exercise;Patient/family education    PT Goals (Current goals can be found in the Care Plan section)  Acute Rehab PT Goals Patient Stated Goal: Be able to get up and move around on her own PT Goal Formulation: With patient/family Time For Goal Achievement: 06/03/22 Potential to Achieve Goals: Good    Frequency Min 3X/week     Co-evaluation               AM-PAC PT "6 Clicks" Mobility  Outcome Measure Help needed turning from your back to your side while in a flat bed without using bedrails?: None Help needed moving from lying on your back to sitting on the side of a flat bed without using bedrails?: None Help needed moving to and from a bed to a chair (including a wheelchair)?: A Little Help needed standing up from a chair using your arms (e.g., wheelchair or bedside chair)?: A Little Help needed to walk in hospital room?: A Little Help needed climbing 3-5 steps with a railing? : A Little 6 Click Score: 20    End of Session Equipment Utilized During Treatment: Gait belt Activity Tolerance: Patient limited by pain Patient left: in bed;with call bell/phone within reach;with bed alarm set;with family/visitor present Nurse Communication: Mobility status PT Visit Diagnosis: Other abnormalities of gait and mobility (R26.89);Pain Pain -  Right/Left: Left Pain - part of body: Knee    Time: 2229-7989 PT Time Calculation (min) (ACUTE ONLY): 18 min   Charges:   PT Evaluation $PT Eval Low Complexity: 1 Low          [facility] PT Acute Rehabilitation Services Office [phone]   Angelina Ok [facility] 05/20/2022, 3:10 PM

## 2022-05-21 ENCOUNTER — Encounter (HOSPITAL_COMMUNITY): Payer: Self-pay | Admitting: Internal Medicine

## 2022-05-21 DIAGNOSIS — R600 Localized edema: Secondary | ICD-10-CM | POA: Diagnosis not present

## 2022-05-21 DIAGNOSIS — E8809 Other disorders of plasma-protein metabolism, not elsewhere classified: Secondary | ICD-10-CM

## 2022-05-21 DIAGNOSIS — I429 Cardiomyopathy, unspecified: Secondary | ICD-10-CM

## 2022-05-21 DIAGNOSIS — N049 Nephrotic syndrome with unspecified morphologic changes: Secondary | ICD-10-CM | POA: Diagnosis not present

## 2022-05-21 LAB — BASIC METABOLIC PANEL
Anion gap: 7 (ref 5–15)
BUN: 38 mg/dL — ABNORMAL HIGH (ref 8–23)
CO2: 27 mmol/L (ref 22–32)
Calcium: 8.3 mg/dL — ABNORMAL LOW (ref 8.9–10.3)
Chloride: 104 mmol/L (ref 98–111)
Creatinine, Ser: 1.01 mg/dL — ABNORMAL HIGH (ref 0.44–1.00)
GFR, Estimated: 57 mL/min — ABNORMAL LOW (ref >60)
Glucose, Bld: 99 mg/dL (ref 70–99)
Potassium: 3.5 mmol/L (ref 3.5–5.1)
Sodium: 138 mmol/L (ref 135–145)

## 2022-05-21 MED ORDER — DICLOFENAC SODIUM 1 % EX GEL
2.0000 g | Freq: Four times a day (QID) | CUTANEOUS | Status: DC
  Administered 2022-05-21 – 2022-05-24 (×12): 2 g via TOPICAL
  Filled 2022-05-21: qty 100

## 2022-05-21 MED ORDER — TORSEMIDE 20 MG PO TABS
40.0000 mg | ORAL_TABLET | Freq: Two times a day (BID) | ORAL | Status: DC
  Administered 2022-05-21 – 2022-05-24 (×6): 40 mg via ORAL
  Filled 2022-05-21 (×6): qty 2

## 2022-05-21 MED ORDER — POTASSIUM CHLORIDE CRYS ER 20 MEQ PO TBCR
40.0000 meq | EXTENDED_RELEASE_TABLET | Freq: Once | ORAL | Status: AC
  Administered 2022-05-21: 40 meq via ORAL
  Filled 2022-05-21: qty 2

## 2022-05-21 NOTE — Progress Notes (Signed)
PROGRESS NOTE    Rita Myers  DTO:[id number] DOB: [birth year] DOA: 05/17/2022 PCP: Darrow Bussing, MD  77/F with history of paroxysmal A-fib on Eliquis, hypertension, dyslipidemia presented to the ED with progressive lower extremity edema X 1 month, recent shortness of breath and weight gain, recently started on Lasix. -In the ED blood pressure 158/69, creatinine 0.8, albumin 1.7, BNP 283, chest x-ray noted cardiomegaly and small bilateral effusions, UA with 3+ proteinuria -Echo EF 40-45%, RV normal -Diagnosed with nephrotic syndrome, started on diuretics   Subjective: -Continues to have discomfort in her left knee, limiting ambulation, swelling is improving  Assessment and Plan:  Nephrotic syndrome, new diagnosis with profound proteinuria, hypoalbuminemia -Echo with EF of 40-45% as well -Appreciate nephrology input, await serologies including SPEP and myeloma panel -She is 6.3 L negative -Nephrology following, starting torsemide today -Continue to hold apixaban 1 more day -Ambulate, PT eval completed  Acute on chronic systolic CHF -Echo with EF down to 40-45% -Changed torsemide, additional workup as above for nephrotic syndrome -Continue losartan -FU SPEP -Monitor urine output, BMP in a.m.  Paroxysmal A-fib -Continue Toprol, hold apixaban  Left knee pain -Acute on chronic, worsened in the setting of edema, suspect underlying osteoarthritis -Try postop boot, Voltaren gel  Hypothyroidism -Continue Synthroid   DVT prophylaxis: Holding apixaban Code Status: Full code Family Communication: Spouse at bedside Disposition Plan: Home likely 1 to 2 days  Consultants: Cards, nephrology   Procedures:   Antimicrobials:    Objective: Vitals:   05/20/22 2310 05/21/22 0322 05/21/22 0435 05/21/22 0725  BP: (!) 147/71 135/82  125/64  Pulse: 90 79  75  Resp: 15 14  16   Temp: 97.8 F (36.6 C) 97.6 F (36.4 C)  97.8 F (36.6 C)  TempSrc: Oral Oral  Oral  SpO2: 98% 97%   97%  Weight:   63.9 kg   Height:        Intake/Output Summary (Last 24 hours) at 05/21/2022 1012 Last data filed at 05/21/2022 0915 Gross per 24 hour  Intake 640 ml  Output 4550 ml  Net -3910 ml   Filed Weights   05/19/22 0620 05/20/22 0609 05/21/22 0435  Weight: 67.8 kg 67.5 kg 63.9 kg    Examination:  General exam: Pleasant female sitting up in bed, AAOx3, no distress HEENT: Positive JVD CVS: S1-S2, regular rhythm Lungs: Decreased breath sounds at the bases Abdomen: Soft, nontender, bowel sounds present Extremities: 1+ edema Skin: No rashes Psychiatry:  Mood & affect appropriate.     Data Reviewed:   CBC: Recent Labs  Lab 05/17/22 1927 05/19/22 0055  WBC 7.3 6.7  NEUTROABS 4.1  --   HGB 13.8 11.4*  HCT 42.6 33.1*  MCV 94.5 90.4  PLT 199 160   Basic Metabolic Panel: Recent Labs  Lab 05/17/22 1927 05/19/22 0055 05/20/22 0033 05/21/22 0026  NA 137 140 137 138  K 3.9 4.2 4.0 3.5  CL 104 107 105 104  CO2 26 27 28 27   GLUCOSE 95 97 120* 99  BUN 23 28* 34* 38*  CREATININE 0.83 0.96 1.00 1.01*  CALCIUM 9.0 8.1* 8.0* 8.3*  MG  --  1.9  --   --    GFR: Estimated Creatinine Clearance: 28.3 mL/min (A) (by C-G formula based on SCr of 1.01 mg/dL (H)). Liver Function Tests: Recent Labs  Lab 05/17/22 1927 05/20/22 0033  AST 43* 30  ALT 36 24  ALKPHOS 50 39  BILITOT 0.2* <0.1*  PROT 5.4* 4.4*  ALBUMIN 1.7* <1.5*  No results for input(s): "LIPASE", "AMYLASE" in the last 168 hours. No results for input(s): "AMMONIA" in the last 168 hours. Coagulation Profile: No results for input(s): "INR", "PROTIME" in the last 168 hours. Cardiac Enzymes: No results for input(s): "CKTOTAL", "CKMB", "CKMBINDEX", "TROPONINI" in the last 168 hours. BNP (last 3 results) Recent Labs    04/18/22 0941  PROBNP 717   HbA1C: Recent Labs    05/19/22 0055  HGBA1C 6.1*   CBG: No results for input(s): "GLUCAP" in the last 168 hours. Lipid Profile: Recent Labs     05/19/22 0055  CHOL 277*  HDL 77  LDLCALC 167*  TRIG 166*  CHOLHDL 3.6   Thyroid Function Tests: Recent Labs    05/18/22 1315 05/19/22 0055  TSH 5.415*  --   FREET4  --  1.03   Anemia Panel: No results for input(s): "VITAMINB12", "FOLATE", "FERRITIN", "TIBC", "IRON", "RETICCTPCT" in the last 72 hours. Urine analysis:    Component Value Date/Time   COLORURINE YELLOW 05/18/2022 1406   APPEARANCEUR HAZY (A) 05/18/2022 1406   LABSPEC 1.016 05/18/2022 1406   PHURINE 6.0 05/18/2022 1406   GLUCOSEU NEGATIVE 05/18/2022 1406   HGBUR SMALL (A) 05/18/2022 1406   BILIRUBINUR NEGATIVE 05/18/2022 1406   KETONESUR NEGATIVE 05/18/2022 1406   PROTEINUR >=300 (A) 05/18/2022 1406   NITRITE NEGATIVE 05/18/2022 1406   LEUKOCYTESUR NEGATIVE 05/18/2022 1406   Sepsis Labs: @LABRCNTIP (procalcitonin:4,lacticidven:4)  ) Recent Results (from the past 240 hour(s))  MRSA Next Gen by PCR, Nasal     Status: None   Collection Time: 05/19/22  1:23 AM   Specimen: Nasal Mucosa; Nasal Swab  Result Value Ref Range Status   MRSA by PCR Next Gen NOT DETECTED NOT DETECTED Final    Comment: (NOTE) The GeneXpert MRSA Assay (FDA approved for NASAL specimens only), is one component of a comprehensive MRSA colonization surveillance program. It is not intended to diagnose MRSA infection nor to guide or monitor treatment for MRSA infections. Test performance is not FDA approved in patients less than 2 years old. Performed at [facility], [place] [address] [place], [place] [address]      Radiology Studies: No results found.   Scheduled Meds:  diclofenac Sodium  2 g Topical QID   gabapentin  300 mg Oral BID   levothyroxine  37.5 mcg Oral q morning   losartan  50 mg Oral Daily   metoprolol succinate  25 mg Oral Daily   rosuvastatin  10 mg Oral Daily   sodium chloride flush  3 mL Intravenous Q12H   torsemide  40 mg Oral BID   Continuous Infusions:     LOS: 3 days    Time spent:  42min    Domenic Polite, MD [facility]   05/21/2022, 10:12 AM

## 2022-05-21 NOTE — Progress Notes (Signed)
Rounding Note    Patient Name: Rita Myers Date of Encounter: 05/21/2022  [place] Cardiologist: Kirk Ruths, MD   Subjective   No acute events overnight. Husband at bedside today. Leg pain improved. Feels that she is diuresing well.  Inpatient Medications    Scheduled Meds:  diclofenac Sodium  2 g Topical QID   gabapentin  300 mg Oral BID   levothyroxine  37.5 mcg Oral q morning   losartan  50 mg Oral Daily   metoprolol succinate  25 mg Oral Daily   rosuvastatin  10 mg Oral Daily   sodium chloride flush  3 mL Intravenous Q12H   torsemide  40 mg Oral BID   Continuous Infusions:   PRN Meds: acetaminophen **OR** acetaminophen, ondansetron **OR** ondansetron (ZOFRAN) IV   Vital Signs    Vitals:   05/20/22 2310 05/21/22 0322 05/21/22 0435 05/21/22 0725  BP: (!) 147/71 135/82  125/64  Pulse: 90 79  75  Resp: 15 14  16   Temp: 97.8 F (36.6 C) 97.6 F (36.4 C)  97.8 F (36.6 C)  TempSrc: Oral Oral  Oral  SpO2: 98% 97%  97%  Weight:   63.9 kg   Height:        Intake/Output Summary (Last 24 hours) at 05/21/2022 0917 Last data filed at 05/21/2022 0915 Gross per 24 hour  Intake 640 ml  Output 4550 ml  Net -3910 ml      05/21/2022    4:35 AM 05/20/2022    6:09 AM 05/19/2022    6:20 AM  Last 3 Weights  Weight (lbs) 140 lb 14 oz 148 lb 13 oz 149 lb 7.6 oz  Weight (kg) 63.9 kg 67.5 kg 67.8 kg      Telemetry    NSR- Personally Reviewed  Physical Exam   GEN: Well nourished, well developed in no acute distress HEENT: Normal, moist mucous membranes NECK: No JVD CARDIAC: regular rhythm, normal S1 and S2, no rubs or gallops. No murmur. VASCULAR: Radial and DP pulses 2+ bilaterally. No carotid bruits RESPIRATORY:  Clear to auscultation without rales, wheezing or rhonchi  ABDOMEN: Soft, non-tender, non-distended MUSCULOSKELETAL:  Ambulates independently SKIN: Warm and dry, 1+ bilateral LE edema NEUROLOGIC:  Alert and oriented x 3. No focal neuro  deficits noted. PSYCHIATRIC:  Normal affect    Labs    High Sensitivity Troponin:   Recent Labs  Lab 05/18/22 1134 05/18/22 1406  TROPONINIHS 12 14     Chemistry Recent Labs  Lab 05/17/22 1927 05/19/22 0055 05/20/22 0033 05/21/22 0026  NA 137 140 137 138  K 3.9 4.2 4.0 3.5  CL 104 107 105 104  CO2 26 27 28 27   GLUCOSE 95 97 120* 99  BUN 23 28* 34* 38*  CREATININE 0.83 0.96 1.00 1.01*  CALCIUM 9.0 8.1* 8.0* 8.3*  MG  --  1.9  --   --   PROT 5.4*  --  4.4*  --   ALBUMIN 1.7*  --  <1.5*  --   AST 43*  --  30  --   ALT 36  --  24  --   ALKPHOS 50  --  39  --   BILITOT 0.2*  --  <0.1*  --   GFRNONAA >60 >60 58* 57*  ANIONGAP 7 6 4* 7    Lipids  Recent Labs  Lab 05/19/22 0055  CHOL 277*  TRIG 166*  HDL 77  LDLCALC 167*  CHOLHDL 3.6  Hematology Recent Labs  Lab 05/17/22 1927 05/19/22 0055  WBC 7.3 6.7  RBC 4.51 3.66*  HGB 13.8 11.4*  HCT 42.6 33.1*  MCV 94.5 90.4  MCH 30.6 31.1  MCHC 32.4 34.4  RDW 12.7 12.8  PLT 199 160   Thyroid  Recent Labs  Lab 05/18/22 1315 05/19/22 0055  TSH 5.415*  --   FREET4  --  1.03    BNP Recent Labs  Lab 05/17/22 1927  BNP 283.2*     Radiology    No results found.   Patient Profile     77 year old female with past medical history of hypertension, paroxysmal atrial fibrillation, hyperlipidemia for evaluation of bilateral lower extremity edema question congestive heart failure.   Assessment & Plan    bilateral lower extremity edema, anasarca -suspect multifactorial; has nephrotic syndrome as well as mildly reduced LVEF. RAP suggests edema is not primarily cardiac -hypoalbuminemia also contributing to 3rd spacing (most recent <1.5) -evaluation in process; labs for amyloid are pending -planned for potential renal biopsy in the near future, appreciate nephrology involvement. Ok to hold apixaban -no DVT on dopplers -continue diuresis, initial charted weight 67.8 kg, weight today 63.9 kg, charted as net  negative 6.3 L but intake may not be accurate   Hypertension -improved on current regimen  Cardiomyopathy -unclear etiology -current EF 40-45%, was 50-55% in 2022 -changed from diltiazem to metoprolol -continue ARB -consider cardiac CTA to rule out coronary disease as an outpatient once she improves.   paroxysmal atrial fibrillation -CHA2DS2/VAS Stroke Risk Points=5  -has remained in sinus rhythm -ok to hold apixaban in the short term for renal biopsy, restart when cleared post procedure  Mixed hyperlipidemia -LDL 167, TG 166 -was on pravastatin as an outpatient, on rosuvastatin while admitted  Her cardiologist Dr. Katrinka Blazing has retired; they would like to continue to see Dr. Jens Som whom they met during this admission.  For questions or updates, please contact [facility] Please consult [email or web address] for contact info under     Signed, Jodelle Red, MD  05/21/2022, 9:17 AM

## 2022-05-21 NOTE — Consult Note (Signed)
Chief Complaint: Nephrotic syndrome  Referring Physician(s): Pearson Grippe  Supervising Physician: Michaelle Birks  Patient Status: [facility] - In-pt  History of Present Illness: Rita Myers is a 77 y.o. female with Afib on Eliquis and HTN who presented to the ED c/o shortness of breath, lower extremity swelling, and weight gain for about a month.  UA with 3+ proteinuria.  We are asked to perform a random renal biopsy.  No nausea/vomiting. No Fever/chills. Otherwise ROS negative.   Past Medical History:  Diagnosis Date   Aortic insufficiency    Essential hypertension 07/11/2017   Hyperlipemia    Hypertension    Hypokalemia 07/11/2017   Mitral regurgitation    Nausea, vomiting, and diarrhea 07/11/2017   Paroxysmal A-fib ([place]) 07/11/2017   Pulmonary hypertension ([place])    Syncope, vasovagal 07/11/2017    History reviewed. No pertinent surgical history.  Allergies: Patient has no known allergies.  Medications: Prior to Admission medications   Medication Sig Start Date End Date Taking? Authorizing Provider  apixaban (ELIQUIS) 5 MG TABS tablet TAKE 1 TABLET BY MOUTH TWICE A DAY 03/06/22  Yes Belva Crome, MD  B Complex-C (B-COMPLEX WITH VITAMIN C) tablet Take 1 tablet by mouth daily.   Yes [provider]  Calcium-Vitamin D (CALTRATE 600 PLUS-VIT D PO) Take 1 tablet by mouth in the morning and at bedtime.   Yes [provider]  ciprofloxacin-dexamethasone (CIPRODEX) OTIC suspension Place 4 drops into the left ear once a week. 11/13/21  Yes [provider]  gabapentin (NEURONTIN) 300 MG capsule Take 300 mg by mouth 2 (two) times daily.   Yes [provider]  levothyroxine (SYNTHROID) 25 MCG tablet Take 25 mcg by mouth every morning. 04/14/22  Yes [provider]  loratadine (CLARITIN) 10 MG tablet Take 10 mg by mouth daily.   Yes [provider]  losartan (COZAAR) 50 MG tablet TAKE 1 TABLET BY MOUTH EVERY DAY 12/27/21  Yes  Belva Crome, MD  pravastatin (PRAVACHOL) 20 MG tablet Take 20 mg by mouth daily.   Yes [provider]  TIADYLT ER 240 MG 24 hr capsule Take 240 mg by mouth daily. 03/02/22  Yes [provider]     Family History  Problem Relation Age of Onset   Diabetes Mother    Diabetes Sister    Diabetes Brother     Social History   Socioeconomic History   Marital status: Married    Spouse name: Not on file   Number of children: Not on file   Years of education: Not on file   Highest education level: Not on file  Occupational History   Not on file  Tobacco Use   Smoking status: Never   Smokeless tobacco: Never  Vaping Use   Vaping Use: Never used  Substance and Sexual Activity   Alcohol use: No   Drug use: No   Sexual activity: Not on file  Other Topics Concern   Not on file  Social History Narrative   Not on file   Social Determinants of Health   Financial Resource Strain: Not on file  Food Insecurity: No Food Insecurity (05/19/2022)   Hunger Vital Sign    Worried About Running Out of Food in the Last Year: Never true    Ran Out of Food in the Last Year: Never true  Transportation Needs: No Transportation Needs (05/19/2022)   PRAPARE - Hydrologist (Medical): No  Lack of Transportation (Non-Medical): No  Physical Activity: Not on file  Stress: Not on file  Social Connections: Not on file     Review of Systems: A 12 point ROS discussed and pertinent positives are indicated in the HPI above.  All other systems are negative.  Review of Systems  Vital Signs: BP 125/64 (BP Location: Left Arm)   Pulse 75   Temp 97.8 F (36.6 C) (Oral)   Resp 16   Ht 4\' 2"  (1.27 m)   Wt 140 lb 14 oz (63.9 kg)   SpO2 97%   BMI 39.62 kg/m   Physical Exam Vitals reviewed.  Constitutional:      Appearance: Normal appearance.  HENT:     Head: Normocephalic and atraumatic.  Cardiovascular:     Rate and Rhythm: Normal rate and regular  rhythm.  Pulmonary:     Effort: Pulmonary effort is normal. No respiratory distress.     Breath sounds: Normal breath sounds.  Abdominal:     Palpations: Abdomen is soft.  Musculoskeletal:        General: Normal range of motion.  Skin:    General: Skin is warm and dry.  Neurological:     General: No focal deficit present.     Mental Status: She is alert and oriented to person, place, and time.  Psychiatric:        Mood and Affect: Mood normal.        Behavior: Behavior normal.        Thought Content: Thought content normal.        Judgment: Judgment normal.     Imaging: ECHOCARDIOGRAM COMPLETE  Result Date: 05/18/2022    ECHOCARDIOGRAM REPORT   Patient Name:   Rita Myers Date of Exam: 05/18/2022 Medical Rec #:  [id number]   Height:       52.0 in Accession #:    [id number]  Weight:       141.0 lb Date of Birth:  18-Feb-1946  BSA:          1.451 m Patient Age:    4 years    BP:           177/79 mmHg Patient Gender: F           HR:           93 bpm. Exam Location:  Inpatient Procedure: 2D Echo, Cardiac Doppler and Color Doppler Indications:    Dyspnea R06.00  History:        Patient has prior history of Echocardiogram examinations, most                 recent 11/22/2020. Arrythmias:Atrial Fibrillation,                 Signs/Symptoms:Syncope; Risk Factors:Hypertension, Diabetes and                 Dyslipidemia.  Sonographer:    Ronny Flurry Referring Phys: [place]  1. Left ventricular ejection fraction, by estimation, is 40 to 45%. The left ventricle has mildly decreased function. The left ventricle demonstrates global hypokinesis. Left ventricular diastolic parameters are consistent with Grade II diastolic dysfunction (pseudonormalization). Elevated left atrial pressure.  2. Right ventricular systolic function is normal. The right ventricular size is normal. There is mildly elevated pulmonary artery systolic pressure. The estimated right ventricular systolic pressure is  46.9 mmHg.  3. Left atrial size was mildly dilated.  4. A small pericardial effusion is present.  5. The  mitral valve is abnormal. Mild mitral valve regurgitation. Moderate mitral annular calcification.  6. The aortic valve is tricuspid. Aortic valve regurgitation is trivial. Aortic valve sclerosis is present, with no evidence of aortic valve stenosis.  7. The inferior vena cava is normal in size with greater than 50% respiratory variability, suggesting right atrial pressure of 3 mmHg. FINDINGS  Left Ventricle: Left ventricular ejection fraction, by estimation, is 40 to 45%. The left ventricle has mildly decreased function. The left ventricle demonstrates global hypokinesis. The left ventricular internal cavity size was normal in size. There is  no left ventricular hypertrophy. Left ventricular diastolic parameters are consistent with Grade II diastolic dysfunction (pseudonormalization). Elevated left atrial pressure. Right Ventricle: The right ventricular size is normal. No increase in right ventricular wall thickness. Right ventricular systolic function is normal. There is mildly elevated pulmonary artery systolic pressure. The tricuspid regurgitant velocity is 3.18  m/s, and with an assumed right atrial pressure of 3 mmHg, the estimated right ventricular systolic pressure is 43.4 mmHg. Left Atrium: Left atrial size was mildly dilated. Right Atrium: Right atrial size was normal in size. Pericardium: A small pericardial effusion is present. Mitral Valve: The mitral valve is abnormal. Moderate mitral annular calcification. Mild mitral valve regurgitation. Tricuspid Valve: The tricuspid valve is normal in structure. Tricuspid valve regurgitation is mild. Aortic Valve: The aortic valve is tricuspid. Aortic valve regurgitation is trivial. Aortic regurgitation PHT measures 660 msec. Aortic valve sclerosis is present, with no evidence of aortic valve stenosis. Aortic valve mean gradient measures 5.0 mmHg. Aortic valve  peak gradient measures 9.3 mmHg. Aortic valve area, by VTI measures 1.85 cm. Pulmonic Valve: The pulmonic valve was grossly normal. Pulmonic valve regurgitation is trivial. Aorta: The aortic root and ascending aorta are structurally normal, with no evidence of dilitation. Venous: The inferior vena cava is normal in size with greater than 50% respiratory variability, suggesting right atrial pressure of 3 mmHg. IAS/Shunts: The interatrial septum was not well visualized.  LEFT VENTRICLE PLAX 2D LVIDd:         5.20 cm   Diastology LVIDs:         4.10 cm   LV e' medial:    4.13 cm/s LV PW:         0.90 cm   LV E/e' medial:  23.9 LV IVS:        0.90 cm   LV e' lateral:   4.90 cm/s LVOT diam:     1.90 cm   LV E/e' lateral: 20.1 LV SV:         53 LV SV Index:   37 LVOT Area:     2.84 cm  RIGHT VENTRICLE             IVC RV S prime:     18.10 cm/s  IVC diam: 1.20 cm TAPSE (M-mode): 3.0 cm LEFT ATRIUM           Index        RIGHT ATRIUM           Index LA diam:      4.30 cm 2.96 cm/m   RA Area:     13.20 cm LA Vol (A2C): 31.1 ml 21.44 ml/m  RA Volume:   28.30 ml  19.51 ml/m LA Vol (A4C): 73.6 ml 50.74 ml/m  AORTIC VALVE AV Area (Vmax):    1.87 cm AV Area (Vmean):   1.90 cm AV Area (VTI):     1.85 cm AV Vmax:  152.50 cm/s AV Vmean:          105.350 cm/s AV VTI:            0.286 m AV Peak Grad:      9.3 mmHg AV Mean Grad:      5.0 mmHg LVOT Vmax:         100.80 cm/s LVOT Vmean:        70.567 cm/s LVOT VTI:          0.187 m LVOT/AV VTI ratio: 0.65 AI PHT:            660 msec  AORTA Ao Root diam: 2.70 cm Ao Asc diam:  2.30 cm MITRAL VALVE                TRICUSPID VALVE MV Area (PHT): 3.57 cm     TR Peak grad:   40.4 mmHg MV Decel Time: 213 msec     TR Vmax:        318.00 cm/s MR Peak grad: 173.7 mmHg MR Mean grad: 124.0 mmHg    SHUNTS MR Vmax:      659.00 cm/s   Systemic VTI:  0.19 m MR Vmean:     537.0 cm/s    Systemic Diam: 1.90 cm MV E velocity: 98.65 cm/s MV A velocity: 129.50 cm/s MV E/A ratio:  0.76  Oswaldo Milian MD Electronically signed by Oswaldo Milian MD Signature Date/Time: 05/18/2022/3:51:14 PM    Final    VAS [place] LOWER EXTREMITY VENOUS (DVT) (ONLY [facility] & [facility])  Result Date: 05/18/2022  Lower Venous DVT Study Patient Name:  Rita Myers  Date of Exam:   05/18/2022 Medical Rec #: [id number]    Accession #:    [id number] Date of Birth: 09-28-1945   Patient Gender: F Patient Age:   80 years Exam Location:  [facility] Procedure:      VAS [place] LOWER EXTREMITY VENOUS (DVT) Referring Phys: Ezequiel Essex --------------------------------------------------------------------------------  Indications: Pain.  Risk Factors: None identified. Limitations: Body habitus, poor ultrasound/tissue interface and patient pain tolerance. Comparison Study: 04/18/2022 - BILATERAL:                   - No evidence of deep vein thrombosis seen in the lower                   extremities,                   bilaterally.                   -                   RIGHT:                   - A cystic structure found in the popliteal fossa.                   - 2.2 x .6 x 2.6 cm fluid collection seen in posterior knee                   fossa.                   Moderate suprapatellar fluid seen and superficial edema noted                   in calf.  LEFT:                   - Acomplex cystic structure is found in the popliteal fossa.                   - 6.6 x 4.8 x 5.2 cm complex appearing Baker's cyst. Moderate                   to severe                   suprapatellar fluid noted with superficial edema in the calf. Performing Technologist: Oliver Hum RVT  Examination Guidelines: A complete evaluation includes B-mode imaging, spectral Doppler, color Doppler, and power Doppler as needed of all accessible portions of each vessel. Bilateral testing is considered an integral part of a complete examination. Limited examinations for reoccurring indications may be performed as noted. The reflux portion of the exam is  performed with the patient in reverse Trendelenburg.  +---------+---------------+---------+-----------+----------+--------------+ RIGHT    CompressibilityPhasicitySpontaneityPropertiesThrombus Aging +---------+---------------+---------+-----------+----------+--------------+ CFV      Full           Yes      Yes                                 +---------+---------------+---------+-----------+----------+--------------+ SFJ      Full                                                        +---------+---------------+---------+-----------+----------+--------------+ FV Prox  Full                                                        +---------+---------------+---------+-----------+----------+--------------+ FV Mid   Full                                                        +---------+---------------+---------+-----------+----------+--------------+ FV DistalFull                                                        +---------+---------------+---------+-----------+----------+--------------+ PFV      Full                                                        +---------+---------------+---------+-----------+----------+--------------+ POP      Full           Yes      Yes                                 +---------+---------------+---------+-----------+----------+--------------+  PTV      Full                                                        +---------+---------------+---------+-----------+----------+--------------+ PERO     Full                                                        +---------+---------------+---------+-----------+----------+--------------+   +---------+---------------+---------+-----------+----------+--------------+ LEFT     CompressibilityPhasicitySpontaneityPropertiesThrombus Aging +---------+---------------+---------+-----------+----------+--------------+ CFV      Full           Yes      Yes                                  +---------+---------------+---------+-----------+----------+--------------+ SFJ      Full                                                        +---------+---------------+---------+-----------+----------+--------------+ FV Prox  Full                                                        +---------+---------------+---------+-----------+----------+--------------+ FV Mid                  Yes      Yes                                 +---------+---------------+---------+-----------+----------+--------------+ FV Distal               Yes      Yes                                 +---------+---------------+---------+-----------+----------+--------------+ PFV      Full                                                        +---------+---------------+---------+-----------+----------+--------------+ POP      Full           Yes      Yes                                 +---------+---------------+---------+-----------+----------+--------------+ PTV      Full                                                        +---------+---------------+---------+-----------+----------+--------------+  PERO     Full                                                        +---------+---------------+---------+-----------+----------+--------------+     Summary: RIGHT: - There is no evidence of deep vein thrombosis in the lower extremity. However, portions of this examination were limited- see technologist comments above.  - No cystic structure found in the popliteal fossa.  LEFT: - There is no evidence of deep vein thrombosis in the lower extremity. However, portions of this examination were limited- see technologist comments above.  - No cystic structure found in the popliteal fossa.  *See table(s) above for measurements and observations. Electronically signed by Monica Martinez MD on 05/18/2022 at 3:30:20 PM.    Final    DG Chest 2 View  Result Date: 05/17/2022 CLINICAL DATA:  Edema  in the lower extremities EXAM: CHEST - 2 VIEW COMPARISON:  07/11/2017 FINDINGS: Transverse diameter of heart is increased. Pulmonary vascularity is unremarkable. There are no signs of pulmonary edema or focal pulmonary consolidation. There is blunting of both lateral CP angles. There is no pneumothorax. IMPRESSION: Cardiomegaly. There are no signs of pulmonary edema or focal pulmonary consolidation. Small bilateral pleural effusions. Electronically Signed   By: Elmer Picker M.D.   On: 05/17/2022 19:53   MR LUMBAR SPINE WO CONTRAST  Result Date: 05/16/2022 CLINICAL DATA:  Low back pain with bilateral radiculopathy EXAM: MRI LUMBAR SPINE WITHOUT CONTRAST TECHNIQUE: Multiplanar, multisequence MR imaging of the lumbar spine was performed. No intravenous contrast was administered. COMPARISON:  None Available. FINDINGS: Segmentation:  Standard. Alignment:  Grade 1 anterolisthesis of L5 on S1. Vertebrae:  No fracture, evidence of discitis, or bone lesion. Conus medullaris and cauda equina: Conus extends to the L1 level. Conus and cauda equina appear normal. Paraspinal and other soft tissues: Multifibroid uterus. Disc levels: T12-L1: Annular disc bulge and endplate spurring. No foraminal or canal stenosis. L1-L2: Minimal disc bulge.  No foraminal or canal stenosis. L2-L3: Mild annular disc bulge. Mild bilateral facet hypertrophy. No foraminal or canal stenosis. L3-L4: Annular disc bulge with mild bilateral facet hypertrophy and ligamentum flavum buckling. Mild canal stenosis with left greater than right subarticular recess stenosis. Mild-moderate right and mild left foraminal stenosis. L4-L5: Diffuse disc bulge with mild-moderate bilateral facet arthropathy and ligamentum flavum buckling. Severe canal stenosis with mild-to-moderate bilateral foraminal stenosis. L5-S1: Diffuse disc bulge with moderate bilateral facet arthropathy. No canal stenosis. Moderate right and mild left foraminal stenosis. IMPRESSION: 1.  Multilevel lumbar spondylosis, most pronounced at L4-5 where there is severe canal stenosis and mild-to-moderate bilateral foraminal stenosis. 2. Moderate right and mild left foraminal stenosis at L5-S1. 3. Mild canal stenosis at L3-L4 with left greater than right subarticular recess stenosis. 4. Multifibroid uterus. Electronically Signed   By: Davina Poke D.O.   On: 05/16/2022 10:12    Labs:  CBC: Recent Labs    12/14/21 1004 05/17/22 1927 05/19/22 0055  WBC 7.4 7.3 6.7  HGB 13.8 13.8 11.4*  HCT 40.3 42.6 33.1*  PLT 179 199 160    COAGS: No results for input(s): "INR", "APTT" in the last 8760 hours.  BMP: Recent Labs    05/17/22 1927 05/19/22 0055 05/20/22 0033 05/21/22 0026  NA 137 140 137 138  K 3.9 4.2 4.0 3.5  CL 104  107 105 104  CO2 26 27 28 27   GLUCOSE 95 97 120* 99  BUN 23 28* 34* 38*  CALCIUM 9.0 8.1* 8.0* 8.3*  CREATININE 0.83 0.96 1.00 1.01*  GFRNONAA >60 >60 58* 57*    LIVER FUNCTION TESTS: Recent Labs    05/17/22 1927 05/20/22 0033  BILITOT 0.2* <0.1*  AST 43* 30  ALT 36 24  ALKPHOS 50 39  PROT 5.4* 4.4*  ALBUMIN 1.7* <1.5*    TUMOR MARKERS: No results for input(s): "AFPTM", "CEA", "CA199", "CHROMGRNA" in the last 8760 hours.  Assessment and Plan:  Proteinuria/nephrotic syndrome  Will proceed with image guided random renal biopsy tomorrow as IR schedule allows.  Risks and benefits of random renal biopsy was discussed with the patient and/or patient's family including, but not limited to bleeding, infection, damage to adjacent structures or low yield requiring additional tests.  All of the questions were answered and there is agreement to proceed.  Consent signed and in chart.  Thank you for allowing our service to participate in Rita Myers 's care.  Electronically Signed: Murrell Redden, PA-C   05/21/2022, 10:40 AM      I spent a total of 20 Minutes  in face to face in clinical consultation, greater than 50% of which was  counseling/coordinating care for random renal biopsy.

## 2022-05-21 NOTE — Plan of Care (Signed)

## 2022-05-21 NOTE — Progress Notes (Signed)
Admit: 05/17/2022 LOS: 3  28F with nephrotic syndrome, anasarca, unclear etiology.   Subjective:  Still some pain in L leg antlateral shin PT note reviewed, ok for home DC Diuresed well, >5.4L UOP neg 6.1L from admit Weights down 4kg Serologies thus far negative, below UP/C 5.2  01/06 0701 - 01/07 0700 In: 640 [P.O.:640] Out: 5350 [Urine:5350]  Filed Weights   05/19/22 0620 05/20/22 0609 05/21/22 0435  Weight: 67.8 kg 67.5 kg 63.9 kg    Scheduled Meds:  diclofenac Sodium  2 g Topical QID   furosemide  60 mg Intravenous Q6H   gabapentin  300 mg Oral BID   levothyroxine  37.5 mcg Oral q morning   losartan  50 mg Oral Daily   metoprolol succinate  25 mg Oral Daily   rosuvastatin  10 mg Oral Daily   sodium chloride flush  3 mL Intravenous Q12H   Continuous Infusions:   PRN Meds:.acetaminophen **OR** acetaminophen, ondansetron **OR** ondansetron (ZOFRAN) IV  Current Labs: reviewed   Latest Reference Range & Units 05/19/22 13:15  Protein Creatinine Ratio 0.00 - 0.15 mg/mgCre 5.19 (H)    Latest Reference Range & Units 05/19/22 13:17  C3 Complement 82 - 167 mg/dL 157  Complement C4, Body Fluid 12 - 38 mg/dL 33  Hepatitis B Surface Ag NON REACTIVE  NON REACTIVE  HCV Ab Non Reactive  Non Reactive  HCV Interp 1:  Comment  HIV Screen 4th Generation wRfx Non Reactive  Non Reactive   PENDING: SPEP, SFLC, ANA, ANCA  Physical Exam:  Blood pressure 125/64, pulse 75, temperature 97.8 F (36.6 C), temperature source Oral, resp. rate 16, height 4\' 2"  (1.27 m), weight 63.9 kg, SpO2 97 %. GEN: Elderly female, NAD ENT: NCAT EYES: EOMI CV: Regular, normal S1 and S2 PULM: Clear bilaterally, slightly diminished in the bases ABD: Soft, nontender SKIN: No petechia or purpura, no rashes EXT:2-3+ about the same as yesterday  A Nephrotic syndrome with heavy proteinuria, hypoalbuminemia, hematuria; wide differential including membranous nephropathy, monoclonal process, infection related  process; unable to obtain anti-PLA2R as inpatient. Atrial fibrillation on apixaban, held currently Hypertension, controlled, on losartan Hyperlipidemia, likely worsened by #1  P Await additional serologies IR will not be able to facilitate biopsy on 1/8.  I will make arrangements for quick outpatient biopsy Stop IV lasix, transtion to BID PO Torsemide and see how she does for 48h Daily weights, Daily Renal Panel, Strict I/Os, Avoid nephrotoxins (NSAIDs, judicious IV Contrast)    Pearson Grippe MD 05/21/2022, 9:15 AM  Recent Labs  Lab 05/19/22 0055 05/20/22 0033 05/21/22 0026  NA 140 137 138  K 4.2 4.0 3.5  CL 107 105 104  CO2 27 28 27   GLUCOSE 97 120* 99  BUN 28* 34* 38*  CREATININE 0.96 1.00 1.01*  CALCIUM 8.1* 8.0* 8.3*    Recent Labs  Lab 05/17/22 1927 05/19/22 0055  WBC 7.3 6.7  NEUTROABS 4.1  --   HGB 13.8 11.4*  HCT 42.6 33.1*  MCV 94.5 90.4  PLT 199 160

## 2022-05-22 ENCOUNTER — Inpatient Hospital Stay (HOSPITAL_COMMUNITY): Payer: Medicare Other

## 2022-05-22 DIAGNOSIS — I429 Cardiomyopathy, unspecified: Secondary | ICD-10-CM | POA: Diagnosis not present

## 2022-05-22 DIAGNOSIS — N049 Nephrotic syndrome with unspecified morphologic changes: Secondary | ICD-10-CM | POA: Diagnosis not present

## 2022-05-22 DIAGNOSIS — E8809 Other disorders of plasma-protein metabolism, not elsewhere classified: Secondary | ICD-10-CM | POA: Diagnosis not present

## 2022-05-22 DIAGNOSIS — R600 Localized edema: Secondary | ICD-10-CM | POA: Diagnosis not present

## 2022-05-22 LAB — PROTIME-INR
INR: 1.1 (ref 0.8–1.2)
Prothrombin Time: 13.6 seconds (ref 11.4–15.2)

## 2022-05-22 LAB — BASIC METABOLIC PANEL WITH GFR
Anion gap: 7 (ref 5–15)
BUN: 39 mg/dL — ABNORMAL HIGH (ref 8–23)
CO2: 27 mmol/L (ref 22–32)
Calcium: 8 mg/dL — ABNORMAL LOW (ref 8.9–10.3)
Chloride: 103 mmol/L (ref 98–111)
Creatinine, Ser: 0.87 mg/dL (ref 0.44–1.00)
GFR, Estimated: >60 mL/min
Glucose, Bld: 100 mg/dL — ABNORMAL HIGH (ref 70–99)
Potassium: 3.5 mmol/L (ref 3.5–5.1)
Sodium: 137 mmol/L (ref 135–145)

## 2022-05-22 LAB — KAPPA/LAMBDA LIGHT CHAINS
Kappa free light chain: 57.1 mg/L — ABNORMAL HIGH (ref 3.3–19.4)
Kappa, lambda light chain ratio: 2.83 — ABNORMAL HIGH (ref 0.26–1.65)
Lambda free light chains: 20.2 mg/L (ref 5.7–26.3)

## 2022-05-22 LAB — ANCA TITERS
Atypical P-ANCA titer: <1:20 {titer}
C-ANCA: <1:20 {titer}
P-ANCA: <1:20 {titer}

## 2022-05-22 LAB — IMMUNOFIXATION, URINE

## 2022-05-22 MED ORDER — GELATIN ABSORBABLE 12-7 MM EX MISC
1.0000 | Freq: Once | CUTANEOUS | Status: DC
  Filled 2022-05-22: qty 1

## 2022-05-22 MED ORDER — MIDAZOLAM HCL 2 MG/2ML IJ SOLN
INTRAMUSCULAR | Status: AC
  Filled 2022-05-22: qty 2

## 2022-05-22 MED ORDER — LIDOCAINE HCL (PF) 1 % IJ SOLN
INTRAMUSCULAR | Status: AC
  Filled 2022-05-22: qty 30

## 2022-05-22 MED ORDER — LIDOCAINE HCL (PF) 1 % IJ SOLN
8.0000 mL | Freq: Once | INTRAMUSCULAR | Status: AC
  Administered 2022-05-22: 8 mL via INTRADERMAL

## 2022-05-22 MED ORDER — GELATIN ABSORBABLE 12-7 MM EX MISC
CUTANEOUS | Status: AC
  Filled 2022-05-22: qty 1

## 2022-05-22 MED ORDER — FENTANYL CITRATE (PF) 100 MCG/2ML IJ SOLN
INTRAMUSCULAR | Status: AC | PRN
  Administered 2022-05-22: 50 ug via INTRAVENOUS

## 2022-05-22 MED ORDER — POTASSIUM CHLORIDE CRYS ER 20 MEQ PO TBCR
40.0000 meq | EXTENDED_RELEASE_TABLET | Freq: Once | ORAL | Status: AC
  Administered 2022-05-22: 40 meq via ORAL
  Filled 2022-05-22: qty 2

## 2022-05-22 MED ORDER — FENTANYL CITRATE (PF) 100 MCG/2ML IJ SOLN
INTRAMUSCULAR | Status: AC
  Filled 2022-05-22: qty 2

## 2022-05-22 MED ORDER — MIDAZOLAM HCL 2 MG/2ML IJ SOLN
INTRAMUSCULAR | Status: AC | PRN
  Administered 2022-05-22: 1 mg via INTRAVENOUS

## 2022-05-22 MED ORDER — POLYETHYLENE GLYCOL 3350 17 G PO PACK
34.0000 g | PACK | Freq: Once | ORAL | Status: AC
  Administered 2022-05-22: 34 g via ORAL
  Filled 2022-05-22: qty 2

## 2022-05-22 NOTE — Care Management Important Message (Signed)
Important Message  Patient Details  Name: Rita Myers MRN: [id number] Date of Birth: 1945-10-19   Medicare Important Message Given:  Yes     Kendle Turbin [address] 05/22/2022, 3:05 PM

## 2022-05-22 NOTE — Plan of Care (Signed)
Patient is on oral diuretics. Can follow up with cardiology. Will help to arrange. Otherwise cardiology will sign off.

## 2022-05-22 NOTE — Progress Notes (Signed)
Patient ID: Rita Myers, female   DOB: [birth year], 77 y.o.   MRN: [id number] [facility] Progress Note   Assessment/ Plan:   1.  Nephrotic syndrome: Unrevealing serologies to indicate etiology of her nephrotic syndrome and earlier today underwent renal biopsy (sample will be sent to [facility] labs).  Diuresed successfully to date with preserved renal function/GFR and she may be able to discharge home in the next 24 hours if there are no emergent problems arising to prevent discharge. 2.  Atrial fibrillation: Rate controlled and with apixaban currently on hold to facilitate renal biopsy with plans to restart on 1/10. 3.  Hypertension: Well-controlled on ARB-losartan that also helps with her proteinuria management. 4.  Anemia: Mild and without overt loss, will continue to monitor trend.  Testing for plasma cell disorder/monoclonal gammopathy pending.  Subjective:   Reports to have tolerated kidney biopsy earlier today without problems.  Currently laying flat on her back as instructed.   Objective:   BP 125/62   Pulse 83   Temp 97.6 F (36.4 C) (Axillary)   Resp 20   Ht 4\' 2"  (1.27 m)   Wt 63 kg   SpO2 95%   BMI 39.06 kg/m   Intake/Output Summary (Last 24 hours) at 05/22/2022 1157 Last data filed at 05/21/2022 2337 Gross per 24 hour  Intake --  Output 1700 ml  Net -1700 ml   Weight change: -0.9 kg  Physical Exam: Gen: Comfortably resting in bed, daughter at bedside CVS: Pulse regular rhythm, normal rate, S1 and S2 normal Resp: Clear to auscultation bilaterally, no rales/rhonchi Abd: Soft, flat, nontender, bowel sounds normal Ext: 2+ bilateral lower extremity edema with wrinkling of skin  Imaging: 2338 BIOPSY (KIDNEY)  Result Date: 05/22/2022 INDICATION: 77 year old female referred for medical renal biopsy EXAM: ULTRASOUND-GUIDED MEDICAL RENAL BIOPSY MEDICATIONS: None. ANESTHESIA/SEDATION: Moderate (conscious) sedation was employed during this procedure. A total of Versed  1.0 mg and Fentanyl 50 mcg was administered intravenously by the radiology nurse. Total intra-service moderate Sedation Time: 10 minutes. The patient's level of consciousness and vital signs were monitored continuously by radiology nursing throughout the procedure under my direct supervision. COMPLICATIONS: None PROCEDURE: Informed written consent was obtained from the patient after a thorough discussion of the procedural risks, benefits and alternatives. All questions were addressed. Maximal Sterile Barrier Technique was utilized including caps, mask, sterile gowns, sterile gloves, sterile drape, hand hygiene and skin antiseptic. A timeout was performed prior to the initiation of the procedure. Patient was positioned prone position on the gantry table. Images were stored sent to PACs. Once the patient is prepped and draped in the usual sterile fashion, the skin and subcutaneous tissues overlying the left kidney were generously infiltrated 1% lidocaine for local anesthesia. Using ultrasound guidance, a 15 gauge guide needle was advanced into the lower cortex of the left kidney. Once we confirmed location of the needle tip, 2 separate 16 gauge core biopsy were achieved. Two Gel-Foam pledgets were infused with a small amount of saline. The needle was removed. Final images were stored. The patient tolerated the procedure well and remained hemodynamically stable throughout. No complications were encountered and no significant blood loss encountered. IMPRESSION: Status post ultrasound-guided medical renal biopsy Signed, 70. Yvone Neu, RPVI [facility] Radiology Electronically Signed   By: [facility] D.O.   On: 05/22/2022 09:37    Labs: BMET Recent Labs  Lab 05/17/22 1927 05/19/22 0055 05/20/22 0033 05/21/22 0026 05/22/22 0018  NA 137  140 137 138 137  K 3.9 4.2 4.0 3.5 3.5  CL 104 107 105 104 103  CO2 26 27 28 27 27   GLUCOSE 95 97 120* 99 100*   BUN 23 28* 34* 38* 39*  CREATININE 0.83 0.96 1.00 1.01* 0.87  CALCIUM 9.0 8.1* 8.0* 8.3* 8.0*   CBC Recent Labs  Lab 05/17/22 1927 05/19/22 0055  WBC 7.3 6.7  NEUTROABS 4.1  --   HGB 13.8 11.4*  HCT 42.6 33.1*  MCV 94.5 90.4  PLT 199 160    Medications:     diclofenac Sodium  2 g Topical QID   gabapentin  300 mg Oral BID   gelatin adsorbable       gelatin adsorbable  1 each Topical Once   levothyroxine  37.5 mcg Oral q morning   lidocaine (PF)       losartan  50 mg Oral Daily   metoprolol succinate  25 mg Oral Daily   midazolam       potassium chloride  40 mEq Oral Once   rosuvastatin  10 mg Oral Daily   sodium chloride flush  3 mL Intravenous Q12H   torsemide  40 mg Oral BID    Elmarie Shiley, MD 05/22/2022, 11:57 AM

## 2022-05-22 NOTE — Progress Notes (Signed)
Mobility Specialist Progress Note    05/22/22 1219  Mobility  Activity Ambulated with assistance to bathroom  Level of Assistance Contact guard assist, steadying assist  Assistive Device Four wheel walker  Distance Ambulated (ft) 20 ft (10+10)  Activity Response Tolerated well  Mobility Referral Yes  $Mobility charge 1 Mobility   Pre-Mobility: 78 HR, 98% SpO2 Post-Mobility: 83 HR, 97% SpO2  Pt received and agreeable. No complaints. Returned to bed with call bell in reach.    Hildred Alamin Mobility Specialist  Please Psychologist, sport and exercise or Rehab Office at [phone]

## 2022-05-22 NOTE — Procedures (Signed)
Interventional Radiology Procedure Note  Procedure: US guided biopsy of left kidney, medical renal Complications: None EBL: None Recommendations: - Bedrest 2 hours.  Ambulate per primary order - Routine wound care - Follow up pathology - Advance diet ok per primary order  Signed,  Corrie Mckusick, DO

## 2022-05-22 NOTE — Progress Notes (Signed)
PROGRESS NOTE    Rita Myers  ZOX:[id number] DOB: 12-29-1945 DOA: 05/17/2022 PCP: Darrow Bussing, MD  77/F with history of paroxysmal A-fib on Eliquis, hypertension, dyslipidemia presented to the ED with progressive lower extremity edema X 1 month, recent shortness of breath and weight gain, recently started on Lasix. -In the ED blood pressure 158/69, creatinine 0.8, albumin 1.7, BNP 283, chest x-ray noted cardiomegaly and small bilateral effusions, UA with 3+ proteinuria -Echo EF 40-45%, RV normal -Diagnosed with nephrotic syndrome, started on diuretics   Subjective: -N.p.o. for kidney biopsy today  Assessment and Plan:  Nephrotic syndrome, new diagnosis with profound proteinuria, hypoalbuminemia -Echo with EF of 40-45% as well -Appreciate nephrology input, await serologies including SPEP and myeloma panel -Diuresed with IV Lasix, she is 7.8 L negative, now on oral torsemide -IR consulting, kidney biopsy today, hold apixaban 48 hours -Ambulate, PT eval completed  Acute on chronic systolic CHF -Echo with EF down to 40-45% -Changed torsemide, additional workup as above for nephrotic syndrome -Continue losartan -FU SPEP -BMP in a.m.  Paroxysmal A-fib -Continue Toprol, hold apixaban  Left knee pain -Acute on chronic, worsened in the setting of edema, suspect underlying osteoarthritis -Try postop boot, Voltaren gel  Hypothyroidism -Continue Synthroid   DVT prophylaxis: Holding apixaban Code Status: Full code Family Communication: Spouse at bedside Disposition Plan: Home likely tomorrow  Consultants: Cards, nephrology   Procedures:   Antimicrobials:    Objective: Vitals:   05/22/22 0945 05/22/22 0947 05/22/22 1000 05/22/22 1030  BP: 123/76 123/76 121/70 (!) 118/58  Pulse: 74 71 75 91  Resp: 15 18 17 20   Temp:    97.6 F (36.4 C)  TempSrc:    Axillary  SpO2: 97% 95% 97% 96%  Weight:      Height:        Intake/Output Summary (Last 24 hours) at 05/22/2022  1053 Last data filed at 05/21/2022 2337 Gross per 24 hour  Intake --  Output 1700 ml  Net -1700 ml   Filed Weights   05/20/22 0609 05/21/22 0435 05/22/22 0611  Weight: 67.5 kg 63.9 kg 63 kg    Examination:  General exam: Pleasant female sitting up in bed, AAOx3, no distress HEENT: Positive JVD CVS: S1-S2, regular rhythm Lungs: Clear bilaterally Abdomen: Soft, nontender, bowel sounds present Extremities: Trace edema Skin: No rashes Psychiatry:  Mood & affect appropriate.     Data Reviewed:   CBC: Recent Labs  Lab 05/17/22 1927 05/19/22 0055  WBC 7.3 6.7  NEUTROABS 4.1  --   HGB 13.8 11.4*  HCT 42.6 33.1*  MCV 94.5 90.4  PLT 199 160   Basic Metabolic Panel: Recent Labs  Lab 05/17/22 1927 05/19/22 0055 05/20/22 0033 05/21/22 0026 05/22/22 0018  NA 137 140 137 138 137  K 3.9 4.2 4.0 3.5 3.5  CL 104 107 105 104 103  CO2 26 27 28 27 27   GLUCOSE 95 97 120* 99 100*  BUN 23 28* 34* 38* 39*  CREATININE 0.83 0.96 1.00 1.01* 0.87  CALCIUM 9.0 8.1* 8.0* 8.3* 8.0*  MG  --  1.9  --   --   --    GFR: Estimated Creatinine Clearance: 32.6 mL/min (by C-G formula based on SCr of 0.87 mg/dL). Liver Function Tests: Recent Labs  Lab 05/17/22 1927 05/20/22 0033  AST 43* 30  ALT 36 24  ALKPHOS 50 39  BILITOT 0.2* <0.1*  PROT 5.4* 4.4*  ALBUMIN 1.7* <1.5*   No results for input(s): "LIPASE", "AMYLASE" in the  last 168 hours. No results for input(s): "AMMONIA" in the last 168 hours. Coagulation Profile: Recent Labs  Lab 05/22/22 0018  INR 1.1   Cardiac Enzymes: No results for input(s): "CKTOTAL", "CKMB", "CKMBINDEX", "TROPONINI" in the last 168 hours. BNP (last 3 results) Recent Labs    04/18/22 0941  PROBNP 717   HbA1C: No results for input(s): "HGBA1C" in the last 72 hours.  CBG: No results for input(s): "GLUCAP" in the last 168 hours. Lipid Profile: No results for input(s): "CHOL", "HDL", "LDLCALC", "TRIG", "CHOLHDL", "LDLDIRECT" in the last 72  hours.  Thyroid Function Tests: No results for input(s): "TSH", "T4TOTAL", "FREET4", "T3FREE", "THYROIDAB" in the last 72 hours.  Anemia Panel: No results for input(s): "VITAMINB12", "FOLATE", "FERRITIN", "TIBC", "IRON", "RETICCTPCT" in the last 72 hours. Urine analysis:    Component Value Date/Time   COLORURINE YELLOW 05/18/2022 1406   APPEARANCEUR HAZY (A) 05/18/2022 1406   LABSPEC 1.016 05/18/2022 1406   PHURINE 6.0 05/18/2022 1406   GLUCOSEU NEGATIVE 05/18/2022 1406   HGBUR SMALL (A) 05/18/2022 1406   BILIRUBINUR NEGATIVE 05/18/2022 1406   KETONESUR NEGATIVE 05/18/2022 1406   PROTEINUR >=300 (A) 05/18/2022 1406   NITRITE NEGATIVE 05/18/2022 1406   LEUKOCYTESUR NEGATIVE 05/18/2022 1406   Sepsis Labs: @LABRCNTIP (procalcitonin:4,lacticidven:4)  ) Recent Results (from the past 240 hour(s))  MRSA Next Gen by PCR, Nasal     Status: None   Collection Time: 05/19/22  1:23 AM   Specimen: Nasal Mucosa; Nasal Swab  Result Value Ref Range Status   MRSA by PCR Next Gen NOT DETECTED NOT DETECTED Final    Comment: (NOTE) The GeneXpert MRSA Assay (FDA approved for NASAL specimens only), is one component of a comprehensive MRSA colonization surveillance program. It is not intended to diagnose MRSA infection nor to guide or monitor treatment for MRSA infections. Test performance is not FDA approved in patients less than 35 years old. Performed at [facility], [place] [address], [place], [place] [address]      Radiology Studies: US BIOPSY (KIDNEY)  Result Date: 05/22/2022 INDICATION: 77 year old female referred for medical renal biopsy EXAM: ULTRASOUND-GUIDED MEDICAL RENAL BIOPSY MEDICATIONS: None. ANESTHESIA/SEDATION: Moderate (conscious) sedation was employed during this procedure. A total of Versed 1.0 mg and Fentanyl 50 mcg was administered intravenously by the radiology nurse. Total intra-service moderate Sedation Time: 10 minutes. The patient's level of consciousness and vital  signs were monitored continuously by radiology nursing throughout the procedure under my direct supervision. COMPLICATIONS: None PROCEDURE: Informed written consent was obtained from the patient after a thorough discussion of the procedural risks, benefits and alternatives. All questions were addressed. Maximal Sterile Barrier Technique was utilized including caps, mask, sterile gowns, sterile gloves, sterile drape, hand hygiene and skin antiseptic. A timeout was performed prior to the initiation of the procedure. Patient was positioned prone position on the gantry table. Images were stored sent to PACs. Once the patient is prepped and draped in the usual sterile fashion, the skin and subcutaneous tissues overlying the left kidney were generously infiltrated 1% lidocaine for local anesthesia. Using ultrasound guidance, a 15 gauge guide needle was advanced into the lower cortex of the left kidney. Once we confirmed location of the needle tip, 2 separate 16 gauge core biopsy were achieved. Two Gel-Foam pledgets were infused with a small amount of saline. The needle was removed. Final images were stored. The patient tolerated the procedure well and remained hemodynamically stable throughout. No complications were encountered and no significant blood loss encountered. IMPRESSION: Status  post ultrasound-guided medical renal biopsy Signed, Yvone Neu. Miachel Roux, RPVI [facility]iology [facility] Radiology Electronically Signed   By: Gilmer Mor D.O.   On: 05/22/2022 09:37     Scheduled Meds:  diclofenac Sodium  2 g Topical QID   gabapentin  300 mg Oral BID   gelatin adsorbable       gelatin adsorbable  1 each Topical Once   levothyroxine  37.5 mcg Oral q morning   lidocaine (PF)       losartan  50 mg Oral Daily   metoprolol succinate  25 mg Oral Daily   midazolam       rosuvastatin  10 mg Oral Daily   sodium chloride flush  3 mL Intravenous Q12H   torsemide  40 mg Oral  BID   Continuous Infusions:   LOS: 4 days    Time spent:    Zannie Cove, MD [facility]   05/22/2022, 10:53 AM

## 2022-05-22 NOTE — Progress Notes (Signed)
Transported to IR   by bed awake and alert. 

## 2022-05-23 ENCOUNTER — Inpatient Hospital Stay (HOSPITAL_COMMUNITY): Payer: Medicare Other

## 2022-05-23 ENCOUNTER — Encounter: Payer: Medicare Other | Admitting: Physical Therapy

## 2022-05-23 DIAGNOSIS — M7989 Other specified soft tissue disorders: Secondary | ICD-10-CM | POA: Diagnosis not present

## 2022-05-23 LAB — RESP PANEL BY RT-PCR (RSV, FLU A&B, COVID)  RVPGX2
Influenza A by PCR: NEGATIVE
Influenza B by PCR: NEGATIVE
Resp Syncytial Virus by PCR: NEGATIVE
SARS Coronavirus 2 by RT PCR: NEGATIVE

## 2022-05-23 LAB — BASIC METABOLIC PANEL
Anion gap: 10 (ref 5–15)
BUN: 41 mg/dL — ABNORMAL HIGH (ref 8–23)
CO2: 25 mmol/L (ref 22–32)
Calcium: 8 mg/dL — ABNORMAL LOW (ref 8.9–10.3)
Chloride: 102 mmol/L (ref 98–111)
Creatinine, Ser: 0.92 mg/dL (ref 0.44–1.00)
GFR, Estimated: >60 mL/min (ref >60)
Glucose, Bld: 112 mg/dL — ABNORMAL HIGH (ref 70–99)
Potassium: 4 mmol/L (ref 3.5–5.1)
Sodium: 137 mmol/L (ref 135–145)

## 2022-05-23 MED ORDER — POTASSIUM CHLORIDE CRYS ER 20 MEQ PO TBCR
40.0000 meq | EXTENDED_RELEASE_TABLET | Freq: Every day | ORAL | 0 refills | Status: DC
  Filled 2022-05-23: qty 60, 30d supply, fill #0

## 2022-05-23 MED ORDER — TORSEMIDE 20 MG PO TABS
40.0000 mg | ORAL_TABLET | Freq: Two times a day (BID) | ORAL | 0 refills | Status: DC
  Filled 2022-05-23: qty 120, 30d supply, fill #0

## 2022-05-23 MED ORDER — METOPROLOL SUCCINATE ER 25 MG PO TB24
25.0000 mg | ORAL_TABLET | Freq: Every day | ORAL | 0 refills | Status: DC
  Filled 2022-05-23: qty 30, 30d supply, fill #0

## 2022-05-23 NOTE — Discharge Summary (Signed)
Physician Discharge Summary  Rita Myers OIZ:[id number] DOB: 11-05-1945 DOA: 05/17/2022  PCP: Darrow Bussing, MD  Admit date: 05/17/2022 Discharge date: 05/24/2022  Time spent: 45 minutes  Recommendations for Outpatient Follow-up:  Nephrology Dr. Sabra Heck in 10 days,  follow-up kidney biopsy PCP Dr. Docia Chuck in 1 to 2 weeks Cardiology Dr. Jodelle Red in 1 month   Discharge Diagnoses:  Principal Problem: Nephrotic syndrome Acute systolic CHF Severe hypoalbuminemia Right knee pain   Heart failure with reduced ejection fraction (HCC)   Paroxysmal atrial fibrillation (HCC)   Essential hypertension   Prediabetes   Hypothyroidism   Chronic anticoagulation   Proteinuria   Bilateral lower extremity edema   Hypoalbuminemia   Nephrotic syndrome   Cardiomyopathy [facility])   Discharge Condition: Improving  Diet recommendation: Low-sodium, heart healthy  Filed Weights   05/21/22 0435 05/22/22 0611 05/23/22 0408  Weight: 63.9 kg 63 kg 63.3 kg    History of present illness:  76/F with history of paroxysmal A-fib on Eliquis, hypertension, dyslipidemia presented to the ED with progressive lower extremity edema X 1 month, recent shortness of breath and weight gain, recently started on Lasix. -In the ED blood pressure 158/69, creatinine 0.8, albumin 1.7, BNP 283, chest x-ray noted cardiomegaly and small bilateral effusions, UA with 3+ proteinuria  Hospital Course:   Nephrotic syndrome, new diagnosis with profound proteinuria, hypoalbuminemia, edema -Echo with EF of 40-45% as well -Appreciate nephrology input, SPEP unremarkable, immunofixation with mildly elevated kappa light chains and ratio, await kidney biopsy, this was completed 1/8 -Continue to hold apixaban 1 more day, resume tomorrow 1/10 -Diuresed well with IV Lasix, she is 9.2 L negative, now on oral torsemide -Home tomorrow, follow-up with Dr. Marisue Humble at [facility]   Acute on chronic systolic  CHF -Echo with EF down to 40-45% -As above, diuretics changed to torsemide, additional workup as above for nephrotic syndrome -Continue losartan   Cough, URI -Flu COVID and RSV negative, chest x-ray unremarkable, supportive care   Paroxysmal A-fib -Continue Toprol, hold apixaban, resume tomorrow   Left knee pain -Acute on chronic, worsened in the setting of edema, suspect underlying osteoarthritis -postop boot, Voltaren gel, slowly improving,   Hypothyroidism -Continue Synthroid  Procedure: Kidney biopsy by interventional radiology 1/8  Discharge Exam: Vitals:   05/23/22 1143 05/23/22 1558  BP: (!) 143/60 138/66  Pulse: 72 87  Resp: (!) 23 19  Temp: 98.5 F (36.9 C) 98.2 F (36.8 C)  SpO2: 97% 97%   General exam: Pleasant female sitting up in bed, AAOx3, no distress HEENT: No JVD 's S: S1-S2, regular rhythm Lungs: Clear bilaterally Abdomen: Soft, nontender, bowel sounds present Extremities: Trace edema Skin: No rashes Psychiatry:  Mood & affect appropriate.  Discharge Instructions    Allergies as of 05/23/2022   No Known Allergies      Medication List     STOP taking these medications    Tiadylt ER 240 MG 24 hr capsule Generic drug: diltiazem       TAKE these medications    B-complex with vitamin C tablet Take 1 tablet by mouth daily.   CALTRATE 600 PLUS-VIT D PO Take 1 tablet by mouth in the morning and at bedtime.   ciprofloxacin-dexamethasone OTIC suspension Commonly known as: CIPRODEX Place 4 drops into the left ear once a week.   Eliquis 5 MG Tabs tablet Generic drug: apixaban TAKE 1 TABLET BY MOUTH TWICE A DAY   gabapentin 300 MG capsule Commonly known as: NEURONTIN Take 300 mg  by mouth 2 (two) times daily.   levothyroxine 25 MCG tablet Commonly known as: SYNTHROID Take 25 mcg by mouth every morning.   loratadine 10 MG tablet Commonly known as: CLARITIN Take 10 mg by mouth daily.   losartan 50 MG tablet Commonly known as:  COZAAR TAKE 1 TABLET BY MOUTH EVERY DAY   metoprolol succinate 25 MG 24 hr tablet Commonly known as: TOPROL-XL Take 1 tablet (25 mg total) by mouth daily. Start taking on: May 24, 2022   potassium chloride SA 20 MEQ tablet Commonly known as: KLOR-CON M Take 2 tablets (40 mEq total) by mouth daily.   pravastatin 20 MG tablet Commonly known as: PRAVACHOL Take 20 mg by mouth daily.   Torsemide 40 MG Tabs Take 40 mg by mouth 2 (two) times daily.       No Known Allergies  Follow-up Information     Joylene Grapes, NP Follow up on 05/29/2022.   Specialties: Cardiology, Family Medicine Why: 2:45PM. Cardiology follow up Contact information: [address] [place] [address] [phone]                  The results of significant diagnostics from this hospitalization (including imaging, microbiology, ancillary and laboratory) are listed below for reference.    Significant Diagnostic Studies: DG CHEST PORT 1 VIEW  Result Date: 05/23/2022 CLINICAL DATA:  Cough-DROPLET PRECAUTIONS [id number] Cough 10031 EXAM: PORTABLE CHEST 1 VIEW COMPARISON:  None Available. FINDINGS: Normal mediastinum and cardiac silhouette. Normal pulmonary vasculature. No evidence of effusion, infiltrate, or pneumothorax. No acute bony abnormality. IMPRESSION: No acute cardiopulmonary process. Electronically Signed   By: Genevive Bi M.D.   On: 05/23/2022 08:55   US BIOPSY (KIDNEY)  Result Date: 05/22/2022 INDICATION: 77 year old female referred for medical renal biopsy EXAM: ULTRASOUND-GUIDED MEDICAL RENAL BIOPSY MEDICATIONS: None. ANESTHESIA/SEDATION: Moderate (conscious) sedation was employed during this procedure. A total of Versed 1.0 mg and Fentanyl 50 mcg was administered intravenously by the radiology nurse. Total intra-service moderate Sedation Time: 10 minutes. The patient's level of consciousness and vital signs were monitored continuously by radiology nursing  throughout the procedure under my direct supervision. COMPLICATIONS: None PROCEDURE: Informed written consent was obtained from the patient after a thorough discussion of the procedural risks, benefits and alternatives. All questions were addressed. Maximal Sterile Barrier Technique was utilized including caps, mask, sterile gowns, sterile gloves, sterile drape, hand hygiene and skin antiseptic. A timeout was performed prior to the initiation of the procedure. Patient was positioned prone position on the gantry table. Images were stored sent to PACs. Once the patient is prepped and draped in the usual sterile fashion, the skin and subcutaneous tissues overlying the left kidney were generously infiltrated 1% lidocaine for local anesthesia. Using ultrasound guidance, a 15 gauge guide needle was advanced into the lower cortex of the left kidney. Once we confirmed location of the needle tip, 2 separate 16 gauge core biopsy were achieved. Two Gel-Foam pledgets were infused with a small amount of saline. The needle was removed. Final images were stored. The patient tolerated the procedure well and remained hemodynamically stable throughout. No complications were encountered and no significant blood loss encountered. IMPRESSION: Status post ultrasound-guided medical renal biopsy Signed, Yvone Neu. Miachel Roux, RPVI [facility] Radiology Electronically Signed   By: Gilmer Mor D.O.   On: 05/22/2022 09:37   ECHOCARDIOGRAM COMPLETE  Result Date: 05/18/2022    ECHOCARDIOGRAM REPORT   Patient Name:   Rita  Myers Date of Exam: 05/18/2022 Medical Rec #:  [id number]   Height:       52.0 in Accession #:    [id number]  Weight:       141.0 lb Date of Birth:  04-Mar-1946  BSA:          1.451 m Patient Age:    76 years    BP:           177/79 mmHg Patient Gender: F           HR:           93 bpm. Exam Location:  Inpatient Procedure: 2D Echo, Cardiac Doppler and Color Doppler  Indications:    Dyspnea R06.00  History:        Patient has prior history of Echocardiogram examinations, most                 recent 11/22/2020. Arrythmias:Atrial Fibrillation,                 Signs/Symptoms:Syncope; Risk Factors:Hypertension, Diabetes and                 Dyslipidemia.  Sonographer:    Lucendia Herrlich Referring Phys: [phone] STEPHEN RANCOUR IMPRESSIONS  1. Left ventricular ejection fraction, by estimation, is 40 to 45%. The left ventricle has mildly decreased function. The left ventricle demonstrates global hypokinesis. Left ventricular diastolic parameters are consistent with Grade II diastolic dysfunction (pseudonormalization). Elevated left atrial pressure.  2. Right ventricular systolic function is normal. The right ventricular size is normal. There is mildly elevated pulmonary artery systolic pressure. The estimated right ventricular systolic pressure is 43.4 mmHg.  3. Left atrial size was mildly dilated.  4. A small pericardial effusion is present.  5. The mitral valve is abnormal. Mild mitral valve regurgitation. Moderate mitral annular calcification.  6. The aortic valve is tricuspid. Aortic valve regurgitation is trivial. Aortic valve sclerosis is present, with no evidence of aortic valve stenosis.  7. The inferior vena cava is normal in size with greater than 50% respiratory variability, suggesting right atrial pressure of 3 mmHg. FINDINGS  Left Ventricle: Left ventricular ejection fraction, by estimation, is 40 to 45%. The left ventricle has mildly decreased function. The left ventricle demonstrates global hypokinesis. The left ventricular internal cavity size was normal in size. There is  no left ventricular hypertrophy. Left ventricular diastolic parameters are consistent with Grade II diastolic dysfunction (pseudonormalization). Elevated left atrial pressure. Right Ventricle: The right ventricular size is normal. No increase in right ventricular wall thickness. Right ventricular systolic  function is normal. There is mildly elevated pulmonary artery systolic pressure. The tricuspid regurgitant velocity is 3.18  m/s, and with an assumed right atrial pressure of 3 mmHg, the estimated right ventricular systolic pressure is 43.4 mmHg. Left Atrium: Left atrial size was mildly dilated. Right Atrium: Right atrial size was normal in size. Pericardium: A small pericardial effusion is present. Mitral Valve: The mitral valve is abnormal. Moderate mitral annular calcification. Mild mitral valve regurgitation. Tricuspid Valve: The tricuspid valve is normal in structure. Tricuspid valve regurgitation is mild. Aortic Valve: The aortic valve is tricuspid. Aortic valve regurgitation is trivial. Aortic regurgitation PHT measures 660 msec. Aortic valve sclerosis is present, with no evidence of aortic valve stenosis. Aortic valve mean gradient measures 5.0 mmHg. Aortic valve peak gradient measures 9.3 mmHg. Aortic valve area, by VTI measures 1.85 cm. Pulmonic Valve: The pulmonic valve was grossly normal. Pulmonic valve regurgitation is trivial. Aorta: The aortic root  and ascending aorta are structurally normal, with no evidence of dilitation. Venous: The inferior vena cava is normal in size with greater than 50% respiratory variability, suggesting right atrial pressure of 3 mmHg. IAS/Shunts: The interatrial septum was not well visualized.  LEFT VENTRICLE PLAX 2D LVIDd:         5.20 cm   Diastology LVIDs:         4.10 cm   LV e' medial:    4.13 cm/s LV PW:         0.90 cm   LV E/e' medial:  23.9 LV IVS:        0.90 cm   LV e' lateral:   4.90 cm/s LVOT diam:     1.90 cm   LV E/e' lateral: 20.1 LV SV:         53 LV SV Index:   37 LVOT Area:     2.84 cm  RIGHT VENTRICLE             IVC RV S prime:     18.10 cm/s  IVC diam: 1.20 cm TAPSE (M-mode): 3.0 cm LEFT ATRIUM           Index        RIGHT ATRIUM           Index LA diam:      4.30 cm 2.96 cm/m   RA Area:     13.20 cm LA Vol (A2C): 31.1 ml 21.44 ml/m  RA Volume:    28.30 ml  19.51 ml/m LA Vol (A4C): 73.6 ml 50.74 ml/m  AORTIC VALVE AV Area (Vmax):    1.87 cm AV Area (Vmean):   1.90 cm AV Area (VTI):     1.85 cm AV Vmax:           152.50 cm/s AV Vmean:          105.350 cm/s AV VTI:            0.286 m AV Peak Grad:      9.3 mmHg AV Mean Grad:      5.0 mmHg LVOT Vmax:         100.80 cm/s LVOT Vmean:        70.567 cm/s LVOT VTI:          0.187 m LVOT/AV VTI ratio: 0.65 AI PHT:            660 msec  AORTA Ao Root diam: 2.70 cm Ao Asc diam:  2.30 cm MITRAL VALVE                TRICUSPID VALVE MV Area (PHT): 3.57 cm     TR Peak grad:   40.4 mmHg MV Decel Time: 213 msec     TR Vmax:        318.00 cm/s MR Peak grad: 173.7 mmHg MR Mean grad: 124.0 mmHg    SHUNTS MR Vmax:      659.00 cm/s   Systemic VTI:  0.19 m MR Vmean:     537.0 cm/s    Systemic Diam: 1.90 cm MV E velocity: 98.65 cm/s MV A velocity: 129.50 cm/s MV E/A ratio:  0.76 Oswaldo Milian MD Electronically signed by Oswaldo Milian MD Signature Date/Time: 05/18/2022/3:51:14 PM    Final    VAS [place] LOWER EXTREMITY VENOUS (DVT) (ONLY [facility] & [facility])  Result Date: 05/18/2022  Lower Venous DVT Study Patient Name:  Rita Myers  Date of Exam:   05/18/2022 Medical Rec #: [id number]  Accession #:    [id number] Date of Birth: 04-16-1946   Patient Gender: F Patient Age:   52 years Exam Location:  [facility] Procedure:      VAS [place] LOWER EXTREMITY VENOUS (DVT) Referring Phys: Glynn Octave --------------------------------------------------------------------------------  Indications: Pain.  Risk Factors: None identified. Limitations: Body habitus, poor ultrasound/tissue interface and patient pain tolerance. Comparison Study: 04/18/2022 - BILATERAL:                   - No evidence of deep vein thrombosis seen in the lower                   extremities,                   bilaterally.                   -                   RIGHT:                   - A cystic structure found in the popliteal fossa.                   - 2.2 x .6 x  2.6 cm fluid collection seen in posterior knee                   fossa.                   Moderate suprapatellar fluid seen and superficial edema noted                   in calf.                    LEFT:                   - Acomplex cystic structure is found in the popliteal fossa.                   - 6.6 x 4.8 x 5.2 cm complex appearing Baker's cyst. Moderate                   to severe                   suprapatellar fluid noted with superficial edema in the calf. Performing Technologist: Chanda Busing RVT  Examination Guidelines: A complete evaluation includes B-mode imaging, spectral Doppler, color Doppler, and power Doppler as needed of all accessible portions of each vessel. Bilateral testing is considered an integral part of a complete examination. Limited examinations for reoccurring indications may be performed as noted. The reflux portion of the exam is performed with the patient in reverse Trendelenburg.  +---------+---------------+---------+-----------+----------+--------------+ RIGHT    CompressibilityPhasicitySpontaneityPropertiesThrombus Aging +---------+---------------+---------+-----------+----------+--------------+ CFV      Full           Yes      Yes                                 +---------+---------------+---------+-----------+----------+--------------+ SFJ      Full                                                        +---------+---------------+---------+-----------+----------+--------------+  FV Prox  Full                                                        +---------+---------------+---------+-----------+----------+--------------+ FV Mid   Full                                                        +---------+---------------+---------+-----------+----------+--------------+ FV DistalFull                                                        +---------+---------------+---------+-----------+----------+--------------+ PFV      Full                                                         +---------+---------------+---------+-----------+----------+--------------+ POP      Full           Yes      Yes                                 +---------+---------------+---------+-----------+----------+--------------+ PTV      Full                                                        +---------+---------------+---------+-----------+----------+--------------+ PERO     Full                                                        +---------+---------------+---------+-----------+----------+--------------+   +---------+---------------+---------+-----------+----------+--------------+ LEFT     CompressibilityPhasicitySpontaneityPropertiesThrombus Aging +---------+---------------+---------+-----------+----------+--------------+ CFV      Full           Yes      Yes                                 +---------+---------------+---------+-----------+----------+--------------+ SFJ      Full                                                        +---------+---------------+---------+-----------+----------+--------------+ FV Prox  Full                                                        +---------+---------------+---------+-----------+----------+--------------+  FV Mid                  Yes      Yes                                 +---------+---------------+---------+-----------+----------+--------------+ FV Distal               Yes      Yes                                 +---------+---------------+---------+-----------+----------+--------------+ PFV      Full                                                        +---------+---------------+---------+-----------+----------+--------------+ POP      Full           Yes      Yes                                 +---------+---------------+---------+-----------+----------+--------------+ PTV      Full                                                         +---------+---------------+---------+-----------+----------+--------------+ PERO     Full                                                        +---------+---------------+---------+-----------+----------+--------------+     Summary: RIGHT: - There is no evidence of deep vein thrombosis in the lower extremity. However, portions of this examination were limited- see technologist comments above.  - No cystic structure found in the popliteal fossa.  LEFT: - There is no evidence of deep vein thrombosis in the lower extremity. However, portions of this examination were limited- see technologist comments above.  - No cystic structure found in the popliteal fossa.  *See table(s) above for measurements and observations. Electronically signed by Sherald Hess MD on 05/18/2022 at 3:30:20 PM.    Final    DG Chest 2 View  Result Date: 05/17/2022 CLINICAL DATA:  Edema in the lower extremities EXAM: CHEST - 2 VIEW COMPARISON:  07/11/2017 FINDINGS: Transverse diameter of heart is increased. Pulmonary vascularity is unremarkable. There are no signs of pulmonary edema or focal pulmonary consolidation. There is blunting of both lateral CP angles. There is no pneumothorax. IMPRESSION: Cardiomegaly. There are no signs of pulmonary edema or focal pulmonary consolidation. Small bilateral pleural effusions. Electronically Signed   By: Ernie Avena M.D.   On: 05/17/2022 19:53   MR LUMBAR SPINE WO CONTRAST  Result Date: 05/16/2022 CLINICAL DATA:  Low back pain with bilateral radiculopathy EXAM: MRI LUMBAR SPINE WITHOUT CONTRAST TECHNIQUE: Multiplanar, multisequence MR imaging of the lumbar spine was performed. No intravenous contrast was administered. COMPARISON:  None Available. FINDINGS: Segmentation:  Standard. Alignment:  Grade 1 anterolisthesis of L5 on S1. Vertebrae:  No fracture, evidence of discitis, or bone lesion. Conus medullaris and cauda equina: Conus extends to the L1 level. Conus and cauda equina appear  normal. Paraspinal and other soft tissues: Multifibroid uterus. Disc levels: T12-L1: Annular disc bulge and endplate spurring. No foraminal or canal stenosis. L1-L2: Minimal disc bulge.  No foraminal or canal stenosis. L2-L3: Mild annular disc bulge. Mild bilateral facet hypertrophy. No foraminal or canal stenosis. L3-L4: Annular disc bulge with mild bilateral facet hypertrophy and ligamentum flavum buckling. Mild canal stenosis with left greater than right subarticular recess stenosis. Mild-moderate right and mild left foraminal stenosis. L4-L5: Diffuse disc bulge with mild-moderate bilateral facet arthropathy and ligamentum flavum buckling. Severe canal stenosis with mild-to-moderate bilateral foraminal stenosis. L5-S1: Diffuse disc bulge with moderate bilateral facet arthropathy. No canal stenosis. Moderate right and mild left foraminal stenosis. IMPRESSION: 1. Multilevel lumbar spondylosis, most pronounced at L4-5 where there is severe canal stenosis and mild-to-moderate bilateral foraminal stenosis. 2. Moderate right and mild left foraminal stenosis at L5-S1. 3. Mild canal stenosis at L3-L4 with left greater than right subarticular recess stenosis. 4. Multifibroid uterus. Electronically Signed   By: Duanne Guess D.O.   On: 05/16/2022 10:12    Microbiology: Recent Results (from the past 240 hour(s))  MRSA Next Gen by PCR, Nasal     Status: None   Collection Time: 05/19/22  1:23 AM   Specimen: Nasal Mucosa; Nasal Swab  Result Value Ref Range Status   MRSA by PCR Next Gen NOT DETECTED NOT DETECTED Final    Comment: (NOTE) The GeneXpert MRSA Assay (FDA approved for NASAL specimens only), is one component of a comprehensive MRSA colonization surveillance program. It is not intended to diagnose MRSA infection nor to guide or monitor treatment for MRSA infections. Test performance is not FDA approved in patients less than 72 years old. Performed at [facility], [address],  [place], [place] [address]   Resp panel by RT-PCR (RSV, Flu A&B, Covid) Anterior Nasal Swab     Status: None   Collection Time: 05/23/22  7:41 AM   Specimen: Anterior Nasal Swab  Result Value Ref Range Status   SARS Coronavirus 2 by RT PCR NEGATIVE NEGATIVE Final    Comment: (NOTE) SARS-CoV-2 target nucleic acids are NOT DETECTED.  The SARS-CoV-2 RNA is generally detectable in upper respiratory specimens during the acute phase of infection. The lowest concentration of SARS-CoV-2 viral copies this assay can detect is 138 copies/mL. A negative result does not preclude SARS-Cov-2 infection and should not be used as the sole basis for treatment or other patient management decisions. A negative result may occur with  improper specimen collection/handling, submission of specimen other than nasopharyngeal swab, presence of viral mutation(s) within the areas targeted by this assay, and inadequate number of viral copies(<138 copies/mL). A negative result must be combined with clinical observations, patient history, and epidemiological information. The expected result is Negative.  Fact Sheet for Patients:  BloggerCourse.com  Fact Sheet for Healthcare Providers:  SeriousBroker.it  This test is no t yet approved or cleared by the [place] FDA and  has been authorized for detection and/or diagnosis of SARS-CoV-2 by FDA under an Emergency Use Authorization (EUA). This EUA will remain  in effect (meaning this test can be used) for the duration of the COVID-19 declaration under Section 564(b)(1) of the Act, 21 U.S.C.section 360bbb-3(b)(1), unless the authorization is terminated  or revoked sooner.  Influenza A by PCR NEGATIVE NEGATIVE Final   Influenza B by PCR NEGATIVE NEGATIVE Final    Comment: (NOTE) The Xpert Xpress SARS-CoV-2/FLU/RSV plus assay is intended as an aid in the diagnosis of influenza from Nasopharyngeal swab specimens  and should not be used as a sole basis for treatment. Nasal washings and aspirates are unacceptable for Xpert Xpress SARS-CoV-2/FLU/RSV testing.  Fact Sheet for Patients: BloggerCourse.com  Fact Sheet for Healthcare Providers: SeriousBroker.it  This test is not yet approved or cleared by the [place] FDA and has been authorized for detection and/or diagnosis of SARS-CoV-2 by FDA under an Emergency Use Authorization (EUA). This EUA will remain in effect (meaning this test can be used) for the duration of the COVID-19 declaration under Section 564(b)(1) of the Act, 21 U.S.C. section 360bbb-3(b)(1), unless the authorization is terminated or revoked.     Resp Syncytial Virus by PCR NEGATIVE NEGATIVE Final    Comment: (NOTE) Fact Sheet for Patients: BloggerCourse.com  Fact Sheet for Healthcare Providers: SeriousBroker.it  This test is not yet approved or cleared by the [place] FDA and has been authorized for detection and/or diagnosis of SARS-CoV-2 by FDA under an Emergency Use Authorization (EUA). This EUA will remain in effect (meaning this test can be used) for the duration of the COVID-19 declaration under Section 564(b)(1) of the Act, 21 U.S.C. section 360bbb-3(b)(1), unless the authorization is terminated or revoked.  Performed at [facility], [address] [place], [place] [address]      Labs: Basic Metabolic Panel: Recent Labs  Lab 05/19/22 0055 05/20/22 0033 05/21/22 0026 05/22/22 0018 05/23/22 0042  NA 140 137 138 137 137  K 4.2 4.0 3.5 3.5 4.0  CL 107 105 104 103 102  CO2 27 28 27 27 25   GLUCOSE 97 120* 99 100* 112*  BUN 28* 34* 38* 39* 41*  CREATININE 0.96 1.00 1.01* 0.87 0.92  CALCIUM 8.1* 8.0* 8.3* 8.0* 8.0*  MG 1.9  --   --   --   --    Liver Function Tests: Recent Labs  Lab 05/17/22 1927 05/20/22 0033  AST 43* 30  ALT 36 24   ALKPHOS 50 39  BILITOT 0.2* <0.1*  PROT 5.4* 4.4*  ALBUMIN 1.7* <1.5*   No results for input(s): "LIPASE", "AMYLASE" in the last 168 hours. No results for input(s): "AMMONIA" in the last 168 hours. CBC: Recent Labs  Lab 05/17/22 1927 05/19/22 0055  WBC 7.3 6.7  NEUTROABS 4.1  --   HGB 13.8 11.4*  HCT 42.6 33.1*  MCV 94.5 90.4  PLT 199 160   Cardiac Enzymes: No results for input(s): "CKTOTAL", "CKMB", "CKMBINDEX", "TROPONINI" in the last 168 hours. BNP: BNP (last 3 results) Recent Labs    05/17/22 1927  BNP 283.2*    ProBNP (last 3 results) Recent Labs    04/18/22 0941  PROBNP 717    CBG: No results for input(s): "GLUCAP" in the last 168 hours.     Signed:  14/05/23 MD.  [facility] 05/23/2022, 5:40 PM

## 2022-05-23 NOTE — Progress Notes (Signed)
Physical Therapy Treatment Patient Details Name: Rita Myers MRN: [id number] DOB: Jul 19, 1945 Today's Date: 05/23/2022   History of Present Illness Pt is 77 year old presented to [facility] on  05/18/22 with progressive lower extremity edema, SOB, weight gain. Pt with acute on chronic systolic heart failure and suspected nephrotic syndrome. PMH - afib, htn    PT Comments    Patient progressing well towards PT goals. Session focused on gait training with new post op shoe. Requires Min guard-supervision for all mobility using rollator. States wearing the shoe makes her knee feel better but continues to report pain in left knee with walking/mobility. Noted to have some swelling and warmth present. Needs to negotiate stairs to enter home so plan to perform stair training next session. Daughter visiting from [place] to stay for a few weeks to support pt when she is discharged. Will follow.    Recommendations for follow up therapy are one component of a multi-disciplinary discharge planning process, led by the attending physician.  Recommendations may be updated based on patient status, additional functional criteria and insurance authorization.  Follow Up Recommendations  No PT follow up     Assistance Recommended at Discharge PRN  Patient can return home with the following Help with stairs or ramp for entrance;Assistance with cooking/housework   Equipment Recommendations  None recommended by PT    Recommendations for Other Services       Precautions / Restrictions Precautions Precautions: Fall Required Braces or Orthoses: Other Brace Other Brace: has post op shoe for comfort Restrictions Weight Bearing Restrictions: No     Mobility  Bed Mobility Overal bed mobility: Needs Assistance Bed Mobility: Sit to Supine       Sit to supine: Modified independent (Device/Increase time), HOB elevated   General bed mobility comments: Up in bathroom upon PT arrival.    Transfers Overall  transfer level: Needs assistance Equipment used: Rollator (4 wheels) Transfers: Sit to/from Stand Sit to Stand: Supervision           General transfer comment: Supervision for safety. Stood from toilet x1.    Ambulation/Gait Ambulation/Gait assistance: Min guard Gait Distance (Feet): 150 Feet Assistive device: Rollator (4 wheels) Gait Pattern/deviations: Step-through pattern, Decreased step length - right, Decreased stance time - left, Antalgic Gait velocity: decr Gait velocity interpretation: <1.31 ft/sec, indicative of household ambulator   General Gait Details: Slow, antalgic like gait with use of rollator for support, had her donn her other shoe to decrease step height during ambulation.   Stairs             Wheelchair Mobility    Modified Rankin (Stroke Patients Only)       Balance Overall balance assessment: Needs assistance Sitting-balance support: Feet supported, No upper extremity supported Sitting balance-Leahy Scale: Good Sitting balance - Comments: Able to perform pericare without difficulty.   Standing balance support: During functional activity Standing balance-Leahy Scale: Fair Standing balance comment: Able to stand at sink and wash hands reaching outside BoS without difficulty.                            Cognition Arousal/Alertness: Awake/alert Behavior During Therapy: WFL for tasks assessed/performed Overall Cognitive Status: Within Functional Limits for tasks assessed  Exercises      General Comments General comments (skin integrity, edema, etc.): daughter just arrived, visiting from [place].      Pertinent Vitals/Pain Pain Assessment Pain Assessment: Faces Faces Pain Scale: Hurts even more Pain Location: lt knee with mobility Pain Descriptors / Indicators: Guarding, Grimacing, Sore Pain Intervention(s): Monitored during session, Repositioned, Limited activity  within patient's tolerance    Home Living                          Prior Function            PT Goals (current goals can now be found in the care plan section) Progress towards PT goals: Progressing toward goals    Frequency    Min 3X/week      PT Plan Current plan remains appropriate    Co-evaluation              AM-PAC PT "6 Clicks" Mobility   Outcome Measure  Help needed turning from your back to your side while in a flat bed without using bedrails?: None Help needed moving from lying on your back to sitting on the side of a flat bed without using bedrails?: None Help needed moving to and from a bed to a chair (including a wheelchair)?: A Little Help needed standing up from a chair using your arms (e.g., wheelchair or bedside chair)?: A Little Help needed to walk in hospital room?: A Little Help needed climbing 3-5 steps with a railing? : A Little 6 Click Score: 20    End of Session Equipment Utilized During Treatment: Gait belt Activity Tolerance: Patient tolerated treatment well Patient left: in bed;with call bell/phone within reach;with family/visitor present Nurse Communication: Mobility status PT Visit Diagnosis: Other abnormalities of gait and mobility (R26.89);Pain Pain - Right/Left: Left Pain - part of body: Knee     Time: 1324-4010 PT Time Calculation (min) (ACUTE ONLY): 15 min  Charges:  $Gait Training: 8-22 mins                     Vale Haven, PT, DPT Acute Rehabilitation Services Secure chat preferred Office [phone]      Blake Divine A Terriann Difonzo 05/23/2022, 1:14 PM

## 2022-05-23 NOTE — Progress Notes (Signed)
Patient ID: Rita Myers, female   DOB: [birth year], 77 y.o.   MRN: [id number] [facility] Progress Note   Assessment/ Plan:   1.  Nephrotic syndrome: Unrevealing serologies to indicate etiology of her nephrotic syndrome and underwent renal biopsy on 1/8 (sample sent to [place] labs).  Continues to diurese well (weights from overnight not reflective of fluid balance on I/O). May potentially DC home today if respiratory viral panel unrevealing- she has an OP f/u with Dr. Joelyn Oms at [facility].  2.  Atrial fibrillation: Rate controlled and with apixaban currently on hold for renal biopsy -plans to restart on 1/10. 3.  Hypertension: Well-controlled on ARB-losartan that also helps with her proteinuria.  4.  Anemia: Mild and without overt loss, will continue to monitor trend.  SPEP without M spike and kappa light chains elevated with high K;L ratio- await biopsy report to see if this is MGRS.  Subjective:   No hematuria overnight. Concerned about a persisting cough overnight.    Objective:   BP (!) 145/75 (BP Location: Left Arm)   Pulse 82   Temp 97.8 F (36.6 C) (Oral)   Resp 20   Ht 4\' 2"  (1.27 m)   Wt 63.3 kg   SpO2 90%   BMI 39.25 kg/m   Intake/Output Summary (Last 24 hours) at 05/23/2022 0754 Last data filed at 05/23/2022 0409 Gross per 24 hour  Intake 243 ml  Output 1725 ml  Net -1482 ml   Weight change: 0.3 kg  Physical Exam: Gen: Comfortably resting in bed, daughter at bedside CVS: Pulse regular rhythm, normal rate, S1 and S2 normal Resp: Fine rales over bilateral lung bases Abd: Soft, flat, nontender, bowel sounds normal Ext: 2+ bilateral lower extremity edema without erythema/tenderness  Imaging: US BIOPSY (KIDNEY)  Result Date: 05/22/2022 INDICATION: 77 year old female referred for medical renal biopsy EXAM: ULTRASOUND-GUIDED MEDICAL RENAL BIOPSY MEDICATIONS: None. ANESTHESIA/SEDATION: Moderate (conscious) sedation was employed during this procedure. A total of Versed  1.0 mg and Fentanyl 50 mcg was administered intravenously by the radiology nurse. Total intra-service moderate Sedation Time: 10 minutes. The patient's level of consciousness and vital signs were monitored continuously by radiology nursing throughout the procedure under my direct supervision. COMPLICATIONS: None PROCEDURE: Informed written consent was obtained from the patient after a thorough discussion of the procedural risks, benefits and alternatives. All questions were addressed. Maximal Sterile Barrier Technique was utilized including caps, mask, sterile gowns, sterile gloves, sterile drape, hand hygiene and skin antiseptic. A timeout was performed prior to the initiation of the procedure. Patient was positioned prone position on the gantry table. Images were stored sent to PACs. Once the patient is prepped and draped in the usual sterile fashion, the skin and subcutaneous tissues overlying the left kidney were generously infiltrated 1% lidocaine for local anesthesia. Using ultrasound guidance, a 15 gauge guide needle was advanced into the lower cortex of the left kidney. Once we confirmed location of the needle tip, 2 separate 16 gauge core biopsy were achieved. Two Gel-Foam pledgets were infused with a small amount of saline. The needle was removed. Final images were stored. The patient tolerated the procedure well and remained hemodynamically stable throughout. No complications were encountered and no significant blood loss encountered. IMPRESSION: Status post ultrasound-guided medical renal biopsy Signed, Dulcy Fanny. Nadene Rubins, RPVI [facility] Electronically Signed   By: Corrie Mckusick D.O.   On: 05/22/2022 09:37    Labs: BMET Recent Labs  Lab 05/17/22 1927 05/19/22  [id number] 05/20/22 0033 05/21/22 0026 05/22/22 0018 05/23/22 0042  NA 137 140 137 138 137 137  K 3.9 4.2 4.0 3.5 3.5 4.0  CL 104 107 105 104 103 102  CO2 26 27 28 27 27 25    GLUCOSE 95 97 120* 99 100* 112*  BUN 23 28* 34* 38* 39* 41*  CREATININE 0.83 0.96 1.00 1.01* 0.87 0.92  CALCIUM 9.0 8.1* 8.0* 8.3* 8.0* 8.0*   CBC Recent Labs  Lab 05/17/22 1927 05/19/22 0055  WBC 7.3 6.7  NEUTROABS 4.1  --   HGB 13.8 11.4*  HCT 42.6 33.1*  MCV 94.5 90.4  PLT 199 160    Medications:     diclofenac Sodium  2 g Topical QID   gabapentin  300 mg Oral BID   gelatin adsorbable  1 each Topical Once   levothyroxine  37.5 mcg Oral q morning   losartan  50 mg Oral Daily   metoprolol succinate  25 mg Oral Daily   rosuvastatin  10 mg Oral Daily   sodium chloride flush  3 mL Intravenous Q12H   torsemide  40 mg Oral BID    07/18/22, MD 05/23/2022, 7:54 AM

## 2022-05-23 NOTE — Progress Notes (Signed)
PROGRESS NOTE    Rita Myers  KGU:[id number] DOB: 1945-12-13 DOA: 05/17/2022 PCP: Darrow Bussing, MD  77/F with history of paroxysmal A-fib on Eliquis, hypertension, dyslipidemia presented to the ED with progressive lower extremity edema X 1 month, recent shortness of breath and weight gain, recently started on Lasix. -In the ED blood pressure 158/69, creatinine 0.8, albumin 1.7, BNP 283, chest x-ray noted cardiomegaly and small bilateral effusions, UA with 3+ proteinuria -Echo EF 40-45%, RV normal -Diagnosed with nephrotic syndrome, started on diuretics -1/8: Underwent kidney biopsy  Subjective: -Daughter at bedside, complains about new cough congestion, URI, has concerns about patient going home today  Assessment and Plan:  Nephrotic syndrome, new diagnosis with profound proteinuria, hypoalbuminemia -Echo with EF of 40-45% as well -Appreciate nephrology input, SPEP unremarkable, immunofixation with mildly elevated kappa light chains and ratio, await kidney biopsy, this was completed yesterday -Continue to hold apixaban 1 more day, resume tomorrow -Diuresed well with IV Lasix, she is 9.2 L negative, now on oral torsemide -Discharge planning  Acute on chronic systolic CHF -Echo with EF down to 40-45% -As above, diuretics changed to torsemide, additional workup as above for nephrotic syndrome -Continue losartan  Cough, URI -Check flu/COVID/RSV, x-ray  Paroxysmal A-fib -Continue Toprol, hold apixaban, resume tomorrow  Left knee pain -Acute on chronic, worsened in the setting of edema, suspect underlying osteoarthritis -Try postop boot, Voltaren gel, slowly improving,  Hypothyroidism -Continue Synthroid   DVT prophylaxis: Holding apixaban Code Status: Full code Family Communication: Spouse at bedside Disposition Plan: Home later today or in a.m.  Consultants: Cards, nephrology   Procedures:   Antimicrobials:    Objective: Vitals:   05/22/22 1932 05/22/22 2332  05/23/22 0408 05/23/22 0812  BP: (!) 143/77 (!) 143/67 (!) 145/75 (!) 149/64  Pulse: 76 95 82 85  Resp: 13 18 20 18   Temp: 98.5 F (36.9 C) 97.6 F (36.4 C) 97.8 F (36.6 C) 97.6 F (36.4 C)  TempSrc: Oral Oral Oral Axillary  SpO2: 98% 96% 90% 95%  Weight:   63.3 kg   Height:        Intake/Output Summary (Last 24 hours) at 05/23/2022 0944 Last data filed at 05/23/2022 0800 Gross per 24 hour  Intake 363 ml  Output 1725 ml  Net -1362 ml   Filed Weights   05/21/22 0435 05/22/22 0611 05/23/22 0408  Weight: 63.9 kg 63 kg 63.3 kg    Examination:  General exam: Pleasant female sitting up in bed, AAOx3, no distress HEENT: No JVD 's S: S1-S2, regular rhythm Lungs: Clear bilaterally Abdomen: Soft, nontender, bowel sounds present Extremities: Trace edema Skin: No rashes Psychiatry:  Mood & affect appropriate.     Data Reviewed:   CBC: Recent Labs  Lab 05/17/22 1927 05/19/22 0055  WBC 7.3 6.7  NEUTROABS 4.1  --   HGB 13.8 11.4*  HCT 42.6 33.1*  MCV 94.5 90.4  PLT 199 160   Basic Metabolic Panel: Recent Labs  Lab 05/19/22 0055 05/20/22 0033 05/21/22 0026 05/22/22 0018 05/23/22 0042  NA 140 137 138 137 137  K 4.2 4.0 3.5 3.5 4.0  CL 107 105 104 103 102  CO2 27 28 27 27 25   GLUCOSE 97 120* 99 100* 112*  BUN 28* 34* 38* 39* 41*  CREATININE 0.96 1.00 1.01* 0.87 0.92  CALCIUM 8.1* 8.0* 8.3* 8.0* 8.0*  MG 1.9  --   --   --   --    GFR: Estimated Creatinine Clearance: 30.9 mL/min (by C-G formula  based on SCr of 0.92 mg/dL). Liver Function Tests: Recent Labs  Lab 05/17/22 1927 05/20/22 0033  AST 43* 30  ALT 36 24  ALKPHOS 50 39  BILITOT 0.2* <0.1*  PROT 5.4* 4.4*  ALBUMIN 1.7* <1.5*   No results for input(s): "LIPASE", "AMYLASE" in the last 168 hours. No results for input(s): "AMMONIA" in the last 168 hours. Coagulation Profile: Recent Labs  Lab 05/22/22 0018  INR 1.1   Cardiac Enzymes: No results for input(s): "CKTOTAL", "CKMB", "CKMBINDEX",  "TROPONINI" in the last 168 hours. BNP (last 3 results) Recent Labs    04/18/22 0941  PROBNP 717   HbA1C: No results for input(s): "HGBA1C" in the last 72 hours.  CBG: No results for input(s): "GLUCAP" in the last 168 hours. Lipid Profile: No results for input(s): "CHOL", "HDL", "LDLCALC", "TRIG", "CHOLHDL", "LDLDIRECT" in the last 72 hours.  Thyroid Function Tests: No results for input(s): "TSH", "T4TOTAL", "FREET4", "T3FREE", "THYROIDAB" in the last 72 hours.  Anemia Panel: No results for input(s): "VITAMINB12", "FOLATE", "FERRITIN", "TIBC", "IRON", "RETICCTPCT" in the last 72 hours. Urine analysis:    Component Value Date/Time   COLORURINE YELLOW 05/18/2022 1406   APPEARANCEUR HAZY (A) 05/18/2022 1406   LABSPEC 1.016 05/18/2022 1406   PHURINE 6.0 05/18/2022 1406   GLUCOSEU NEGATIVE 05/18/2022 1406   HGBUR SMALL (A) 05/18/2022 1406   BILIRUBINUR NEGATIVE 05/18/2022 1406   KETONESUR NEGATIVE 05/18/2022 1406   PROTEINUR >=300 (A) 05/18/2022 1406   NITRITE NEGATIVE 05/18/2022 1406   LEUKOCYTESUR NEGATIVE 05/18/2022 1406   Sepsis Labs: @LABRCNTIP (procalcitonin:4,lacticidven:4)  ) Recent Results (from the past 240 hour(s))  MRSA Next Gen by PCR, Nasal     Status: None   Collection Time: 05/19/22  1:23 AM   Specimen: Nasal Mucosa; Nasal Swab  Result Value Ref Range Status   MRSA by PCR Next Gen NOT DETECTED NOT DETECTED Final    Comment: (NOTE) The GeneXpert MRSA Assay (FDA approved for NASAL specimens only), is one component of a comprehensive MRSA colonization surveillance program. It is not intended to diagnose MRSA infection nor to guide or monitor treatment for MRSA infections. Test performance is not FDA approved in patients less than 46 years old. Performed at [facility], [place] [address] [place], [place] [address]      Radiology Studies: DG CHEST PORT 1 VIEW  Result Date: 05/23/2022 CLINICAL DATA:  Cough-DROPLET PRECAUTIONS Tamara-[id number] Cough  10031 EXAM: PORTABLE CHEST 1 VIEW COMPARISON:  None Available. FINDINGS: Normal mediastinum and cardiac silhouette. Normal pulmonary vasculature. No evidence of effusion, infiltrate, or pneumothorax. No acute bony abnormality. IMPRESSION: No acute cardiopulmonary process. Electronically Signed   By: Suzy Bouchard M.D.   On: 05/23/2022 08:55   US BIOPSY (KIDNEY)  Result Date: 05/22/2022 INDICATION: 77 year old female referred for medical renal biopsy EXAM: ULTRASOUND-GUIDED MEDICAL RENAL BIOPSY MEDICATIONS: None. ANESTHESIA/SEDATION: Moderate (conscious) sedation was employed during this procedure. A total of Versed 1.0 mg and Fentanyl 50 mcg was administered intravenously by the radiology nurse. Total intra-service moderate Sedation Time: 10 minutes. The patient's level of consciousness and vital signs were monitored continuously by radiology nursing throughout the procedure under my direct supervision. COMPLICATIONS: None PROCEDURE: Informed written consent was obtained from the patient after a thorough discussion of the procedural risks, benefits and alternatives. All questions were addressed. Maximal Sterile Barrier Technique was utilized including caps, mask, sterile gowns, sterile gloves, sterile drape, hand hygiene and skin antiseptic. A timeout was performed prior to the initiation of the procedure. Patient was  positioned prone position on the gantry table. Images were stored sent to PACs. Once the patient is prepped and draped in the usual sterile fashion, the skin and subcutaneous tissues overlying the left kidney were generously infiltrated 1% lidocaine for local anesthesia. Using ultrasound guidance, a 15 gauge guide needle was advanced into the lower cortex of the left kidney. Once we confirmed location of the needle tip, 2 separate 16 gauge core biopsy were achieved. Two Gel-Foam pledgets were infused with a small amount of saline. The needle was removed. Final images were stored. The patient  tolerated the procedure well and remained hemodynamically stable throughout. No complications were encountered and no significant blood loss encountered. IMPRESSION: Status post ultrasound-guided medical renal biopsy Signed, Yvone Neu. Miachel Roux, RPVI [facility] Radiology Electronically Signed   By: Gilmer Mor D.O.   On: 05/22/2022 09:37     Scheduled Meds:  diclofenac Sodium  2 g Topical QID   gabapentin  300 mg Oral BID   gelatin adsorbable  1 each Topical Once   levothyroxine  37.5 mcg Oral q morning   losartan  50 mg Oral Daily   metoprolol succinate  25 mg Oral Daily   rosuvastatin  10 mg Oral Daily   sodium chloride flush  3 mL Intravenous Q12H   torsemide  40 mg Oral BID   Continuous Infusions:   LOS: 5 days    Time spent:    Zannie Cove, MD [facility]   05/23/2022, 9:44 AM

## 2022-05-24 ENCOUNTER — Inpatient Hospital Stay (HOSPITAL_COMMUNITY): Payer: Medicare Other

## 2022-05-24 ENCOUNTER — Encounter: Payer: Medicare Other | Admitting: Physical Therapy

## 2022-05-24 ENCOUNTER — Other Ambulatory Visit (HOSPITAL_COMMUNITY): Payer: Self-pay

## 2022-05-24 LAB — PROTEIN ELECTROPHORESIS, SERUM
A/G Ratio: 0.6 — ABNORMAL LOW (ref 0.7–1.7)
Albumin ELP: 1.6 g/dL — ABNORMAL LOW (ref 2.9–4.4)
Alpha-1-Globulin: 0.2 g/dL (ref 0.0–0.4)
Alpha-2-Globulin: 1.2 g/dL — ABNORMAL HIGH (ref 0.4–1.0)
Beta Globulin: 0.9 g/dL (ref 0.7–1.3)
Gamma Globulin: 0.5 g/dL (ref 0.4–1.8)
Globulin, Total: 2.8 g/dL (ref 2.2–3.9)
Total Protein ELP: 4.4 g/dL — ABNORMAL LOW (ref 6.0–8.5)

## 2022-05-24 LAB — BASIC METABOLIC PANEL
Anion gap: 8 (ref 5–15)
BUN: 37 mg/dL — ABNORMAL HIGH (ref 8–23)
CO2: 27 mmol/L (ref 22–32)
Calcium: 7.9 mg/dL — ABNORMAL LOW (ref 8.9–10.3)
Chloride: 102 mmol/L (ref 98–111)
Creatinine, Ser: 0.85 mg/dL (ref 0.44–1.00)
GFR, Estimated: >60 mL/min (ref >60)
Glucose, Bld: 95 mg/dL (ref 70–99)
Potassium: 3.7 mmol/L (ref 3.5–5.1)
Sodium: 137 mmol/L (ref 135–145)

## 2022-05-24 LAB — MULTIPLE MYELOMA PANEL, SERUM
Albumin SerPl Elph-Mcnc: 1.5 g/dL — ABNORMAL LOW (ref 2.9–4.4)
Albumin/Glob SerPl: 0.7 (ref 0.7–1.7)
Alpha 1: 0.2 g/dL (ref 0.0–0.4)
Alpha2 Glob SerPl Elph-Mcnc: 1.1 g/dL — ABNORMAL HIGH (ref 0.4–1.0)
B-Globulin SerPl Elph-Mcnc: 0.7 g/dL (ref 0.7–1.3)
Gamma Glob SerPl Elph-Mcnc: 0.5 g/dL (ref 0.4–1.8)
Globulin, Total: 2.5 g/dL (ref 2.2–3.9)
IgA: 367 mg/dL (ref 64–422)
IgG (Immunoglobin G), Serum: 653 mg/dL (ref 586–1602)
IgM (Immunoglobulin M), Srm: 242 mg/dL — ABNORMAL HIGH (ref 26–217)
Total Protein ELP: 4 g/dL — ABNORMAL LOW (ref 6.0–8.5)

## 2022-05-24 LAB — ANA W/REFLEX IF POSITIVE: Anti Nuclear Antibody (ANA): NEGATIVE

## 2022-05-24 MED ORDER — ROSUVASTATIN CALCIUM 10 MG PO TABS
10.0000 mg | ORAL_TABLET | Freq: Every day | ORAL | 0 refills | Status: DC
  Filled 2022-05-24: qty 30, 30d supply, fill #0

## 2022-05-24 MED ORDER — POTASSIUM CHLORIDE CRYS ER 20 MEQ PO TBCR
40.0000 meq | EXTENDED_RELEASE_TABLET | Freq: Once | ORAL | Status: AC
  Administered 2022-05-24: 40 meq via ORAL
  Filled 2022-05-24: qty 2

## 2022-05-24 NOTE — Progress Notes (Signed)
Complained of feeling dizzy after ambulating along the hallway, assisted back to bed , [id number] systolic. Denied any further dizziness after few min. Continue to monitor.

## 2022-05-24 NOTE — Progress Notes (Signed)
Mobility Specialist Progress Note    05/24/22 0919  Mobility  Activity Ambulated with assistance in hallway  Level of Assistance Contact guard assist, steadying assist  Assistive Device Four wheel walker  Distance Ambulated (ft) 180 ft  Activity Response Tolerated fair  Mobility Referral Yes  $Mobility charge 1 Mobility   Pre-Mobility: 86 HR, 97% SpO2 During Mobility: 107 HR Post-Mobility: 91 HR, 96% SpO2  Pt received in bed and agreeable. C/o LLE pain. Had antalgic gait throughout. Returned to bed with call bell in reach. RN present.    Hildred Alamin Mobility Specialist  Please Psychologist, sport and exercise or Rehab Office at [phone]

## 2022-05-24 NOTE — Discharge Summary (Signed)
Physician Discharge Summary  Rita Myers ZOX:[id number] DOB: February 10, 1946 DOA: 05/17/2022  PCP: Darrow BussingKoirala, Dibas, MD  Admit date: 05/17/2022 Discharge date: 05/24/2022  Time spent: 40 minutes  Recommendations for Outpatient Follow-up:  Follow outpatient CBC/CMP  Follow renal outpatient, follow pending biopsy Follow volume status Follow with cardiology outpatient for HF/SVT Follow with orthopedics, L knee pain Repeat TSH outpatient in Rita Myers few weeks Follow LDL, transitioned to crestor    Discharge Diagnoses:  Principal Problem:   Swelling of both lower extremities Active Problems:   Heart failure with reduced ejection fraction (HCC)   Paroxysmal atrial fibrillation (HCC)   Essential hypertension   Prediabetes   Hypothyroidism   Chronic anticoagulation   Proteinuria   Bilateral lower extremity edema   Hypoalbuminemia   Nephrotic syndrome   Cardiomyopathy [facility](HCC)   Discharge Condition: stable  Diet recommendation: heart healthy  Filed Weights   05/22/22 0611 05/23/22 0408 05/24/22 0516  Weight: 63 kg 63.3 kg 61.9 kg    History of present illness:  77/F with history of paroxysmal Rita Myers-fib on Eliquis, hypertension, dyslipidemia presented to the ED with progressive lower extremity edema X 1 month, recent shortness of breath and weight gain, recently started on Lasix. -In the ED blood pressure 158/69, creatinine 0.8, albumin 1.7, BNP 283, chest x-ray noted cardiomegaly and small bilateral effusions, UA with 3+ proteinuria  She was found to have nephrotic syndrome.  Now s/p renal bx.  Pending at time of discharge.  See below for additional details  Hospital Course:  Assessment and Plan: Nephrotic syndrome, new diagnosis with profound proteinuria, hypoalbuminemia -Echo with EF of 40-45% as well -Appreciate nephrology input, SPEP unremarkable, immunofixation with mildly elevated kappa light chains and ratio, await kidney biopsy, this was completed yesterday -Continue to hold apixaban 1  more day, resume today -Diuresed well with IV Lasix, she is 9.2 L negative, now on oral torsemide -Discharge planning   Acute on chronic systolic CHF -Echo with EF down to 40-45% -As above, diuretics changed to torsemide, additional workup as above for nephrotic syndrome -Continue losartan, metoprolol -consider cardiac CTA per cards   Cough, URI -CXR wnl -negative covid/flu/RSV   Paroxysmal [name]-fib -Continue Toprol, hold apixaban, resume 1/10  SVT - had run of SVT on tele - continue metop - follow with cards outpatient    Left knee pain  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 Left ventricular ejection fraction, by estimation, is 40 to 45%. The  left ventricle has mildly decreased function. The left ventricle  demonstrates global hypokinesis. Left ventricular diastolic parameters are  consistent with Grade II diastolic  dysfunction (pseudonormalization). Elevated left atrial pressure.   2. Right ventricular systolic function is normal. The right ventricular  size is normal. There is mildly elevated pulmonary artery systolic  pressure. The estimated right ventricular systolic pressure is 43.4 mmHg.   3. Left atrial size was mildly dilated.   4. Erikka Follmer small pericardial effusion is present.   5. The mitral valve is abnormal. Mild mitral valve regurgitation.  Moderate mitral annular calcification.   6. The aortic valve is tricuspid. Aortic valve regurgitation is trivial.  Aortic valve sclerosis is  present, with no evidence of aortic valve  stenosis.   7. The inferior vena cava is normal in size with greater than 50%  respiratory  variability, suggesting right atrial pressure of 3 mmHg.   LE [place] Summary:  RIGHT:  - There is no evidence of deep vein thrombosis in the lower extremity.  However, portions of this examination were limited- see technologist  comments above.    - No cystic structure found in the popliteal fossa.    LEFT:  - There is no evidence of deep vein thrombosis in the lower extremity.  However, portions of this examination were limited- see technologist  comments above.    - No cystic structure found in the popliteal fossa.   Consultations: Cardiology nephrology  Discharge Exam: Vitals:   05/24/22 0814 05/24/22 0920  BP: (!) 141/52 124/85  Pulse: 94 89  Resp: 20   Temp:    SpO2: 97%    Daughter at bedside Discussed d/c plans No complaints today  General: No acute distress. Cardiovascular: Heart sounds show [name] regular rate, and rhythm. Tele with brief run of SVT noted. Lungs: Clear to auscultation bilaterally Abdomen: Soft, nontender, nondistended Neurological: Alert and oriented 3. Moves all extremities 4. Cranial nerves II through XII grossly intact. Skin: Warm and dry. No rashes or lesions. Extremities: mild swelling of L knee, mild TTP  Discharge Instructions   Discharge Instructions     Discharge instructions   Complete by: As directed    You were seen for shortness of breath and weight gain and were found to have nephrotic syndrome (issue related to loss of protein via the kidneys).    The kidney doctors were consulted and you had [name] renal biopsy.  These results are currently pending at the time of discharge.  [name] office at [facility] should call you in the next week or so to schedule an appointment.  They'll review the biopsy results in follow up.  Your workup so far has been notable for elevated kappa light chains with [name] high K:L ratio.  Renal will follow up the biopsy to help determine the significance of this.  You had [name] fast heart rate  that can be followed with cardiology outpatient.  Continue your metoprolol.  Your ejection fraction (heart squeeze) is slightly decreased.  Continue to follow with cards outpatient.  Your LDL (bad cholesterol) is high.  Stop your pravastatin and start crestor.  Follow up with your PCP outpatient to discuss whether your dose should be increased or adjusted further as an outpatient.  Your x ray of your knee shows arthritis.  The history of your knee locking suggests Emon Lance possible meniscal injury.  Follow up with an orthopedic doctor regarding this issue.  You can continue voltaren and tylenol as needed.  I'm ok if you try Rayansh Herbst topical CBD cream.    Resume your eliquis today.   You have prediabetes.  This should be followed as an outpatient with your PCP.    Your TSH (thyroid test) was mildly elevated.  This can be repeated outpatient in Birdie Fetty few weeks.      Allergies as of 05/24/2022   No Known Allergies      Medication List     STOP taking these medications    gabapentin 300 MG capsule Commonly known as: NEURONTIN   pravastatin 20 MG tablet Commonly known as: PRAVACHOL   Tiadylt ER 240 MG 24 hr capsule Generic drug: diltiazem       TAKE  these medications    B-complex with vitamin C tablet Take 1 tablet by mouth daily.   CALTRATE 600 PLUS-VIT D PO Take 1 tablet by mouth in the morning and at bedtime.   ciprofloxacin-dexamethasone OTIC suspension Commonly known as: CIPRODEX Place 4 drops into the left ear once Virgilene Stryker week.   Eliquis 5 MG Tabs tablet Generic drug: apixaban TAKE 1 TABLET BY MOUTH TWICE Patty Lopezgarcia DAY   levothyroxine 25 MCG tablet Commonly known as: SYNTHROID Take 25 mcg by mouth every morning.   loratadine 10 MG tablet Commonly known as: CLARITIN Take 10 mg by mouth daily.   losartan 50 MG tablet Commonly known as: COZAAR TAKE 1 TABLET BY MOUTH EVERY DAY   metoprolol succinate 25 MG 24 hr tablet Commonly known as: TOPROL-XL Take 1 tablet (25 mg total) by mouth  daily.   potassium chloride SA 20 MEQ tablet Commonly known as: KLOR-CON M Take 2 tablets (40 mEq total) by mouth daily.   rosuvastatin 10 MG tablet Commonly known as: CRESTOR Take 1 tablet (10 mg total) by mouth daily. Start taking on: May 25, 2022   torsemide 20 MG tablet Commonly known as: DEMADEX Take 2 tablets (40 mg total) by mouth 2 (two) times daily.       No Known Allergies  Follow-up Information     Joylene Grapes, NP Follow up on 05/29/2022.   Specialties: Cardiology, Family Medicine Why: 2:45PM. Cardiology follow up Contact information: [address] [place] [address] [phone]         Arita Miss, MD Follow up.   Specialty: Nephrology Why: [name] office should call you.  If you don't get Pleshette Tomasini phone call within 1 week, call to set up an appt and follow up your biopsy results. Contact information: [address] [place] [address] [phone]         Darrow Bussing, MD Follow up.   Specialty: Family Medicine Contact information: [address] [place] [address] [phone]                  The results of significant diagnostics from this hospitalization (including imaging, microbiology, ancillary and laboratory) are listed below for reference.    Significant Diagnostic Studies: DG Knee 1-2 Views Left  Result Date: 05/24/2022 CLINICAL DATA:  Left knee pain. EXAM: LEFT KNEE - 1-2 VIEW COMPARISON:  None Available. FINDINGS: No fracture or dislocation. Moderate to severe tricompartmental degenerative change of the knee, worse with the medial compartment with near complete joint space loss, subchondral sclerosis, articular surface irregularity and osteophytosis. There is minimal lateral deviation of the tibial plateau in relation to the distal femoral condyles. No joint effusion. Enthesopathic change involving the superior and inferior poles of the patella. Regional soft tissues appear  normal. IMPRESSION: Moderate to severe tricompartmental degenerative change of the knee, worse within the medial compartment. Electronically Signed   By: Simonne Come M.D.   On: 05/24/2022 10:01   DG CHEST PORT 1 VIEW  Result Date: 05/23/2022 CLINICAL DATA:  Cough-DROPLET PRECAUTIONS [id number] Cough 10031 EXAM: PORTABLE CHEST 1 VIEW COMPARISON:  None Available. FINDINGS: Normal mediastinum and cardiac silhouette. Normal pulmonary vasculature. No evidence of effusion, infiltrate, or pneumothorax. No acute bony abnormality. IMPRESSION: No acute cardiopulmonary process. Electronically Signed   By: Genevive Bi M.D.   On: 05/23/2022 08:55   US BIOPSY (KIDNEY)  Result Date: 05/22/2022 INDICATION: 77 year old female referred for medical renal biopsy EXAM: ULTRASOUND-GUIDED MEDICAL RENAL BIOPSY MEDICATIONS:  None. ANESTHESIA/SEDATION: Moderate (conscious) sedation was employed during this procedure. Lacey Dotson total of Versed 1.0 mg and Fentanyl 50 mcg was administered intravenously by the radiology nurse. Total intra-service moderate Sedation Time: 10 minutes. The patient's level of consciousness and vital signs were monitored continuously by radiology nursing throughout the procedure under my direct supervision. COMPLICATIONS: None PROCEDURE: Informed written consent was obtained from the patient after Shakira Los thorough discussion of the procedural risks, benefits and alternatives. All questions were addressed. Maximal Sterile Barrier Technique was utilized including caps, mask, sterile gowns, sterile gloves, sterile drape, hand hygiene and skin antiseptic. Younis Mathey timeout was performed prior to the initiation of the procedure. Patient was positioned prone position on the gantry table. Images were stored sent to PACs. Once the patient is prepped and draped in the usual sterile fashion, the skin and subcutaneous tissues overlying the left kidney were generously infiltrated 1% lidocaine for local anesthesia. Using ultrasound  guidance, Daton Szilagyi 15 gauge guide needle was advanced into the lower cortex of the left kidney. Once we confirmed location of the needle tip, 2 separate 16 gauge core biopsy were achieved. Two Gel-Foam pledgets were infused with [name] small amount of saline. The needle was removed. Final images were stored. The patient tolerated the procedure well and remained hemodynamically stable throughout. No complications were encountered and no significant blood loss encountered. IMPRESSION: Status post ultrasound-guided medical renal biopsy Signed, Yvone Neu. Miachel Roux, RPVI [facility] Radiology Electronically Signed   By: Gilmer Mor D.O.   On: 05/22/2022 09:37   ECHOCARDIOGRAM COMPLETE  Result Date: 05/18/2022    ECHOCARDIOGRAM REPORT   Patient Name:   Rita Myers Date of Exam: 05/18/2022 Medical Rec #:  [id number]   Height:       52.0 in Accession #:    [id number]  Weight:       141.0 lb Date of Birth:  [birth year]  BSA:          1.451 m Patient Age:    76 years    BP:           177/79 mmHg Patient Gender: F           HR:           93 bpm. Exam Location:  Inpatient Procedure: 2D Echo, Cardiac Doppler and Color Doppler Indications:    Dyspnea R06.00  History:        Patient has prior history of Echocardiogram examinations, most                 recent 11/22/2020. Arrythmias:Atrial Fibrillation,                 Signs/Symptoms:Syncope; Risk Factors:Hypertension, Diabetes and                 Dyslipidemia.  Sonographer:    Lucendia Herrlich Referring Phys: [phone] STEPHEN RANCOUR IMPRESSIONS  1. Left ventricular ejection fraction, by estimation, is 40 to 45%. The left ventricle has mildly decreased function. The left ventricle demonstrates global hypokinesis. Left ventricular diastolic parameters are consistent with Grade II diastolic dysfunction (pseudonormalization). Elevated left atrial pressure.  2. Right ventricular systolic function is normal. The right ventricular size is normal.  There is mildly elevated pulmonary artery systolic pressure. The estimated right ventricular systolic pressure is 43.4 mmHg.  3. Left atrial size was mildly dilated.  4. Elizeo Rodriques small pericardial effusion is present.  5. The mitral valve is abnormal. Mild mitral valve regurgitation. Moderate mitral annular calcification.  6. The aortic valve is tricuspid. Aortic valve regurgitation is trivial. Aortic valve sclerosis is present, with no evidence of aortic valve stenosis.  7. The inferior vena cava is normal in size with greater than 50% respiratory variability, suggesting right atrial pressure of 3 mmHg. FINDINGS  Left Ventricle: Left ventricular ejection fraction, by estimation, is 40 to 45%. The left ventricle has mildly decreased function. The left ventricle demonstrates global hypokinesis. The left ventricular internal cavity size was normal in size. There is  no left ventricular hypertrophy. Left ventricular diastolic parameters are consistent with Grade II diastolic dysfunction (pseudonormalization). Elevated left atrial pressure. Right Ventricle: The right ventricular size is normal. No increase in right ventricular wall thickness. Right ventricular systolic function is normal. There is mildly elevated pulmonary artery systolic pressure. The tricuspid regurgitant velocity is 3.18  m/s, and with an assumed right atrial pressure of 3 mmHg, the estimated right ventricular systolic pressure is 43.4 mmHg. Left Atrium: Left atrial size was mildly dilated. Right Atrium: Right atrial size was normal in size. Pericardium: Evalee Gerard small pericardial effusion is present. Mitral Valve: The mitral valve is abnormal. Moderate mitral annular calcification. Mild mitral valve regurgitation. Tricuspid Valve: The tricuspid valve is normal in structure. Tricuspid valve regurgitation is mild. Aortic Valve: The aortic valve is tricuspid. Aortic valve regurgitation is trivial. Aortic regurgitation PHT measures 660 msec. Aortic valve sclerosis is  present, with no evidence of aortic valve stenosis. Aortic valve mean gradient measures 5.0 mmHg. Aortic valve peak gradient measures 9.3 mmHg. Aortic valve area, by VTI measures 1.85 cm. Pulmonic Valve: The pulmonic valve was grossly normal. Pulmonic valve regurgitation is trivial. Aorta: The aortic root and ascending aorta are structurally normal, with no evidence of dilitation. Venous: The inferior vena cava is normal in size with greater than 50% respiratory variability, suggesting right atrial pressure of 3 mmHg. IAS/Shunts: The interatrial septum was not well visualized.  LEFT VENTRICLE PLAX 2D LVIDd:         5.20 cm   Diastology LVIDs:         4.10 cm   LV e' medial:    4.13 cm/s LV PW:         0.90 cm   LV E/e' medial:  23.9 LV IVS:        0.90 cm   LV e' lateral:   4.90 cm/s LVOT diam:     1.90 cm   LV E/e' lateral: 20.1 LV SV:         53 LV SV Index:   37 LVOT Area:     2.84 cm  RIGHT VENTRICLE             IVC RV S prime:     18.10 cm/s  IVC diam: 1.20 cm TAPSE (M-mode): 3.0 cm LEFT ATRIUM           Index        RIGHT ATRIUM           Index LA diam:      4.30 cm 2.96 cm/m   RA Area:     13.20 cm LA Vol (A2C): 31.1 ml 21.44 ml/m  RA Volume:   28.30 ml  19.51 ml/m LA Vol (A4C): 73.6 ml 50.74 ml/m  AORTIC VALVE AV Area (Vmax):    1.87 cm AV Area (Vmean):   1.90 cm AV Area (VTI):     1.85 cm AV Vmax:           152.50 cm/s AV Vmean:  105.350 cm/s AV VTI:            0.286 m AV Peak Grad:      9.3 mmHg AV Mean Grad:      5.0 mmHg LVOT Vmax:         100.80 cm/s LVOT Vmean:        70.567 cm/s LVOT VTI:          0.187 m LVOT/AV VTI ratio: 0.65 AI PHT:            660 msec  AORTA Ao Root diam: 2.70 cm Ao Asc diam:  2.30 cm MITRAL VALVE                TRICUSPID VALVE MV Area (PHT): 3.57 cm     TR Peak grad:   40.4 mmHg MV Decel Time: 213 msec     TR Vmax:        318.00 cm/s MR Peak grad: 173.7 mmHg MR Mean grad: 124.0 mmHg    SHUNTS MR Vmax:      659.00 cm/s   Systemic VTI:  0.19 m MR Vmean:      537.0 cm/s    Systemic Diam: 1.90 cm MV E velocity: 98.65 cm/s MV Rosabell Geyer velocity: 129.50 cm/s MV E/Chanson Teems ratio:  0.76 Epifanio Lesches MD Electronically signed by Epifanio Lesches MD Signature Date/Time: 05/18/2022/3:51:14 PM    Final    VAS [place] LOWER EXTREMITY VENOUS (DVT) (ONLY [facility] & [facility])  Result Date: 05/18/2022  Lower Venous DVT Study Patient Name:  NIYANA CHESBRO  Date of Exam:   05/18/2022 Medical Rec #: [id number]    Accession #:    [id number] Date of Birth: May 26, 1945   Patient Gender: F Patient Age:   41 years Exam [facility] Procedure:      VAS [place] LOWER EXTREMITY VENOUS (DVT) Referring Phys: Glynn Octave --------------------------------------------------------------------------------  Indications: Pain.  Risk Factors: None identified. Limitations: Body habitus, poor ultrasound/tissue interface and patient pain tolerance. Comparison Study: 04/18/2022 - BILATERAL:                   - No evidence of deep vein thrombosis seen in the lower                   extremities,                   bilaterally.                   -                   RIGHT:                   - Jericca Russett cystic structure found in the popliteal fossa.                   - 2.2 x .6 x 2.6 cm fluid collection seen in posterior knee                   fossa.                   Moderate suprapatellar fluid seen and superficial edema noted                   in calf.                    LEFT:                   -  Acomplex cystic structure is found in the popliteal fossa.                   - 6.6 x 4.8 x 5.2 cm complex appearing Baker's cyst. Moderate                   to severe                   suprapatellar fluid noted with superficial edema in the calf. Performing Technologist: Oliver Hum RVT  Examination Guidelines: Crickett Abbett complete evaluation includes B-mode imaging, spectral Doppler, color Doppler, and power Doppler as needed of all accessible portions of each vessel. Bilateral testing is considered an integral part of Malasha Kleppe complete examination.  Limited examinations for reoccurring indications may be performed as noted. The reflux portion of the exam is performed with the patient in reverse Trendelenburg.  +---------+---------------+---------+-----------+----------+--------------+ RIGHT    CompressibilityPhasicitySpontaneityPropertiesThrombus Aging +---------+---------------+---------+-----------+----------+--------------+ CFV      Full           Yes      Yes                                 +---------+---------------+---------+-----------+----------+--------------+ SFJ      Full                                                        +---------+---------------+---------+-----------+----------+--------------+ FV Prox  Full                                                        +---------+---------------+---------+-----------+----------+--------------+ FV Mid   Full                                                        +---------+---------------+---------+-----------+----------+--------------+ FV DistalFull                                                        +---------+---------------+---------+-----------+----------+--------------+ PFV      Full                                                        +---------+---------------+---------+-----------+----------+--------------+ POP      Full           Yes      Yes                                 +---------+---------------+---------+-----------+----------+--------------+ PTV      Full                                                        +---------+---------------+---------+-----------+----------+--------------+  PERO     Full                                                        +---------+---------------+---------+-----------+----------+--------------+   +---------+---------------+---------+-----------+----------+--------------+ LEFT     CompressibilityPhasicitySpontaneityPropertiesThrombus Aging  +---------+---------------+---------+-----------+----------+--------------+ CFV      Full           Yes      Yes                                 +---------+---------------+---------+-----------+----------+--------------+ SFJ      Full                                                        +---------+---------------+---------+-----------+----------+--------------+ FV Prox  Full                                                        +---------+---------------+---------+-----------+----------+--------------+ FV Mid                  Yes      Yes                                 +---------+---------------+---------+-----------+----------+--------------+ FV Distal               Yes      Yes                                 +---------+---------------+---------+-----------+----------+--------------+ PFV      Full                                                        +---------+---------------+---------+-----------+----------+--------------+ POP      Full           Yes      Yes                                 +---------+---------------+---------+-----------+----------+--------------+ PTV      Full                                                        +---------+---------------+---------+-----------+----------+--------------+ PERO     Full                                                        +---------+---------------+---------+-----------+----------+--------------+  Summary: RIGHT: - There is no evidence of deep vein thrombosis in the lower extremity. However, portions of this examination were limited- see technologist comments above.  - No cystic structure found in the popliteal fossa.  LEFT: - There is no evidence of deep vein thrombosis in the lower extremity. However, portions of this examination were limited- see technologist comments above.  - No cystic structure found in the popliteal fossa.  *See table(s) above for measurements and observations.  Electronically signed by Sherald Hess MD on 05/18/2022 at 3:30:20 PM.    Final    DG Chest 2 View  Result Date: 05/17/2022 CLINICAL DATA:  Edema in the lower extremities EXAM: CHEST - 2 VIEW COMPARISON:  07/11/2017 FINDINGS: Transverse diameter of heart is increased. Pulmonary vascularity is unremarkable. There are no signs of pulmonary edema or focal pulmonary consolidation. There is blunting of both lateral CP angles. There is no pneumothorax. IMPRESSION: Cardiomegaly. There are no signs of pulmonary edema or focal pulmonary consolidation. Small bilateral pleural effusions. Electronically Signed   By: Ernie Avena M.D.   On: 05/17/2022 19:53   MR LUMBAR SPINE WO CONTRAST  Result Date: 05/16/2022 CLINICAL DATA:  Low back pain with bilateral radiculopathy EXAM: MRI LUMBAR SPINE WITHOUT CONTRAST TECHNIQUE: Multiplanar, multisequence MR imaging of the lumbar spine was performed. No intravenous contrast was administered. COMPARISON:  None Available. FINDINGS: Segmentation:  Standard. Alignment:  Grade 1 anterolisthesis of L5 on S1. Vertebrae:  No fracture, evidence of discitis, or bone lesion. Conus medullaris and cauda equina: Conus extends to the L1 level. Conus and cauda equina appear normal. Paraspinal and other soft tissues: Multifibroid uterus. Disc levels: T12-L1: Annular disc bulge and endplate spurring. No foraminal or canal stenosis. L1-L2: Minimal disc bulge.  No foraminal or canal stenosis. L2-L3: Mild annular disc bulge. Mild bilateral facet hypertrophy. No foraminal or canal stenosis. L3-L4: Annular disc bulge with mild bilateral facet hypertrophy and ligamentum flavum buckling. Mild canal stenosis with left greater than right subarticular recess stenosis. Mild-moderate right and mild left foraminal stenosis. L4-L5: Diffuse disc bulge with mild-moderate bilateral facet arthropathy and ligamentum flavum buckling. Severe canal stenosis with mild-to-moderate bilateral foraminal stenosis.  L5-S1: Diffuse disc bulge with moderate bilateral facet arthropathy. No canal stenosis. Moderate right and mild left foraminal stenosis. IMPRESSION: 1. Multilevel lumbar spondylosis, most pronounced at L4-5 where there is severe canal stenosis and mild-to-moderate bilateral foraminal stenosis. 2. Moderate right and mild left foraminal stenosis at L5-S1. 3. Mild canal stenosis at L3-L4 with left greater than right subarticular recess stenosis. 4. Multifibroid uterus. Electronically Signed   By: Duanne Guess D.O.   On: 05/16/2022 10:12    Microbiology: Recent Results (from the past 240 hour(s))  MRSA Next Gen by PCR, Nasal     Status: None   Collection Time: 05/19/22  1:23 AM   Specimen: Nasal Mucosa; Nasal Swab  Result Value Ref Range Status   MRSA by PCR Next Gen NOT DETECTED NOT DETECTED Final    Comment: (NOTE) The GeneXpert MRSA Assay (FDA approved for NASAL specimens only), is one component of Karelly Dewalt comprehensive MRSA colonization surveillance program. It is not intended to diagnose MRSA infection nor to guide or monitor treatment for MRSA infections. Test performance is not FDA approved in patients less than 60 years old. Performed at [facility], [address] [place], [place] [address]   Resp panel by RT-PCR (RSV, Flu Freedom Lopezperez&B, Covid) Anterior Nasal Swab     Status: None   Collection Time:  05/23/22  7:41 AM   Specimen: Anterior Nasal Swab  Result Value Ref Range Status   SARS Coronavirus 2 by RT PCR NEGATIVE NEGATIVE Final    Comment: (NOTE) SARS-CoV-2 target nucleic acids are NOT DETECTED.  The SARS-CoV-2 RNA is generally detectable in upper respiratory specimens during the acute phase of infection. The lowest concentration of SARS-CoV-2 viral copies this assay can detect is 138 copies/mL. Elaisha Zahniser negative result does not preclude SARS-Cov-2 infection and should not be used as the sole basis for treatment or other patient management decisions. Emmilee Reamer negative result may occur with   improper specimen collection/handling, submission of specimen other than nasopharyngeal swab, presence of viral mutation(s) within the areas targeted by this assay, and inadequate number of viral copies(<138 copies/mL). Ronalee Scheunemann negative result must be combined with clinical observations, patient history, and epidemiological information. The expected result is Negative.  Fact Sheet for Patients:  EntrepreneurPulse.com.au  Fact Sheet for Healthcare Providers:  IncredibleEmployment.be  This test is no t yet approved or cleared by the [place] FDA and  has been authorized for detection and/or diagnosis of SARS-CoV-2 by FDA under an Emergency Use Authorization (EUA). This EUA will remain  in effect (meaning this test can be used) for the duration of the COVID-19 declaration under Section 564(b)(1) of the Act, 21 U.S.C.section 360bbb-3(b)(1), unless the authorization is terminated  or revoked sooner.       Influenza Virtie Bungert by PCR NEGATIVE NEGATIVE Final   Influenza B by PCR NEGATIVE NEGATIVE Final    Comment: (NOTE) The Xpert Xpress SARS-CoV-2/FLU/RSV plus assay is intended as an aid in the diagnosis of influenza from Nasopharyngeal swab specimens and should not be used as Lenus Trauger sole basis for treatment. Nasal washings and aspirates are unacceptable for Xpert Xpress SARS-CoV-2/FLU/RSV testing.  Fact Sheet for Patients: EntrepreneurPulse.com.au  Fact Sheet for Healthcare Providers: IncredibleEmployment.be  This test is not yet approved or cleared by the [place] FDA and has been authorized for detection and/or diagnosis of SARS-CoV-2 by FDA under an Emergency Use Authorization (EUA). This EUA will remain in effect (meaning this test can be used) for the duration of the COVID-19 declaration under Section 564(b)(1) of the Act, 21 U.S.C. section 360bbb-3(b)(1), unless the authorization is terminated or revoked.      Resp Syncytial Virus by PCR NEGATIVE NEGATIVE Final    Comment: (NOTE) Fact Sheet for Patients: EntrepreneurPulse.com.au  Fact Sheet for Healthcare Providers: IncredibleEmployment.be  This test is not yet approved or cleared by the [place] FDA and has been authorized for detection and/or diagnosis of SARS-CoV-2 by FDA under an Emergency Use Authorization (EUA). This EUA will remain in effect (meaning this test can be used) for the duration of the COVID-19 declaration under Section 564(b)(1) of the Act, 21 U.S.C. section 360bbb-3(b)(1), unless the authorization is terminated or revoked.  Performed at [facility], [place] [address] [place], [place] [address]      Labs: Basic Metabolic Panel: Recent Labs  Lab 05/19/22 0055 05/20/22 0033 05/21/22 0026 05/22/22 0018 05/23/22 0042 05/24/22 0013  NA 140 137 138 137 137 137  K 4.2 4.0 3.5 3.5 4.0 3.7  CL 107 105 104 103 102 102  CO2 27 28 27 27 25 27   GLUCOSE 97 120* 99 100* 112* 95  BUN 28* 34* 38* 39* 41* 37*  CREATININE 0.96 1.00 1.01* 0.87 0.92 0.85  CALCIUM 8.1* 8.0* 8.3* 8.0* 8.0* 7.9*  MG 1.9  --   --   --   --   --  Liver Function Tests: Recent Labs  Lab 05/17/22 1927 05/20/22 0033  AST 43* 30  ALT 36 24  ALKPHOS 50 39  BILITOT 0.2* <0.1*  PROT 5.4* 4.4*  ALBUMIN 1.7* <1.5*   No results for input(s): "LIPASE", "AMYLASE" in the last 168 hours. No results for input(s): "AMMONIA" in the last 168 hours. CBC: Recent Labs  Lab 05/17/22 1927 05/19/22 0055  WBC 7.3 6.7  NEUTROABS 4.1  --   HGB 13.8 11.4*  HCT 42.6 33.1*  MCV 94.5 90.4  PLT 199 160   Cardiac Enzymes: No results for input(s): "CKTOTAL", "CKMB", "CKMBINDEX", "TROPONINI" in the last 168 hours. BNP: BNP (last 3 results) Recent Labs    05/17/22 1927  BNP 283.2*    ProBNP (last 3 results) Recent Labs    04/18/22 0941  PROBNP 717    CBG: No results for input(s): "GLUCAP" in the  last 168 hours.     Signed:  Lacretia Nicks MD.  [facility] 05/24/2022, 11:15 AM

## 2022-05-24 NOTE — Progress Notes (Signed)
Ambulated along the hallway with the mobility specialist tolerated well. No complaints of dizziness noted. Md aware, good to be discharged home. Awaiting meds from [place].

## 2022-05-24 NOTE — Progress Notes (Signed)
Mobility Specialist Progress Note    05/24/22 1117  Mobility  Activity Ambulated with assistance in hallway  Level of Assistance Contact guard assist, steadying assist  Assistive Device Four wheel walker  Distance Ambulated (ft) 180 ft  Activity Response Tolerated well  Mobility Referral Yes  $Mobility charge 1 Mobility   Pre-Mobility: 92 HR, 138/68 (88) BP, 96% SpO2 During Mobility: 107 HR, 94% SpO2 Post-Mobility: 93 HR, 155/75 (98) BP, 99% SpO2  Pt received in bed and agreeable. C/o 10/10 leg pain during. Returned to bed with call bell in reach.    Hildred Alamin Mobility Specialist  Please Psychologist, sport and exercise or Rehab Office at [phone]

## 2022-05-25 ENCOUNTER — Encounter: Payer: Medicare Other | Admitting: Physical Therapy

## 2022-05-26 ENCOUNTER — Ambulatory Visit: Payer: Medicare Other | Admitting: Nurse Practitioner

## 2022-05-29 ENCOUNTER — Encounter: Payer: Self-pay | Admitting: Nurse Practitioner

## 2022-05-29 ENCOUNTER — Ambulatory Visit: Payer: Medicare Other | Attending: Nurse Practitioner | Admitting: Nurse Practitioner

## 2022-05-29 VITALS — BP 130/70 | HR 93 | Ht 59.0 in | Wt 131.0 lb

## 2022-05-29 DIAGNOSIS — I480 Paroxysmal atrial fibrillation: Secondary | ICD-10-CM | POA: Diagnosis not present

## 2022-05-29 DIAGNOSIS — I340 Nonrheumatic mitral (valve) insufficiency: Secondary | ICD-10-CM

## 2022-05-29 DIAGNOSIS — I471 Supraventricular tachycardia, unspecified: Secondary | ICD-10-CM

## 2022-05-29 DIAGNOSIS — I429 Cardiomyopathy, unspecified: Secondary | ICD-10-CM

## 2022-05-29 DIAGNOSIS — I272 Pulmonary hypertension, unspecified: Secondary | ICD-10-CM

## 2022-05-29 DIAGNOSIS — I351 Nonrheumatic aortic (valve) insufficiency: Secondary | ICD-10-CM

## 2022-05-29 DIAGNOSIS — E782 Mixed hyperlipidemia: Secondary | ICD-10-CM | POA: Diagnosis not present

## 2022-05-29 DIAGNOSIS — I10 Essential (primary) hypertension: Secondary | ICD-10-CM | POA: Diagnosis not present

## 2022-05-29 DIAGNOSIS — N049 Nephrotic syndrome with unspecified morphologic changes: Secondary | ICD-10-CM

## 2022-05-29 NOTE — Progress Notes (Signed)
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  Past Medical History    Past Medical History:  Diagnosis Date   Aortic insufficiency    Essential hypertension 07/11/2017   Hyperlipemia    Hypertension    Hypokalemia 07/11/2017   Mitral regurgitation    Nausea, vomiting, and diarrhea 07/11/2017   Paroxysmal A-fib ([place]) 07/11/2017   Pulmonary hypertension ([place])    Syncope, vasovagal 07/11/2017   History reviewed. No pertinent surgical history.  Allergies  No Known Allergies   Labs/Other Studies Reviewed    The following studies were reviewed today: Echo 05/18/2022:  IMPRESSIONS    1. Left ventricular ejection fraction, by estimation, is 40 to 45%. The  left ventricle has mildly decreased function. The left ventricle  demonstrates global hypokinesis. Left ventricular diastolic parameters are  consistent with Grade II diastolic  dysfunction (pseudonormalization). Elevated left atrial pressure.   2. Right ventricular systolic function is normal. The right ventricular  size is normal. There is mildly elevated pulmonary artery systolic  pressure. The estimated right ventricular systolic pressure is 96.2 mmHg.   3. Left atrial size was mildly dilated.   4. A small pericardial effusion is present.   5. The mitral valve is abnormal. Mild mitral valve regurgitation.  Moderate mitral annular calcification.   6. The aortic valve is tricuspid. Aortic valve regurgitation is trivial.  Aortic valve sclerosis is  present, with no evidence of aortic valve  stenosis.   7. The inferior vena cava is normal in size with greater than 50%  respiratory variability, suggesting right atrial pressure of 3 mmHg.   Recent Labs: 04/18/2022: NT-Pro BNP 717 05/17/2022: B Natriuretic Peptide 283.2 05/18/2022: TSH 5.415 05/19/2022: Hemoglobin 11.4; Magnesium 1.9; Platelets 160 05/20/2022: ALT 24 05/24/2022: BUN 37; Creatinine, Ser 0.85; Potassium 3.7; Sodium 137  Recent Lipid Panel    Component Value Date/Time   CHOL 277 (H) 05/19/2022 0055   TRIG 166 (H) 05/19/2022 0055   HDL 77 05/19/2022 0055   CHOLHDL 3.6 05/19/2022 0055   VLDL 33 05/19/2022 0055   LDLCALC 167 (H) 05/19/2022 0055    History of Present Illness    77 year old female with the above past medial history including paroxysmal atrial fibrillation, cardiomyopathy, hypertension, pulmonary hypertension by echo in 2020, 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  Echocardiogram in July 2022 showed normal LV function, G2 DD, mild mitral valve regurgitation, mild aortic insufficiency.  She recently traveled to [place] and developed lower extremity edema.  Venous Dopplers per report showed thrombus in her left lesser saphenous vein. She was treated with short course of Lasix with no significant improvement. Follow-up venous Dopplers on 04/18/2022 showed no DVT. She presented to the ED on 05/18/2022 with progressive bilateral lower extremity edema that extended into her thighs.  Cardiology was consulted. BNP was mildly elevated. She was diuresed with IV Lasix.  Lower extremity duplex was negative for DVT.  Repeat echocardiogram showed EF 40 to 45%, mildly decreased LV function, LV global hypokinesis, G2 DD, normal RV systolic function, mildly elevated PASP, small pericardial effusion, mild mitral  valve regurgitation, moderate MAC, trivial aortic valve regurgitation.  It was noted that cardiac CTA should be  considered in the future to rule out coronary disease given reduced EF.  She did have a run of SVT on telemetry.  She was noted to have hypoalbuminemia and was diagnosed with nephrotic syndrome.  Nephrology was consulted.  She underwent renal biopsy, results pending.  It was felt that her lower extremity edema was multifactorial in the setting of nephrotic syndrome, possible acute on chronic systolic heart failure.  She was discharged home in stable condition on 05/24/2022.  She presents today for follow-up accompanied by her daughter.  Since her hospitalization she has been stable from a cardiac standpoint.  She denies chest pain, dyspnea, palpitations, worsening edema, PND, orthopnea, weight gain.  Her swelling has improved dramatically.  She is still awaiting results of her renal biopsy and is pending follow-up with nephrology.  She is experiencing significant left knee pain in the setting of severe osteoarthritis.  She feels this is somewhat limiting her overall mobility.  She has follow-up scheduled with orthopedics later this week week.   Home Medications    Current Outpatient Medications  Medication Sig Dispense Refill   apixaban (ELIQUIS) 5 MG TABS tablet TAKE 1 TABLET BY MOUTH TWICE A DAY 60 tablet 5   B Complex-C (B-COMPLEX WITH VITAMIN C) tablet Take 1 tablet by mouth daily.     Calcium-Vitamin D (CALTRATE 600 PLUS-VIT D PO) Take 1 tablet by mouth in the morning and at bedtime.     ciprofloxacin-dexamethasone (CIPRODEX) OTIC suspension Place 4 drops into the left ear once a week.     levothyroxine (SYNTHROID) 25 MCG tablet Take 25 mcg by mouth every morning.     loratadine (CLARITIN) 10 MG tablet Take 10 mg by mouth daily.     losartan (COZAAR) 50 MG tablet TAKE 1 TABLET BY MOUTH EVERY DAY 90 tablet 3   metoprolol succinate (TOPROL-XL) 25 MG 24 hr tablet Take 1 tablet (25 mg total) by mouth daily. 30 tablet 0   potassium chloride SA (KLOR-CON M) 20 MEQ tablet Take 2 tablets (40 mEq total)  by mouth daily. 60 tablet 0   rosuvastatin (CRESTOR) 10 MG tablet Take 1 tablet (10 mg total) by mouth daily. 30 tablet 0   torsemide (DEMADEX) 20 MG tablet Take 2 tablets (40 mg total) by mouth 2 (two) times daily. 120 tablet 0   No current facility-administered medications for this visit.     Review of Systems    She denies chest pain, palpitations, dyspnea, pnd, orthopnea, n, v, dizziness, syncope, weight gain, or early satiety. All other systems reviewed and are otherwise negative except as noted above.   Physical Exam    VS:  BP 130/70   Pulse 93   Ht _0  (1.499 m)   Wt 131 lb (59.4 kg)   BMI 26.46 kg/m   GEN: Well nourished, well developed, in no acute distress. HEENT: normal. Neck: Supple, no JVD, carotid bruits, or masses. Cardiac: RRR, no murmurs, rubs, or gallops. No clubbing, cyanosis, edema.  Radials/DP/PT 2+ and equal bilaterally.  Respiratory:  Respirations regular and unlabored, clear to auscultation bilaterally. GI: Soft, nontender, nondistended, BS + x 4. MS: no deformity or atrophy. Skin: warm and dry, no rash. Neuro:  Strength and sensation are intact. Psych: Normal affect.  Accessory Clinical Findings    ECG personally reviewed by me today -sinus rhythm with frequent PACs, 93 bpm- no acute changes.  Lab Results  Component Value Date   WBC 6.7 05/19/2022   HGB 11.4 (L) 05/19/2022   HCT 33.1 (L) 05/19/2022   MCV 90.4 05/19/2022   PLT 160 05/19/2022   Lab Results  Component Value Date   CREATININE 0.85 05/24/2022   BUN 37 (H) 05/24/2022   NA 137 05/24/2022   K 3.7 05/24/2022   CL 102 05/24/2022   CO2 27 05/24/2022   Lab Results  Component Value Date   ALT 24 05/20/2022   AST 30 05/20/2022   ALKPHOS 39 05/20/2022   BILITOT <0.1 (L) 05/20/2022   Lab Results  Component Value Date   CHOL 277 (H) 05/19/2022   HDL 77 05/19/2022   LDLCALC 167 (H) 05/19/2022   TRIG 166 (H) 05/19/2022   CHOLHDL 3.6 05/19/2022    Lab Results  Component  Value Date   HGBA1C 6.1 (H) 05/19/2022    Assessment & Plan    1. Cardiomyopathy: Unclear etiology.  Echo during recent hospitalization showed EF 40 to 45%, mildly decreased LV function, LV global hypokinesis, G2 DD, normal RV systolic function, mildly elevated PASP, small pericardial effusion, mild mitral regurgitation, moderate MAC, trivial aortic valve regurgitation.  She has stable nonpitting left lower extremity edema, otherwise euvolemic and well compensated on exam.  Stable with no anginal symptoms.  There was mention of possible coronary CT angiogram  for further risk stratification during her recent hospitalization, will discuss with Dr. Stanford Breed.  Discussed daily weights.  Continue losartan, metoprolol, torsemide.  2. Paroxysmal atrial fibrillation: EKG today shows sinus rhythm with frequent PACs.  Denies palpitations. Continue Eliquis, metoprolol.  3. SVT: Noted on telemetry during recent hospitalization. Denies any recent palpitations.  Continue metoprolol.  4. Hypertension: BP well controlled. Continue current antihypertensive regimen.   5. Pulmonary hypertension: Stable on most recent echo.   6. Valvular heart disease: Most recent echo stable with evidence of mild mitral valve regurgitation, moderate MAC, trivial aortic valve regurgitation.   7. Nephrotic syndrome: Renal biopsy results pending. Following with nephrology.  8. Hyperlipidemia: LDL was 167 on 05/19/2022.  Recently transitioned from pravastatin to Crestor.  Consider repeat fasting lipid panel, LFTs with next follow-up appointment.  9. Disposition: Follow-up with APP in 1 month, follow-up in 3 to 5 months with Dr. Stanford Breed.      Lenna Sciara, NP 05/29/2022, 4:20 PM

## 2022-05-29 NOTE — Patient Instructions (Signed)
Medication Instructions:  Your physician recommends that you continue on your current medications as directed. Please refer to the Current Medication list given to you today.  *If you need a refill on your cardiac medications before your next appointment, please call your pharmacy*   Lab Work: NONE ordered at this time of appointment   If you have labs (blood work) drawn today and your tests are completely normal, you will receive your results only by: [place] (if you have MyChart) OR A paper copy in the mail If you have any lab test that is abnormal or we need to change your treatment, we will call you to review the results.   Testing/Procedures: NONE ordered at this time of appointment     Follow-Up: At [facility], you and your health needs are our priority.  As part of our continuing mission to provide you with exceptional heart care, we have created designated Provider Care Teams.  These Care Teams include your primary Cardiologist (physician) and Advanced Practice Providers (APPs -  Physician Assistants and Nurse Practitioners) who all work together to provide you with the care you need, when you need it.  We recommend signing up for the patient portal called "MyChart".  Sign up information is provided on this After Visit Summary.  MyChart is used to connect with patients for Virtual Visits (Telemedicine).  Patients are able to view lab/test results, encounter notes, upcoming appointments, etc.  Non-urgent messages can be sent to your provider as well.   To learn more about what you can do with MyChart, go to [email or web address].    Your next appointment:   1 month(s) 3-5 months (Dr. Stanford Breed)  Provider:   Diona Browner, NP        Other Instructions

## 2022-05-30 ENCOUNTER — Encounter (HOSPITAL_COMMUNITY): Payer: Self-pay

## 2022-05-30 LAB — SURGICAL PATHOLOGY

## 2022-05-31 ENCOUNTER — Encounter: Payer: Medicare Other | Admitting: Physical Therapy

## 2022-05-31 ENCOUNTER — Ambulatory Visit (HOSPITAL_BASED_OUTPATIENT_CLINIC_OR_DEPARTMENT_OTHER): Payer: Medicare Other | Admitting: Orthopaedic Surgery

## 2022-05-31 DIAGNOSIS — M1712 Unilateral primary osteoarthritis, left knee: Secondary | ICD-10-CM | POA: Diagnosis not present

## 2022-05-31 NOTE — Progress Notes (Signed)
Chief Complaint: Left knee pain     History of Present Illness:    Rita Myers is a 77 y.o. female presents today with ongoing left knee pain that has been worse over the last several months.  She states that she does have a baseline knee pain although she was recently been going through cardiac issues with lower extremity swelling that is really flared up the knee.  She does not necessarily have pain when she is sitting but more standing and being active.  She has been using topical CBD as well as ice with her daughter although this appears to have transient to little effect.  She is here today for further assessment.    Surgical History:   none  PMH/PSH/Family History/Social History/Meds/Allergies:    Past Medical History:  Diagnosis Date   Aortic insufficiency    Essential hypertension 07/11/2017   Hyperlipemia    Hypertension    Hypokalemia 07/11/2017   Mitral regurgitation    Nausea, vomiting, and diarrhea 07/11/2017   Paroxysmal A-fib ([place]) 07/11/2017   Pulmonary hypertension ([place])    Syncope, vasovagal 07/11/2017   No past surgical history on file. Social History   Socioeconomic History   Marital status: Married    Spouse name: Not on file   Number of children: Not on file   Years of education: Not on file   Highest education level: Not on file  Occupational History   Not on file  Tobacco Use   Smoking status: Never   Smokeless tobacco: Never  Vaping Use   Vaping Use: Never used  Substance and Sexual Activity   Alcohol use: No   Drug use: No   Sexual activity: Not on file  Other Topics Concern   Not on file  Social History Narrative   Not on file   Social Determinants of Health   Financial Resource Strain: Not on file  Food Insecurity: No Food Insecurity (05/19/2022)   Hunger Vital Sign    Worried About Running Out of Food in the Last Year: Never true    Ran Out of Food in the Last Year: Never true  Transportation  Needs: No Transportation Needs (05/19/2022)   PRAPARE - Hydrologist (Medical): No    Lack of Transportation (Non-Medical): No  Physical Activity: Not on file  Stress: Not on file  Social Connections: Not on file   Family History  Problem Relation Age of Onset   Diabetes Mother    Diabetes Sister    Diabetes Brother    No Known Allergies Current Outpatient Medications  Medication Sig Dispense Refill   apixaban (ELIQUIS) 5 MG TABS tablet TAKE 1 TABLET BY MOUTH TWICE A DAY 60 tablet 5   B Complex-C (B-COMPLEX WITH VITAMIN C) tablet Take 1 tablet by mouth daily.     Calcium-Vitamin D (CALTRATE 600 PLUS-VIT D PO) Take 1 tablet by mouth in the morning and at bedtime.     ciprofloxacin-dexamethasone (CIPRODEX) OTIC suspension Place 4 drops into the left ear once a week.     levothyroxine (SYNTHROID) 25 MCG tablet Take 25 mcg by mouth every morning.     loratadine (CLARITIN) 10 MG tablet Take 10 mg by mouth daily.     losartan (COZAAR) 50 MG tablet TAKE 1 TABLET BY MOUTH EVERY  DAY 90 tablet 3   metoprolol succinate (TOPROL-XL) 25 MG 24 hr tablet Take 1 tablet (25 mg total) by mouth daily. 30 tablet 0   potassium chloride SA (KLOR-CON M) 20 MEQ tablet Take 2 tablets (40 mEq total) by mouth daily. 60 tablet 0   rosuvastatin (CRESTOR) 10 MG tablet Take 1 tablet (10 mg total) by mouth daily. 30 tablet 0   torsemide (DEMADEX) 20 MG tablet Take 2 tablets (40 mg total) by mouth 2 (two) times daily. 120 tablet 0   No current facility-administered medications for this visit.   No results found.  Review of Systems:   A ROS was performed including pertinent positives and negatives as documented in the HPI.  Physical Exam :   Constitutional: NAD and appears stated age Neurological: Alert and oriented Psych: Appropriate affect and cooperative There were no vitals taken for this visit.   Comprehensive Musculoskeletal Exam:    There is tenderness to palpation about the  medial lateral joint lines with somewhat diffuse pain.  Range of motion is from 0 to 120 degrees with crepitus.  Sensation is intact throughout.  Imaging:   Xray (4 views left knee): Significant osteoarthritis predominantly involving the medial joint space as well as chondral calcification consistent with pseudo gout   I personally reviewed and interpreted the radiographs.   Assessment:   77 y.o. female with left knee pain consistent with a pseudogout flareup on top of moderate to severe osteoarthritis.  That effect I recommended ultrasound-guided injection of the knee stomach hopefully get her some relief.  Will plan to proceed with this today.  I will plan to see her back as needed for reassessment  Plan :    -Return to clinic as needed    Procedure Note  Patient: Rita Myers             Date of Birth: 01-Feb-1946           MRN: [id number]             Visit Date: 05/31/2022  Procedures: Visit Diagnoses: No diagnosis found.  No procedures performed        I personally saw and evaluated the patient, and participated in the management and treatment plan.  Vanetta Mulders, MD Attending Physician, Orthopedic Surgery  This document was dictated using Dragon voice recognition software. A reasonable attempt at proof reading has been made to minimize errors.

## 2022-06-02 ENCOUNTER — Encounter: Payer: Medicare Other | Admitting: Physical Therapy

## 2022-06-05 ENCOUNTER — Ambulatory Visit: Payer: Medicare Other

## 2022-06-05 ENCOUNTER — Other Ambulatory Visit: Payer: Self-pay

## 2022-06-05 ENCOUNTER — Encounter: Payer: Self-pay | Admitting: Student

## 2022-06-05 ENCOUNTER — Encounter (HOSPITAL_COMMUNITY): Payer: Self-pay

## 2022-06-05 ENCOUNTER — Ambulatory Visit (HOSPITAL_COMMUNITY): Payer: Medicare Other | Attending: Internal Medicine

## 2022-06-05 ENCOUNTER — Emergency Department (HOSPITAL_COMMUNITY): Payer: Medicare Other

## 2022-06-05 ENCOUNTER — Ambulatory Visit (INDEPENDENT_AMBULATORY_CARE_PROVIDER_SITE_OTHER): Payer: Medicare Other | Admitting: Student

## 2022-06-05 ENCOUNTER — Emergency Department (HOSPITAL_COMMUNITY)
Admission: EM | Admit: 2022-06-05 | Discharge: 2022-06-05 | Disposition: A | Discharge status: Home / Self Care | Payer: Medicare Other | Attending: Emergency Medicine | Admitting: Emergency Medicine

## 2022-06-05 ENCOUNTER — Inpatient Hospital Stay (HOSPITAL_COMMUNITY): Admit: 2022-06-05 | Payer: Medicare Other

## 2022-06-05 VITALS — BP 162/77 | HR 90 | Temp 98.2°F | Ht <= 58 in | Wt 128.3 lb

## 2022-06-05 DIAGNOSIS — R0789 Other chest pain: Secondary | ICD-10-CM | POA: Diagnosis not present

## 2022-06-05 DIAGNOSIS — Z7901 Long term (current) use of anticoagulants: Secondary | ICD-10-CM | POA: Diagnosis not present

## 2022-06-05 DIAGNOSIS — Z79899 Other long term (current) drug therapy: Secondary | ICD-10-CM | POA: Diagnosis not present

## 2022-06-05 DIAGNOSIS — R079 Chest pain, unspecified: Secondary | ICD-10-CM | POA: Insufficient documentation

## 2022-06-05 DIAGNOSIS — R0602 Shortness of breath: Secondary | ICD-10-CM | POA: Diagnosis not present

## 2022-06-05 DIAGNOSIS — I110 Hypertensive heart disease with heart failure: Secondary | ICD-10-CM | POA: Diagnosis not present

## 2022-06-05 DIAGNOSIS — I5020 Unspecified systolic (congestive) heart failure: Secondary | ICD-10-CM | POA: Diagnosis not present

## 2022-06-05 DIAGNOSIS — R600 Localized edema: Secondary | ICD-10-CM | POA: Diagnosis not present

## 2022-06-05 DIAGNOSIS — I700 Atherosclerosis of aorta: Secondary | ICD-10-CM | POA: Diagnosis not present

## 2022-06-05 LAB — COMPREHENSIVE METABOLIC PANEL
ALT: 27 U/L (ref 0–44)
AST: 34 U/L (ref 15–41)
Albumin: 1.9 g/dL — ABNORMAL LOW (ref 3.5–5.0)
Alkaline Phosphatase: 48 U/L (ref 38–126)
Anion gap: 7 (ref 5–15)
BUN: 31 mg/dL — ABNORMAL HIGH (ref 8–23)
CO2: 25 mmol/L (ref 22–32)
Calcium: 8.4 mg/dL — ABNORMAL LOW (ref 8.9–10.3)
Chloride: 98 mmol/L (ref 98–111)
Creatinine, Ser: 0.93 mg/dL (ref 0.44–1.00)
GFR, Estimated: >60 mL/min (ref >60)
Glucose, Bld: 97 mg/dL (ref 70–99)
Potassium: 4.6 mmol/L (ref 3.5–5.1)
Sodium: 130 mmol/L — ABNORMAL LOW (ref 135–145)
Total Bilirubin: 0.2 mg/dL — ABNORMAL LOW (ref 0.3–1.2)
Total Protein: 6 g/dL — ABNORMAL LOW (ref 6.5–8.1)

## 2022-06-05 LAB — CBC
HCT: 40.2 % (ref 36.0–46.0)
Hemoglobin: 13.7 g/dL (ref 12.0–15.0)
MCH: 30.6 pg (ref 26.0–34.0)
MCHC: 34.1 g/dL (ref 30.0–36.0)
MCV: 89.7 fL (ref 80.0–100.0)
Platelets: 225 10*3/uL (ref 150–400)
RBC: 4.48 MIL/uL (ref 3.87–5.11)
RDW: 12.2 % (ref 11.5–15.5)
WBC: 7.9 10*3/uL (ref 4.0–10.5)
nRBC: 0 % (ref 0.0–0.2)

## 2022-06-05 LAB — TROPONIN I (HIGH SENSITIVITY)
Troponin I (High Sensitivity): 9 ng/L (ref <18)
Troponin I (High Sensitivity): 7 ng/L (ref <18)

## 2022-06-05 LAB — D-DIMER, QUANTITATIVE: D-Dimer, Quant: 1.09 ug/mL-FEU — ABNORMAL HIGH (ref 0.00–0.50)

## 2022-06-05 LAB — BRAIN NATRIURETIC PEPTIDE: B Natriuretic Peptide: 259.1 pg/mL — ABNORMAL HIGH (ref 0.0–100.0)

## 2022-06-05 MED ORDER — IOHEXOL 350 MG/ML SOLN
75.0000 mL | Freq: Once | INTRAVENOUS | Status: AC | PRN
  Administered 2022-06-05: 75 mL via INTRAVENOUS

## 2022-06-05 MED ORDER — ASPIRIN 81 MG PO CHEW
324.0000 mg | CHEWABLE_TABLET | Freq: Once | ORAL | Status: AC
  Administered 2022-06-05: 324 mg via ORAL
  Filled 2022-06-05: qty 4

## 2022-06-05 NOTE — Progress Notes (Signed)
Subjective:  CC: hospital follow up  HPI:  [name] is a 77 y.o. female with a past medical history of cardiomyopathy, HFrEF, HTN, atrial fibrillation on AC, new membranous glomerulonephritis with nephrotic syndrome, HLD and presents today for hospital follow up. Please see problem based assessment and plan for additional details.  Past Medical History:  Diagnosis Date   Aortic insufficiency    Essential hypertension 07/11/2017   Hyperlipemia    Hypertension    Hypokalemia 07/11/2017   Mitral regurgitation    Nausea, vomiting, and diarrhea 07/11/2017   Paroxysmal A-fib ([place]) 07/11/2017   Pulmonary hypertension ([place])    Syncope, vasovagal 07/11/2017    Current Outpatient Medications on File Prior to Visit  Medication Sig Dispense Refill   apixaban (ELIQUIS) 5 MG TABS tablet TAKE 1 TABLET BY MOUTH TWICE A DAY 60 tablet 5   B Complex-C (B-COMPLEX WITH VITAMIN C) tablet Take 1 tablet by mouth daily.     Calcium-Vitamin D (CALTRATE 600 PLUS-VIT D PO) Take 1 tablet by mouth in the morning and at bedtime.     ciprofloxacin-dexamethasone (CIPRODEX) OTIC suspension Place 4 drops into the left ear once a week.     levothyroxine (SYNTHROID) 25 MCG tablet Take 25 mcg by mouth every morning.     loratadine (CLARITIN) 10 MG tablet Take 10 mg by mouth daily.     losartan (COZAAR) 50 MG tablet TAKE 1 TABLET BY MOUTH EVERY DAY 90 tablet 3   metoprolol succinate (TOPROL-XL) 25 MG 24 hr tablet Take 1 tablet (25 mg total) by mouth daily. 30 tablet 0   potassium chloride SA (KLOR-CON M) 20 MEQ tablet Take 2 tablets (40 mEq total) by mouth daily. 60 tablet 0   rosuvastatin (CRESTOR) 10 MG tablet Take 1 tablet (10 mg total) by mouth daily. 30 tablet 0   torsemide (DEMADEX) 20 MG tablet Take 2 tablets (40 mg total) by mouth 2 (two) times daily. 120 tablet 0   No current facility-administered medications on file prior to visit.    Family History  Problem Relation Age of Onset   Diabetes  Mother    Diabetes Sister    Diabetes Brother     Social History   Socioeconomic History   Marital status: Married    Spouse name: Not on file   Number of children: Not on file   Years of education: Not on file   Highest education level: Not on file  Occupational History   Not on file  Tobacco Use   Smoking status: Never   Smokeless tobacco: Never  Vaping Use   Vaping Use: Never used  Substance and Sexual Activity   Alcohol use: No   Drug use: No   Sexual activity: Not on file  Other Topics Concern   Not on file  Social History Narrative   Not on file   Social Determinants of Health   Financial Resource Strain: Not on file  Food Insecurity: No Food Insecurity (05/19/2022)   Hunger Vital Sign    Worried About Running Out of Food in the Last Year: Never true    Ran Out of Food in the Last Year: Never true  Transportation Needs: No Transportation Needs (05/19/2022)   PRAPARE - Hydrologist (Medical): No    Lack of Transportation (Non-Medical): No  Physical Activity: Not on file  Stress: Not on file  Social Connections: Not on file  Intimate Partner Violence: Not At Risk (05/19/2022)  Humiliation, Afraid, Rape, and Kick questionnaire    Fear of Current or Ex-Partner: No    Emotionally Abused: No    Physically Abused: No    Sexually Abused: No    Review of Systems: ROS negative except for what is noted on the assessment and plan.  Objective:   Vitals:   06/05/22 0943  BP: (!) 162/77  Pulse: 90  Temp: 98.2 F (36.8 C)  TempSrc: Oral  SpO2: 100%  Weight: 128 lb 4.8 oz (58.2 kg)  Height: 4\' 9"  (1.448 m)    Physical Exam: Constitutional Elderly woman laying in bed, in no acute distress HENT: normocephalic atraumatic, mucous membranes moist Eyes: conjunctiva non-erythematous Neck: supple Cardiovascular: irregular rhythm, no m/r/g, no JVD, B/L Pulmonary/Chest: normal work of breathing on room air, lungs clear to auscultation  bilaterally Abdominal: soft, non-tender, non-distended MSK: normal bulk and tone Neurological: alert & oriented x 3 Psych: Normal mood and affect     Assessment & Plan:   Chest pain at rest Patient presenting with Substernal, chest pain since yesterday afternoon present at rest and increased from baseline on exertion and radiation to the L shoulder. Not associated with diaphoresis, sob, change sin vision. No GERD symptoms, lightheadedness, pleurisy. Last seen in Cardiology office on 05/29/2021 after hospital follow up, no chest pain at office or recent hospitalization.  BP 162/77 today, 87 HR, irregular rhythm, no JVD, no LE edema, CTAB, bilateral Radial pulses symmetrical. No tenderness to palpation of L chest wall.  Patient with recent diagnosis of HFrEF and cardiomyopathy of unknown etiology. History moderately suspicion for ACS. HEAART score of 4-5, moderate prior to data acquisition. EKG with rate controlled atrial fibrillation without acute ST changes or T wave abnormalities. Reviewed previous notes, and given recent cardiac diagnoses, patient would benefit from further cardiac risk stratification prior to chest pain presentation to further work up cardiomyopathy and new HFrEF diagnosis. Given new onset chest pain, patient warrants evaluation in the emegency department to follow up with repeat EKG, troponin, and prompt cardiology follow up. Discussed plans with patient and patient 's daughter, they are in agreement.    Discussed patient's case with Charge RN Roselyn Reef. Patient will be escorted to ED from [facility].   -Will follow up with patient to discussed recent hospital discharge in follow up appointment in 1-2 weeks   Patient seen with Dr. Angelia Mould

## 2022-06-05 NOTE — Patient Instructions (Addendum)
Thank you, Ms.Danijah Kamath for allowing [place] to provide your care today. Today we discussed your chest pain. Please come back to see [place] in the office in 1-2 weeks to discuss your other medical problems.     Please follow-up in in 1-2 weeks.  Please make sure to arrive 15 minutes prior to your next appointment. If you arrive late, you may be asked to reschedule.    We look forward to seeing you next time. Please call our clinic at [phone] if you have any questions or concerns. The best time to call is Monday-Friday from 9am-4pm, but there is someone available 24/7. If after hours or the weekend, call the main hospital number and ask for the Internal Medicine Resident On-Call. If you need medication refills, please notify your pharmacy one week in advance and they will send [place] a request.   Thank you for letting us take part in your care. Wishing you the best!  Romana Juniper, MD 06/05/2022, 10:53 AM [facility] Internal Medicine Resident, PGY-1 .

## 2022-06-05 NOTE — ED Provider Triage Note (Signed)
Emergency Medicine Provider Triage Evaluation Note  Carrieanne Kleen , a 77 y.o. female  was evaluated in triage.  Pt complains of SOB and chest pain when going to the doctor's today. States that SOB is normal for her but chest pain is new and radiates into L shoulder.  Review of Systems  Positive: Chest pain, SOB Negative: vomiting  Physical Exam  BP (!) 145/68   Pulse 79   Temp 98.3 F (36.8 C)   Resp 16   Ht 4\' 9"  (1.448 m)   Wt 58.1 kg   SpO2 100%   BMI 27.70 kg/m  Gen:   Awake, no distress   Resp:  Normal effort  MSK:   Moves extremities without difficulty   Medical Decision Making  Medically screening exam initiated at 10:59 AM.  Appropriate orders placed.  Alazay Hawks was informed that the remainder of the evaluation will be completed by another provider, this initial triage assessment does not replace that evaluation, and the importance of remaining in the ED until their evaluation is complete.    Osvaldo Shipper, [place] 06/05/22 1100

## 2022-06-05 NOTE — Assessment & Plan Note (Signed)
Patient presenting with Substernal, chest pain since yesterday afternoon present at rest and increased from baseline on exertion and radiation to the L shoulder. Not associated with diaphoresis, sob, change sin vision. No GERD symptoms, lightheadedness, pleurisy. Last seen in Cardiology office on 05/29/2021 after hospital follow up, no chest pain at office or recent hospitalization.  BP 162/77 today, 87 HR, irregular rhythm, no JVD, no LE edema, CTAB, bilateral Radial pulses symmetrical. No tenderness to palpation of L chest wall.  Patient with recent diagnosis of HFrEF and cardiomyopathy of unknown etiology. History moderately suspicion for ACS. HEAART score of 4-5, moderate prior to data acquisition. EKG with rate controlled atrial fibrillation without acute ST changes or T wave abnormalities. Reviewed previous notes, and given recent cardiac diagnoses, patient would benefit from further cardiac risk stratification prior to chest pain presentation to further work up cardiomyopathy and new HFrEF diagnosis. Given new onset chest pain, patient warrants evaluation in the emegency department to follow up with repeat EKG, troponin, and prompt cardiology follow up. Discussed plans with patient and patient 's daughter, they are in agreement.    Discussed patient's case with Charge RN Roselyn Reef. Patient will be escorted to ED from [facility].   -Will follow up with patient to discussed recent hospital discharge in follow up appointment in 1-2 weeks

## 2022-06-05 NOTE — ED Provider Notes (Signed)
[facility]ERGENC[facility] DEPARTMENT AT [facility] Provider Note   CSN: [id number] Arrival date & time: 06/05/22  1045     History  Chief Complaint  Patient presents with   Chest Pain    Rita Myers is a 77 y.o. female.  77 year old female with a history of heart failure with reduced ejection fraction, hypertension, atrial fibrillation on Eliquis, and nephrotic syndrome (membranous glomerulonephritis) who presents to the emergency department with chest pain.  Says that since yesterday she has had sharp pains in her left upper chest.  Says that it radiates down to her left arm.  Is not exertional or pleuritic.  Does have some mild shortness of breath associated with it.  No diaphoresis or vomiting.  No cough or fever runny nose recently.  Has had bilateral leg swelling after she was diagnosed with nephrotic syndrome.  Reports being compliant with her Eliquis twice daily for her atrial fibrillation.  No history of DVT or PE or MI.  Does not believe that she has had an immediate family member with an MI but parents died at a young age so she is unsure.  Denies any smoking history.  Did see her primary care doctor who referred her to the emergency department for additional evaluation.       Home Medications Prior to Admission medications   Medication Sig Start Date End Date Taking? Authorizing Provider  apixaban (ELIQUIS) 5 MG TABS tablet TAKE 1 TABLET BY MOUTH TWICE A DAY 03/06/22  Yes Lyn Records, MD  B Complex-C (B-COMPLEX WITH VITAMIN C) tablet Take 1 tablet by mouth daily.   Yes [provider]  Calcium-Vitamin D (CALTRATE 600 PLUS-VIT D PO) Take 1 tablet by mouth in the morning and at bedtime.   Yes [provider]  levothyroxine (SYNTHROID) 25 MCG tablet Take 25 mcg by mouth every morning. 04/14/22  Yes [provider]  loratadine (CLARITIN) 10 MG tablet Take 10 mg by mouth daily.   Yes [provider]  losartan (COZAAR) 50 MG tablet TAKE  1 TABLET BY MOUTH EVERY DAY 12/27/21  Yes Lyn Records, MD  metoprolol succinate (TOPROL-XL) 25 MG 24 hr tablet Take 1 tablet (25 mg total) by mouth daily. 05/24/22  Yes Zannie Cove, MD  potassium chloride SA (KLOR-CON M) 20 MEQ tablet Take 2 tablets (40 mEq total) by mouth daily. 05/23/22  Yes Zannie Cove, MD  rosuvastatin (CRESTOR) 10 MG tablet Take 1 tablet (10 mg total) by mouth daily. 05/25/22 06/24/22 Yes Zigmund Daniel., MD  torsemide (DEMADEX) 20 MG tablet Take 2 tablets (40 mg total) by mouth 2 (two) times daily. 05/23/22  Yes Zannie Cove, MD  ciprofloxacin-dexamethasone [facility]) OTIC suspension Place 4 drops into the left ear once a week. Patient not taking: Reported on 06/05/2022 11/13/21   [provider]      Allergies    Patient has no known allergies.    Review of Systems   Review of Systems  Physical Exam Updated Vital Signs BP 124/70   Pulse 77   Temp 98.2 F (36.8 C) (Oral)   Resp 17   Ht 4\' 9"  (1.448 m)   Wt 58.1 kg   SpO2 98%   BMI 27.70 kg/m  Physical Exam Vitals and nursing note reviewed.  Constitutional:      General: She is not in acute distress.    Appearance: She is well-developed.  HENT:     Head: Normocephalic and atraumatic.  Right Ear: External ear normal.     Left Ear: External ear normal.     Nose: Nose normal.  Eyes:     Extraocular Movements: Extraocular movements intact.     Conjunctiva/sclera: Conjunctivae normal.     Pupils: Pupils are equal, round, and reactive to light.  Cardiovascular:     Rate and Rhythm: Normal rate and regular rhythm.     Heart sounds: No murmur heard. Pulmonary:     Effort: Pulmonary effort is normal. No respiratory distress.     Breath sounds: Normal breath sounds.  Abdominal:     General: Abdomen is flat. There is no distension.     Palpations: Abdomen is soft. There is no mass.     Tenderness: There is no abdominal tenderness. There is no guarding.  Musculoskeletal:     Cervical  back: Normal range of motion and neck supple.     Right lower leg: Edema (1+) present.     Left lower leg: Edema (1+) present.  Skin:    General: Skin is warm and dry.  Neurological:     Mental Status: She is alert and oriented to person, place, and time. Mental status is at baseline.  Psychiatric:        Mood and Affect: Mood normal.     ED Results / Procedures / Treatments   Labs (all labs ordered are listed, but only abnormal results are displayed) Labs Reviewed  BRAIN NATRIURETIC PEPTIDE - Abnormal; Notable for the following components:      Result Value   B Natriuretic Peptide 259.1 (*)    All other components within normal limits  COMPREHENSIVE METABOLIC PANEL - Abnormal; Notable for the following components:   Sodium 130 (*)    BUN 31 (*)    Calcium 8.4 (*)    Total Protein 6.0 (*)    Albumin 1.9 (*)    Total Bilirubin 0.2 (*)    All other components within normal limits  D-DIMER, QUANTITATIVE - Abnormal; Notable for the following components:   D-Dimer, Quant 1.09 (*)    All other components within normal limits  CBC  TROPONIN I (HIGH SENSITIVITY)  TROPONIN I (HIGH SENSITIVITY)    EKG None  Radiology CT Angio Chest PE W and/or Wo Contrast  Result Date: 06/05/2022 CLINICAL DATA:  Chest pain EXAM: CT ANGIOGRAPHY CHEST WITH CONTRAST TECHNIQUE: Multidetector CT imaging of the chest was performed using the standard protocol during bolus administration of intravenous contrast. Multiplanar CT image reconstructions and MIPs were obtained to evaluate the vascular anatomy. RADIATION DOSE REDUCTION: This exam was performed according to the departmental dose-optimization program which includes automated exposure control, adjustment of the mA and/or kV according to patient size and/or use of iterative reconstruction technique. CONTRAST:  75 mL OMNIPAQUE IOHEXOL 350 MG/ML SOLN COMPARISON:  None Available. FINDINGS: Cardiovascular: Satisfactory opacification of the pulmonary  arteries to the segmental level. No evidence of pulmonary embolism. Atheromatous changes of the aorta and its branches. Mild cardiomegaly noted. No pericardial effusion. Mediastinum/Nodes: No enlarged mediastinal, hilar, or axillary lymph nodes. Thyroid gland, trachea, and esophagus demonstrate no significant findings. Lungs/Pleura: Lungs are clear. No pleural effusion or pneumothorax. Upper Abdomen: No acute abnormality. Musculoskeletal: No chest wall abnormality. No acute or significant osseous findings. Thoracic and thoracolumbar degenerative changes are noted. Review of the MIP images confirms the above findings. IMPRESSION: 1. No PE. 2. Mild cardiomegaly. Electronically Signed   By: Sammie Bench M.D.   On: 06/05/2022 16:02   DG Chest  2 View  Result Date: 06/05/2022 CLINICAL DATA:  77 year old female with history of left-sided chest pain for the past 2 days. EXAM: CHEST - 2 VIEW COMPARISON:  Chest x-ray 05/23/2022. FINDINGS: Lung volumes are normal. No consolidative airspace disease. No pleural effusions. No pneumothorax. No pulmonary nodule or mass noted. Pulmonary vasculature and the cardiomediastinal silhouette are within normal limits. Atherosclerosis in the thoracic aorta. IMPRESSION: 1.  No radiographic evidence of acute cardiopulmonary disease. 2. Aortic atherosclerosis. Electronically Signed   By: Vinnie Langton M.D.   On: 06/05/2022 11:52    Procedures Procedures   Medications Ordered in ED Medications  aspirin chewable tablet 324 mg (324 mg Oral Given 06/05/22 1203)  iohexol (OMNIPAQUE) 350 MG/ML injection 75 mL (75 mLs Intravenous Contrast Given 06/05/22 1556)    ED Course/ Medical Decision Making/ A&P            HEART Score: 4                Medical Decision Making Amount and/or Complexity of Data Reviewed Labs: ordered. Radiology: ordered.  Risk Prescription drug management.   Rita Myers is a 77 y.o. female with comorbidities that complicate the patient evaluation  including heart failure with reduced ejection fraction, hypertension, atrial fibrillation on Eliquis, and nephrotic syndrome (membranous glomerulonephritis) who presents to the emergency department with chest pain.    Initial Ddx:  PE, MI, CHF exacerbation  MDM:  Initially was concerned the patient could have pulmonary embolism given her shortness of breath and sharp chest pain in the setting of recent diagnosis of her nephrotic syndrome which increases her risk of blood clots.  Also has several risk factors for MI so we will work her up for this as well.  Could potentially represent pleurisy from lung infection so will obtain chest x-ray to evaluate for this.  Plan:  Labs Troponin BNP D-dimer Chest x-ray  ED Summary/Re-evaluation:  Patient underwent the above workup which was reassuring.  She was found to have mildly elevated BNP and cardiomegaly on her CT scan.  Feel that her symptoms may be somewhat related to heart failure.  Already follows with cardiology.  She has a heart score of 4 so I did have an at length discussion with the patient and her daughter regarding disposition.  They would prefer to follow-up with cardiology as an outpatient so a referral was placed for cardiology to contact them regarding additional testing such as stress test.  Also instructed to follow-up with her primary doctor in several days.  This patient presents to the ED for concern of complaints listed in HPI, this involves an extensive number of treatment options, and is a complaint that carries with it a high risk of complications and morbidity. Disposition including potential need for admission considered.   Dispo: DC Home. Return precautions discussed including, but not limited to, those listed in the AVS. Allowed pt time to ask questions which were answered fully prior to dc.  Additional history obtained from daughter Records reviewed Outpatient Clinic Notes The following labs were independently  interpreted: CBC and show no acute abnormality I independently reviewed the following imaging with scope of interpretation limited to determining acute life threatening conditions related to emergency care: Chest x-ray and CT Chest and agree with the radiologist interpretation with the following exceptions: None I personally reviewed and interpreted cardiac monitoring: normal sinus rhythm  I personally reviewed and interpreted the pt's EKG: see above for interpretation  I have reviewed the patients home medications and  made adjustments as needed  Final Clinical Impression(s) / ED Diagnoses Final diagnoses:  Chest pain, unspecified type    Rx / DC Orders ED Discharge Orders          Ordered    Ambulatory referral to Cardiology       Comments: Chest pain, evaluate for stress test   06/05/22 1647              Fransico Meadow, MD 06/05/22 1750

## 2022-06-05 NOTE — ED Notes (Signed)
Patient transported to CT 

## 2022-06-05 NOTE — Discharge Instructions (Addendum)
You were seen for your chest pain in the emergency department.   Follow-up with your primary doctor in 2-3 days regarding your visit.  Cardiology will call you regarding a referral for additional testing.  Return immediately to the emergency department if you experience any of the following: severe chest pain, difficulty breathing, or any other concerning symptoms.    Thank you for visiting our Emergency Department. It was a pleasure taking care of you today.

## 2022-06-05 NOTE — ED Triage Notes (Signed)
Went for a well visit today and started complaining of left sided chest pain that goes into her left shoulder.  Reports sob yesterday but only mild today.  Denies n/v/diaphoresis.

## 2022-06-07 ENCOUNTER — Encounter: Payer: Medicare Other | Admitting: Physical Therapy

## 2022-06-08 DIAGNOSIS — N022 Recurrent and persistent hematuria with diffuse membranous glomerulonephritis: Secondary | ICD-10-CM | POA: Diagnosis not present

## 2022-06-08 DIAGNOSIS — I10 Essential (primary) hypertension: Secondary | ICD-10-CM | POA: Diagnosis not present

## 2022-06-08 DIAGNOSIS — N049 Nephrotic syndrome with unspecified morphologic changes: Secondary | ICD-10-CM | POA: Diagnosis not present

## 2022-06-09 ENCOUNTER — Encounter: Payer: Medicare Other | Admitting: Rehabilitative and Restorative Service Providers"

## 2022-06-13 ENCOUNTER — Encounter: Payer: Medicare Other | Admitting: Physical Therapy

## 2022-06-13 ENCOUNTER — Ambulatory Visit (INDEPENDENT_AMBULATORY_CARE_PROVIDER_SITE_OTHER): Payer: Medicare Other | Admitting: Student

## 2022-06-13 ENCOUNTER — Other Ambulatory Visit (HOSPITAL_COMMUNITY)
Admission: RE | Admit: 2022-06-13 | Discharge: 2022-06-13 | Disposition: A | Discharge status: Home / Self Care | Payer: Medicare Other | Source: Ambulatory Visit | Attending: Internal Medicine | Admitting: Internal Medicine

## 2022-06-13 ENCOUNTER — Encounter: Payer: Self-pay | Admitting: Student

## 2022-06-13 VITALS — BP 139/74 | HR 99 | Wt 129.9 lb

## 2022-06-13 DIAGNOSIS — I480 Paroxysmal atrial fibrillation: Secondary | ICD-10-CM | POA: Diagnosis not present

## 2022-06-13 DIAGNOSIS — E039 Hypothyroidism, unspecified: Secondary | ICD-10-CM | POA: Diagnosis not present

## 2022-06-13 DIAGNOSIS — Z01419 Encounter for gynecological examination (general) (routine) without abnormal findings: Secondary | ICD-10-CM | POA: Insufficient documentation

## 2022-06-13 DIAGNOSIS — E7800 Pure hypercholesterolemia, unspecified: Secondary | ICD-10-CM

## 2022-06-13 DIAGNOSIS — I110 Hypertensive heart disease with heart failure: Secondary | ICD-10-CM | POA: Diagnosis not present

## 2022-06-13 DIAGNOSIS — N042 Nephrotic syndrome with diffuse membranous glomerulonephritis, unspecified: Secondary | ICD-10-CM

## 2022-06-13 DIAGNOSIS — I5020 Unspecified systolic (congestive) heart failure: Secondary | ICD-10-CM

## 2022-06-13 DIAGNOSIS — I10 Essential (primary) hypertension: Secondary | ICD-10-CM

## 2022-06-13 DIAGNOSIS — E785 Hyperlipidemia, unspecified: Secondary | ICD-10-CM

## 2022-06-13 DIAGNOSIS — Z1151 Encounter for screening for human papillomavirus (HPV): Secondary | ICD-10-CM | POA: Insufficient documentation

## 2022-06-13 DIAGNOSIS — N045 Nephrotic syndrome with diffuse mesangiocapillary glomerulonephritis: Secondary | ICD-10-CM | POA: Diagnosis not present

## 2022-06-13 MED ORDER — METOPROLOL SUCCINATE ER 25 MG PO TB24: 25.0000 mg | ORAL_TABLET | Freq: Every day | ORAL | 11 refills | Status: DC

## 2022-06-13 MED ORDER — LEVOTHYROXINE SODIUM 25 MCG PO TABS: 25.0000 ug | ORAL_TABLET | Freq: Every morning | ORAL | 1 refills | Status: DC

## 2022-06-13 MED ORDER — POTASSIUM CHLORIDE CRYS ER 20 MEQ PO TBCR: 20.0000 meq | EXTENDED_RELEASE_TABLET | Freq: Every day | ORAL | 11 refills | Status: DC

## 2022-06-13 MED ORDER — APIXABAN 5 MG PO TABS: 5.0000 mg | ORAL_TABLET | Freq: Two times a day (BID) | ORAL | 11 refills | Status: DC

## 2022-06-13 MED ORDER — TORSEMIDE 20 MG PO TABS: 20.0000 mg | ORAL_TABLET | Freq: Every day | ORAL | 11 refills | Status: DC

## 2022-06-13 MED ORDER — LOSARTAN POTASSIUM 100 MG PO TABS: 50.0000 mg | ORAL_TABLET | Freq: Every day | ORAL | 3 refills | Status: DC

## 2022-06-13 MED ORDER — ROSUVASTATIN CALCIUM 10 MG PO TABS: 10.0000 mg | ORAL_TABLET | Freq: Every day | ORAL | 11 refills | Status: DC

## 2022-06-13 NOTE — Assessment & Plan Note (Signed)
BNP (last 3 results) Recent Labs    05/17/22 1927 06/05/22 1210  BNP 283.2* 259.1*   TTE EF of 40-45%   9.2 L negative since admission, now on oral torsemide  for nephrotic syndrome, R atrial pressure not suggestive of LE edema 2/2 cardiac abnormalities. However, cardiomyopathy of unknown etiology. Inpatient cardiology suggested CTA to r/p CAD. She recently presented to [facility] for HFU with chest pain; no ST changes on EKG, however, HEART score of 4. She was evaluated in the ED with normal work up. Since, she has had similar chest pain without radiation, SOB, diaphoresis, or altered MS. Monitoring weight daily, has ranged ~125.5 at home. Patient euvolemic on exam with mild LE edema. -Continue losartan, metoprolol  -consider cardiac CTA per cards  -follow up with cardiology on 2/15

## 2022-06-13 NOTE — Patient Instructions (Addendum)
Thank you, Ms.Giannamarie Nicoll for allowing [place] to provide your care today. Today we discussed  Your recent ED visit Your nephrology visit - we will be getting the records from them Your changes in Losartan and Torsemide Medication refills And your screening tests - Pap smear - Colonoscopy - Mammography  I will do more research to see if there is any other screening test that we need need to do  I also would like you to ask your nephrologist to fax any medical record at the end of your visits with them Fax [phone]  I would like you to come nack in a month or after your appointment with Nephrology, whatever comes last, so that we can follow up on your other medical conditions like your thyroid.  I have ordered the following labs for you:  Lab Orders  No laboratory test(s) ordered today     I will call if any are abnormal. All of your labs can be accessed through "My Chart".  Follow up  Please make sure to arrive 15 minutes prior to your next appointment. If you arrive late, you may be asked to reschedule.    We look forward to seeing you next time. Please call our clinic at [phone] if you have any questions or concerns. The best time to call is Monday-Friday from 9am-4pm, but there is someone available 24/7. If after hours or the weekend, call the main hospital number and ask for the Internal Medicine Resident On-Call. If you need medication refills, please notify your pharmacy one week in advance and they will send [place] a request.   Thank you for letting us take part in your care. Wishing you the best!  Romana Juniper, MD 06/13/2022, 3:56 PM [facility] Internal Medicine Resident, PGY-1

## 2022-06-13 NOTE — Assessment & Plan Note (Addendum)
Patient presenting with LE, proteinuria and hematuria on U/A, found to have nephrotic syndrome: U protein/Cr ration 5.2 , Albumin 1.5, HLD, HTN. Normal Cr and GRF. SPEP unremarkable, immunofixation with mildly elevated kappa light chains. 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 Patient has is being followed by nephrology; increased Losartan from 50 mg to 100 mg daily. Undergoing insurance authorization to start Rituximab. She has been compliant with medications, edema has been more controlled with once daily 20 mg torsemide. Continues to have frothy urine.  THSD7A can be associated with primary membranous glomerulonephritis, but it could also be secondary to malignancies. Nephrology has requested our patient to be screening for reported malignancies associated with this pathology, including breast, cervical, and colorectal cancer, which, according to reports, can precede or be diagnosed as soon as one year after GN diagnosis.      Latest Ref Rng & Units 06/05/2022   12:10 PM 05/24/2022   12:13 AM 05/23/2022   12:42 AM  BMP  Glucose 70 - 99 mg/dL 97  95  112   BUN 8 - 23 mg/dL 31  37  41   Creatinine 0.44 - 1.00 mg/dL 0.93  0.85  0.92   Sodium 135 - 145 mmol/L 130  137  137   Potassium 3.5 - 5.1 mmol/L 4.6  3.7  4.0   Chloride 98 - 111 mmol/L 98  102  102   CO2 22 - 32 mmol/L 25  27  25   $ Calcium 8.9 - 10.3 mg/dL 8.4  7.9  8.0     Discussed with patient preparation for screening studies, and she wished to proceed with tests. -No labs today; renal function remains wnl -Pap smear performed today -Ambulatory referral for digital mammogram -Ambulatory referral for GI, colonoscopy  Addendum: Pap smear normal for age Colonoscopy scheduled for 2/27 Rituximab infusions scheduled for 3/1 and 3/16 per nephrology

## 2022-06-13 NOTE — Assessment & Plan Note (Signed)
Controlled on ARB 

## 2022-06-13 NOTE — Assessment & Plan Note (Signed)
On levothyroxine 25 mcg daily , dose has been stable for at least one year. Last TSH prior to recent admission was 6.51 in 10/2020. 06/2022 TSH (5.415)  Patient received 37.5 mg dose while in the hospital. However, patient has been taking the same 25 mcg dose as this was available at home. We did not get to talk about this during this OV. Will have patient return in 4 weeks to follow up on this discussion as we unfortunately ran out of time. -Follow up in 4 weeks -Will call family and update dose if patient's have not picked up medication. If she has, please address at next OV.

## 2022-06-13 NOTE — Assessment & Plan Note (Signed)
Stable. No symptoms per patient. On anticoagulation without symptoms or signs of bleeding.  -Continue Toprol -Continue Eliquis 5 mg BID -Refilled today

## 2022-06-13 NOTE — Progress Notes (Addendum)
Subjective:  CC: Follow up  HPI:  Rita Myers is a 77 y.o. 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 Of note, patient was seen at [facility] ~1 week ago and found to have chest pain. She was initially evaluated here and sent to the ED for a HEART of 4 and suspicious history of ACS. Patient was discharged same day with negative work up. She is to follow up with Cardiology outpatient, which has already been scheduled. She is here with her daughter. Please see problem based assessment and plan for additional details.  Past Medical History:  Diagnosis Date   Aortic insufficiency    Essential hypertension 07/11/2017   Hyperlipemia    Hypertension    Hypokalemia 07/11/2017   Mitral regurgitation    Nausea, vomiting, and diarrhea 07/11/2017   Paroxysmal A-fib ([place]) 07/11/2017   Pulmonary hypertension ([place])    Syncope, vasovagal 07/11/2017    Current Outpatient Medications on File Prior to Visit  Medication Sig Dispense Refill   loratadine (CLARITIN) 10 MG tablet Take 10 mg by mouth daily.     No current facility-administered medications on file prior to visit.    Family History  Problem Relation Age of Onset   Diabetes Mother    Diabetes Sister    Diabetes Brother     Social History   Socioeconomic History   Marital status: Married    Spouse name: Not on file   Number of children: Not on file   Years of education: Not on file   Highest education level: Not on file  Occupational History   Not on file  Tobacco Use   Smoking status: Never   Smokeless tobacco: Never  Vaping Use   Vaping Use: Never used  Substance and Sexual Activity   Alcohol use: No   Drug use: No   Sexual  activity: Not on file  Other Topics Concern   Not on file  Social History Narrative   Not on file   Social Determinants of Health   Financial Resource Strain: Not on file  Food Insecurity: No Food Insecurity (05/19/2022)   Hunger Vital Sign    Worried About Running Out of Food in the Last Year: Never true    Ran Out of Food in the Last Year: Never true  Transportation Needs: No Transportation Needs (05/19/2022)   PRAPARE - Hydrologist (Medical): No    Lack of Transportation (Non-Medical): No  Physical Activity: Not on file  Stress: Not on file  Social Connections: Not on file  Intimate Partner Violence: Not At Risk (05/19/2022)   Humiliation, Afraid, Rape, and Kick questionnaire    Fear of Current or Ex-Partner: No    Emotionally Abused: No    Physically Abused: No    Sexually Abused: No    Review of Systems: ROS negative except for what is noted on the assessment and plan.  Objective:   Vitals:   06/13/22 1451  BP: 139/74  Pulse: 99  SpO2: 100%  Weight: 129 lb 14.4 oz (58.9 kg)    Physical Exam: Constitutional: well-appearing elderly woman laying in bed, in no acute distress HENT: normocephalic atraumatic, mucous membranes moist Eyes: conjunctiva non-erythematous Neck: supple, no JVD present Cardiovascular:  regular rate and rhythm, no m/r/g, 2+ and symmetrical Radial and DP pulses. Pulmonary/Chest: normal work of breathing on room air, lungs clear to auscultation bilaterally Abdominal: soft, non-tender, non-distended, no suprapubic tenderness MSK: normal bulk, 1+ pitting edema on bilateral LE Neurological: alert & oriented x 3, moving all extremities with mild discomfort on L knee, mild antalgic gait without use of walker Skin: warm and dry Psych: Pleasant mood and affect   Physical Exam Exam conducted with a chaperone present.  Genitourinary:    General: Normal vulva.     Pubic Area: No rash.      Labia:        Right: Tenderness  present. No rash, lesion or injury.        Left: Lesion present. No rash, tenderness or injury.      Urethra: No urethral swelling or urethral lesion.     Vagina: Prolapsed vaginal walls present. No vaginal discharge, erythema, tenderness, bleeding or lesions.     Uterus: Not tender.      Comments: Patient with significant tenderness on labia and Lesion p on L labia. Mild prolapse of posterior wall of distal 1/3 of vagina.  No distinct transition zone noted on exam. PAP smear sample collected.    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 Normal Cr and GRF. SPEP unremarkable, immunofixation with mildly elevated kappa light chains. 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 Patient has is being followed by nephrology; increased Losartan from 50 mg to 100 mg daily. Undergoing insurance authorization to start Rituximab. She has been compliant with medications, edema has been more controlled with once daily 20 mg torsemide. Continues to have frothy urine.  THSD7A can be associated with primary membranous glomerulonephritis, but it could also be secondary to malignancies. Nephrology has requested our patient to be screening for reported malignancies associated with this pathology, including breast, cervical, and colorectal cancer, which, according to reports, can precede or be diagnosed as soon as one year after GN diagnosis.      Latest Ref Rng & Units 06/05/2022   12:10 PM 05/24/2022   12:13 AM 05/23/2022   12:42 AM  BMP  Glucose 70 - 99 mg/dL 97  95  112   BUN 8 - 23 mg/dL 31  37  41   Creatinine 0.44 - 1.00 mg/dL 0.93  0.85  0.92   Sodium 135 - 145 mmol/L 130  137  137   Potassium 3.5 - 5.1 mmol/L 4.6  3.7  4.0   Chloride 98 - 111 mmol/L 98  102  102   CO2 22 - 32 mmol/L 25  27  25    $ Calcium 8.9 - 10.3 mg/dL 8.4  7.9  8.0     Discussed with patient preparation for screening studies, and she wished to proceed with tests. -No labs today; renal function remains wnl -Pap smear performed today -Ambulatory referral for digital mammogram -Ambulatory referral for GI, colonoscopy  Addendum: Pap smear normal for age Colonoscopy scheduled for 2/27 Rituximab infusions scheduled for 3/1 and 3/16 per nephrology  Heart failure with reduced ejection fraction ([place]) BNP (last 3 results) Recent Labs    05/17/22 1927 06/05/22 1210  BNP 283.2* 259.1*   TTE EF of 40-45%   9.2 L negative since admission, now on oral torsemide  for nephrotic syndrome, R atrial pressure not suggestive  of LE edema 2/2 cardiac abnormalities. However, cardiomyopathy of unknown etiology. Inpatient cardiology suggested CTA to r/p CAD. She recently presented to [facility] for HFU with chest pain; no ST changes on EKG, however, HEART score of 4. She was evaluated in the ED with normal work up. Since, she has had similar chest pain without radiation, SOB, diaphoresis, or altered MS. Monitoring weight daily, has ranged ~125.5 at home. Patient euvolemic on exam with mild LE edema. -Continue losartan, metoprolol  -consider cardiac CTA per cards  -follow up with cardiology on 2/15   Paroxysmal atrial fibrillation (HCC) Stable. No symptoms per patient. On anticoagulation without symptoms or signs of bleeding.  -Continue Toprol -Continue Eliquis 5 mg BID -Refilled today   Pure hypercholesterolemia Tchol 277, TG 166, HDL77, LDL 167  ASCVD risk 30%  Patient with nephrotic syndrome 2/2 membranous GN, increasing risk of hyperlipidemia. Patient is tolerating statin daily - Now on Crestor   Hypothyroidism On levothyroxine 25 mcg daily , dose has been stable for at least one year. Last TSH prior to recent admission was 6.51 in 10/2020. 06/2022 TSH (5.415)  Patient received 37.5 mg dose while in the hospital. However,  patient has been taking the same 25 mcg dose as this was available at home. We did not get to talk about this during this OV. Will have patient return in 4 weeks to follow up on this discussion as we unfortunately ran out of time. -Follow up in 4 weeks -Will call family and update dose if patient's have not picked up medication. If she has, please address at next OV.   Essential hypertension Controlled on ARB   Return in about 4 weeks (around 07/11/2022) for Hypothyroidism, Nephrology F/u, medication management.  Patient seen with Dr. Jimmye Norman

## 2022-06-13 NOTE — Assessment & Plan Note (Addendum)
Tchol 277, TG 166, HDL77, LDL 167  ASCVD risk 30%  Patient with nephrotic syndrome 2/2 membranous GN, increasing risk of hyperlipidemia. Patient is tolerating statin daily - Now on Crestor

## 2022-06-15 ENCOUNTER — Ambulatory Visit: Payer: Medicare Other | Admitting: Gastroenterology

## 2022-06-15 VITALS — BP 146/62 | HR 85 | Ht <= 58 in | Wt 129.0 lb

## 2022-06-15 DIAGNOSIS — N049 Nephrotic syndrome with unspecified morphologic changes: Secondary | ICD-10-CM | POA: Diagnosis not present

## 2022-06-15 DIAGNOSIS — I480 Paroxysmal atrial fibrillation: Secondary | ICD-10-CM

## 2022-06-15 DIAGNOSIS — Z7901 Long term (current) use of anticoagulants: Secondary | ICD-10-CM

## 2022-06-15 DIAGNOSIS — Z1211 Encounter for screening for malignant neoplasm of colon: Secondary | ICD-10-CM

## 2022-06-15 MED ORDER — NA SULFATE-K SULFATE-MG SULF 17.5-3.13-1.6 GM/177ML PO SOLN: ORAL | 0 refills | Status: DC

## 2022-06-15 NOTE — Telephone Encounter (Signed)
[facility] Pre-operative Risk Assessment     Request for surgical clearance:     Endoscopy Procedure  What type of surgery is being performed?     Colonoscopy  When is this surgery scheduled?     2/27  What type of clearance is required ?   Pharmacy  Are there any medications that need to be held prior to surgery and how long? Eliquis 2 days   Practice name and name of physician performing surgery?      [facility]  What is your office phone and fax number?      Phone- [phone]  [phone]  Anesthesia type (None, local, MAC, general) ?       MAC

## 2022-06-15 NOTE — Patient Instructions (Addendum)
You have been scheduled for a colonoscopy. Please follow written instructions given to you at your visit today.  Please pick up your prep supplies at the pharmacy within the next 1-3 days. If you use inhalers (even only as needed), please bring them with you on the day of your procedure.   We have sent the following medications to your pharmacy for you to pick up at your convenience: [place] will be contacted by our office prior to your procedure for directions on holding your ELIQUIS.  If you do not hear from our office 1 week prior to your scheduled procedure, please call [phone] to discuss.  Due to recent changes in healthcare laws, you may see the results of your imaging and laboratory studies on MyChart before your provider has had a chance to review them.  We understand that in some cases there may be results that are confusing or concerning to you. Not all laboratory results come back in the same time frame and the provider may be waiting for multiple results in order to interpret others.  Please give [place] 48 hours in order for your provider to thoroughly review all the results before contacting the office for clarification of your results.    Thank you for choosing [facility]  Karleen Hampshire Nand[place]am,MD

## 2022-06-15 NOTE — Progress Notes (Signed)
Internal Medicine Clinic Attending  I saw and evaluated the patient.  I personally confirmed the key portions of the history and exam documented by Dr. Gomez-Caraballo and I reviewed pertinent patient test results.  The assessment, diagnosis, and plan were formulated together and I agree with the documentation in the resident's note.  

## 2022-06-15 NOTE — Progress Notes (Signed)
Rita Myers    [id number]    11-30-1945  Primary Care Physician:Gomez-Caraballo, Verdis Frederickson, MD  Referring Physician: Romana Juniper, MD [place],  [place] [address]   Chief complaint:  Colonoscopy  HPI:  77 year old very pleasant female here for new patient visit accompanied by her daughter to discuss colorectal cancer screening/colonoscopy She was recently diagnosed with nephrotic syndrome/ membranous glomerulonephritis during hospitalization.  She is on diuretics and is being followed by nephrology.  She was referred here to undergo screening colonoscopy to exclude colorectal cancer. Patient has no specific GI complaints. Denies any nausea, vomiting, abdominal pain, melena or bright red blood per rectum  No family history of GI malignancy.  Other relevant medical history includes paroxysmal A-fib on chronic anticoagulation, hypertension and hyperlipidemia  Outpatient Encounter Medications as of 06/15/2022  Medication Sig   apixaban (ELIQUIS) 5 MG TABS tablet Take 1 tablet (5 mg total) by mouth 2 (two) times daily.   loratadine (CLARITIN) 10 MG tablet Take 10 mg by mouth daily.   losartan (COZAAR) 100 MG tablet Take 0.5 tablets (50 mg total) by mouth daily. (Patient taking differently: Take 100 mg by mouth daily.)   metoprolol succinate (TOPROL-XL) 25 MG 24 hr tablet Take 1 tablet (25 mg total) by mouth daily.   potassium chloride SA (KLOR-CON M) 20 MEQ tablet Take 1 tablet (20 mEq total) by mouth daily.   rosuvastatin (CRESTOR) 10 MG tablet Take 1 tablet (10 mg total) by mouth daily.   torsemide (DEMADEX) 20 MG tablet Take 1 tablet (20 mg total) by mouth daily for 365 doses. (Patient taking differently: Take 40 mg by mouth daily.)   [DISCONTINUED] levothyroxine (SYNTHROID) 25 MCG tablet Take 1 tablet (25 mcg total) by mouth every morning.   [DISCONTINUED] B Complex-C (B-COMPLEX WITH VITAMIN C) tablet Take 1 tablet by mouth daily.   [DISCONTINUED]  Calcium-Vitamin D (CALTRATE 600 PLUS-VIT D PO) Take 1 tablet by mouth in the morning and at bedtime.   [DISCONTINUED] ciprofloxacin-dexamethasone (CIPRODEX) OTIC suspension Place 4 drops into the left ear once a week. (Patient not taking: Reported on 06/05/2022)   No facility-administered encounter medications on file as of 06/15/2022.    Allergies as of 06/15/2022   (No Known Allergies)    Past Medical History:  Diagnosis Date   Aortic insufficiency    Essential hypertension 07/11/2017   Hyperlipemia    Hypertension    Hypokalemia 07/11/2017   Mitral regurgitation    Nausea, vomiting, and diarrhea 07/11/2017   Paroxysmal A-fib ([place]) 07/11/2017   Pulmonary hypertension ([place])    Syncope, vasovagal 07/11/2017    No past surgical history on file.  Family History  Problem Relation Age of Onset   Diabetes Mother    Diabetes Sister    Diabetes Brother     Social History   Socioeconomic History   Marital status: Married    Spouse name: Not on file   Number of children: Not on file   Years of education: Not on file   Highest education level: Not on file  Occupational History   Not on file  Tobacco Use   Smoking status: Never   Smokeless tobacco: Never  Vaping Use   Vaping Use: Never used  Substance and Sexual Activity   Alcohol use: No   Drug use: No   Sexual activity: Not on file  Other Topics Concern   Not on file  Social History Narrative   Not  on file   Social Determinants of Health   Financial Resource Strain: Not on file  Food Insecurity: No Food Insecurity (05/19/2022)   Hunger Vital Sign    Worried About Running Out of Food in the Last Year: Never true    Ran Out of Food in the Last Year: Never true  Transportation Needs: No Transportation Needs (05/19/2022)   PRAPARE - Hydrologist (Medical): No    Lack of Transportation (Non-Medical): No  Physical Activity: Not on file  Stress: Not on file  Social Connections: Not on file   Intimate Partner Violence: Not At Risk (05/19/2022)   Humiliation, Afraid, Rape, and Kick questionnaire    Fear of Current or Ex-Partner: No    Emotionally Abused: No    Physically Abused: No    Sexually Abused: No      Review of systems: All other review of systems negative except as mentioned in the HPI.   Physical Exam: Vitals:   06/15/22 1104  BP: (Abnormal) 146/62  Pulse: 85   Body mass index is 28.92 kg/m. Gen:      No acute distress HEENT:  sclera anicteric Abd:      soft, non-tender; no palpable masses, no distension Ext:    No edema Neuro: alert and oriented x 3 Psych: normal mood and affect  Data Reviewed:  Reviewed labs, radiology imaging, old records and pertinent past GI work up   Assessment and Plan/Recommendations:  77 year old very pleasant female with paroxysmal A-fib, on chronic anticoagulation with Eliquis, cardiomyopathy, hypertension,, recent diagnosis of nephrotic syndrome/glomerulonephritis here to discuss colonoscopy for colorectal cancer screening  Scheduled for colonoscopy The risks and benefits as well as alternatives of endoscopic procedure(s) have been discussed and reviewed. All questions answered. The patient agrees to proceed.  Will request for clearance from cardiology to hold Eliquis for 1 to 2 days prior to the procedure  Return as needed  The patient was provided an opportunity to ask questions and all were answered. The patient agreed with the plan and demonstrated an understanding of the instructions.  Damaris Hippo , MD    CC: Romana Juniper

## 2022-06-16 ENCOUNTER — Encounter: Payer: Medicare Other | Admitting: Physical Therapy

## 2022-06-16 NOTE — Telephone Encounter (Signed)
OK, thank you.

## 2022-06-16 NOTE — Telephone Encounter (Signed)
She will need a office visit prior to final clearance given recent ED visit.  She is currently scheduled to see Diona Browner NP on 2/15

## 2022-06-16 NOTE — Telephone Encounter (Signed)
FYI Dr Silverio Decamp, Patient needs to see cardiologist first has an appointment

## 2022-06-16 NOTE — Telephone Encounter (Signed)
Clinical pharmacist to review Eliquis 

## 2022-06-19 LAB — CYTOLOGY - PAP
Comment: NEGATIVE
Diagnosis: NEGATIVE
High risk HPV: NEGATIVE

## 2022-06-19 NOTE — Telephone Encounter (Signed)
Patient's daughter Sylvester Harder informed and wrote down when to hold the Eliquis.

## 2022-06-19 NOTE — Telephone Encounter (Addendum)
I called Rowena back and told her that they will confirm the Eliquis hold at [facility] appointment 06/29/2022 with cardiology. Rowena will call us back and let us know what they say.    I spoke with Rowena today and she said we need to cancel the procedure as her Mom is too weak. She had to go to the ED recently and she is seeing [facility] today at 11:10AM. I will inform Dr Silverio Decamp.

## 2022-06-19 NOTE — Telephone Encounter (Signed)
Patient with diagnosis of afib on Eliquis for anticoagulation.    Procedure: colonoscopy Date of procedure: 07/11/22  CHA2DS2-VASc Score = 5  This indicates a 7.2% annual risk of stroke. The patient's score is based upon: CHF History: 1 HTN History: 1 Diabetes History: 0 Stroke History: 0 Vascular Disease History: 0 Age Score: 2 Gender Score: 1   Prior DVT, resolved on 04/18/22 doppler  CrCl 37mL/min Platelet count 225K  Per office protocol, patient can hold Eliquis for 2 days prior to procedure as requested.    **This guidance is not considered finalized until pre-operative APP has relayed final recommendations.**

## 2022-06-23 NOTE — Progress Notes (Signed)
Negative pap smear and HR HPV - Results shared with patient's daughter.

## 2022-06-27 ENCOUNTER — Emergency Department (HOSPITAL_COMMUNITY): Payer: Medicare Other

## 2022-06-27 ENCOUNTER — Other Ambulatory Visit: Payer: Self-pay

## 2022-06-27 ENCOUNTER — Emergency Department (HOSPITAL_COMMUNITY)
Admission: EM | Admit: 2022-06-27 | Discharge: 2022-06-27 | Disposition: A | Discharge status: Home / Self Care | Payer: Medicare Other | Attending: Emergency Medicine | Admitting: Emergency Medicine

## 2022-06-27 DIAGNOSIS — Z7901 Long term (current) use of anticoagulants: Secondary | ICD-10-CM | POA: Diagnosis not present

## 2022-06-27 DIAGNOSIS — Z1152 Encounter for screening for COVID-19: Secondary | ICD-10-CM | POA: Diagnosis not present

## 2022-06-27 DIAGNOSIS — R29818 Other symptoms and signs involving the nervous system: Secondary | ICD-10-CM | POA: Diagnosis not present

## 2022-06-27 DIAGNOSIS — E876 Hypokalemia: Secondary | ICD-10-CM | POA: Diagnosis not present

## 2022-06-27 DIAGNOSIS — I6782 Cerebral ischemia: Secondary | ICD-10-CM | POA: Insufficient documentation

## 2022-06-27 DIAGNOSIS — I959 Hypotension, unspecified: Secondary | ICD-10-CM | POA: Diagnosis not present

## 2022-06-27 DIAGNOSIS — R55 Syncope and collapse: Secondary | ICD-10-CM | POA: Insufficient documentation

## 2022-06-27 DIAGNOSIS — E871 Hypo-osmolality and hyponatremia: Secondary | ICD-10-CM | POA: Diagnosis not present

## 2022-06-27 DIAGNOSIS — E860 Dehydration: Secondary | ICD-10-CM | POA: Insufficient documentation

## 2022-06-27 DIAGNOSIS — R110 Nausea: Secondary | ICD-10-CM | POA: Diagnosis not present

## 2022-06-27 DIAGNOSIS — G4489 Other headache syndrome: Secondary | ICD-10-CM | POA: Diagnosis not present

## 2022-06-27 LAB — MAGNESIUM: Magnesium: 2.2 mg/dL (ref 1.7–2.4)

## 2022-06-27 LAB — I-STAT CHEM 8, ED
BUN: 46 mg/dL — ABNORMAL HIGH (ref 8–23)
Calcium, Ion: 0.99 mmol/L — ABNORMAL LOW (ref 1.15–1.40)
Chloride: 97 mmol/L — ABNORMAL LOW (ref 98–111)
Creatinine, Ser: 0.9 mg/dL (ref 0.44–1.00)
Glucose, Bld: 118 mg/dL — ABNORMAL HIGH (ref 70–99)
HCT: 35 % — ABNORMAL LOW (ref 36.0–46.0)
Hemoglobin: 11.9 g/dL — ABNORMAL LOW (ref 12.0–15.0)
Potassium: 5 mmol/L (ref 3.5–5.1)
Sodium: 126 mmol/L — ABNORMAL LOW (ref 135–145)
TCO2: 25 mmol/L (ref 22–32)

## 2022-06-27 LAB — TROPONIN I (HIGH SENSITIVITY)
Troponin I (High Sensitivity): 9 ng/L (ref <18)
Troponin I (High Sensitivity): 10 ng/L (ref <18)

## 2022-06-27 LAB — URINALYSIS, ROUTINE W REFLEX MICROSCOPIC
Bilirubin Urine: NEGATIVE
Glucose, UA: NEGATIVE mg/dL
Ketones, ur: NEGATIVE mg/dL
Leukocytes,Ua: NEGATIVE
Nitrite: NEGATIVE
Protein, ur: >=300 mg/dL — AB
Specific Gravity, Urine: 1.01 (ref 1.005–1.030)
pH: 6 (ref 5.0–8.0)

## 2022-06-27 LAB — CBC
HCT: 33.9 % — ABNORMAL LOW (ref 36.0–46.0)
Hemoglobin: 11.5 g/dL — ABNORMAL LOW (ref 12.0–15.0)
MCH: 30.7 pg (ref 26.0–34.0)
MCHC: 33.9 g/dL (ref 30.0–36.0)
MCV: 90.6 fL (ref 80.0–100.0)
Platelets: 160 10*3/uL (ref 150–400)
RBC: 3.74 MIL/uL — ABNORMAL LOW (ref 3.87–5.11)
RDW: 12.6 % (ref 11.5–15.5)
WBC: 7.3 10*3/uL (ref 4.0–10.5)
nRBC: 0 % (ref 0.0–0.2)

## 2022-06-27 LAB — PROTIME-INR
INR: 1.1 (ref 0.8–1.2)
Prothrombin Time: 14 seconds (ref 11.4–15.2)

## 2022-06-27 LAB — COMPREHENSIVE METABOLIC PANEL
ALT: 18 U/L (ref 0–44)
AST: 31 U/L (ref 15–41)
Albumin: 1.6 g/dL — ABNORMAL LOW (ref 3.5–5.0)
Alkaline Phosphatase: 34 U/L — ABNORMAL LOW (ref 38–126)
Anion gap: 8 (ref 5–15)
BUN: 33 mg/dL — ABNORMAL HIGH (ref 8–23)
CO2: 23 mmol/L (ref 22–32)
Calcium: 7.7 mg/dL — ABNORMAL LOW (ref 8.9–10.3)
Chloride: 96 mmol/L — ABNORMAL LOW (ref 98–111)
Creatinine, Ser: 0.84 mg/dL (ref 0.44–1.00)
GFR, Estimated: >60 mL/min (ref >60)
Glucose, Bld: 129 mg/dL — ABNORMAL HIGH (ref 70–99)
Potassium: 4.5 mmol/L (ref 3.5–5.1)
Sodium: 127 mmol/L — ABNORMAL LOW (ref 135–145)
Total Bilirubin: 0.2 mg/dL — ABNORMAL LOW (ref 0.3–1.2)
Total Protein: 4.5 g/dL — ABNORMAL LOW (ref 6.5–8.1)

## 2022-06-27 LAB — APTT: aPTT: 36 seconds (ref 24–36)

## 2022-06-27 LAB — RESP PANEL BY RT-PCR (RSV, FLU A&B, COVID)  RVPGX2
Influenza A by PCR: NEGATIVE
Influenza B by PCR: NEGATIVE
Resp Syncytial Virus by PCR: NEGATIVE
SARS Coronavirus 2 by RT PCR: NEGATIVE

## 2022-06-27 LAB — DIFFERENTIAL
Abs Immature Granulocytes: 0.03 10*3/uL (ref 0.00–0.07)
Basophils Absolute: 0 10*3/uL (ref 0.0–0.1)
Basophils Relative: 0 %
Eosinophils Absolute: 0 10*3/uL (ref 0.0–0.5)
Eosinophils Relative: 0 %
Immature Granulocytes: 0 %
Lymphocytes Relative: 27 %
Lymphs Abs: 2 10*3/uL (ref 0.7–4.0)
Monocytes Absolute: 0.6 10*3/uL (ref 0.1–1.0)
Monocytes Relative: 9 %
Neutro Abs: 4.6 10*3/uL (ref 1.7–7.7)
Neutrophils Relative %: 64 %

## 2022-06-27 LAB — PHOSPHORUS: Phosphorus: 4.4 mg/dL (ref 2.5–4.6)

## 2022-06-27 MED ORDER — SODIUM CHLORIDE 0.9 % IV BOLUS
250.0000 mL | Freq: Once | INTRAVENOUS | Status: AC
  Administered 2022-06-27: 250 mL via INTRAVENOUS

## 2022-06-27 NOTE — ED Notes (Addendum)
Patient reports earlier today she was having a hard time getting her words out and her legs went numb and she was unable to stand up. Back to baseline at this time.

## 2022-06-27 NOTE — ED Provider Notes (Signed)
[facility] Provider Note   CSN: [id number] Arrival date & time: 06/27/22  1457     History  Chief Complaint  Patient presents with   Loss of Consciousness    Rita Myers is a 77 y.o. female.  HPI Patient reports at lunchtime she quite abruptly got very weak.  She was with her daughter at home and they had made some lunch.  She reports she was heading towards the table and quite suddenly she felt like she was going to pass out.  She sank to the floor.  Her daughter was at her side and prevented her from actually collapsing.  She did not have complete loss of consciousness.  The patient reports she could perceive her vision going dark and think she was very close to complete loss of consciousness.  She reports her children were talking to her and trying to see if she could get up.  She reports that she felt that both legs were too weak to stand.  She did not have 1 leg more weak than the other.  She reports just after this episode she had a bad headache.  She reports it hurt in her head and the back of her neck.  Reports the headache has since abated.  Patient does take Eliquis for atrial fibrillation.  She had a recent hospitalization about 4 weeks ago and underwent significant diuresis.  Denies she had chest pain with this episode.  She did not perceive her heart racing or skipping.  After identifying hyponatremia, patient's daughter also reports that they are on a strict sodium restriction diet and the patient is not consuming any sodium in her diet.    Home Medications Prior to Admission medications   Medication Sig Start Date End Date Taking? Authorizing Provider  apixaban (ELIQUIS) 5 MG TABS tablet Take 1 tablet (5 mg total) by mouth 2 (two) times daily. 06/13/22 06/13/23  Romana Juniper, MD  loratadine (CLARITIN) 10 MG tablet Take 10 mg by mouth daily.    [provider]  losartan (COZAAR) 100 MG tablet Take 0.5 tablets (50  mg total) by mouth daily. Patient taking differently: Take 100 mg by mouth daily. 06/13/22 06/13/23  Romana Juniper, MD  metoprolol succinate (TOPROL-XL) 25 MG 24 hr tablet Take 1 tablet (25 mg total) by mouth daily. 06/13/22 06/13/23  Romana Juniper, MD  Na Sulfate-K Sulfate-Mg Sulf 17.5-3.13-1.6 GM/177ML SOLN As directed by your pharmacy 06/15/22   Mauri Pole, MD  potassium chloride SA (KLOR-CON M) 20 MEQ tablet Take 1 tablet (20 mEq total) by mouth daily. 06/13/22 06/13/23  Romana Juniper, MD  rosuvastatin (CRESTOR) 10 MG tablet Take 1 tablet (10 mg total) by mouth daily. 06/13/22 06/13/23  Romana Juniper, MD  torsemide (DEMADEX) 20 MG tablet Take 1 tablet (20 mg total) by mouth daily for 365 doses. Patient taking differently: Take 40 mg by mouth daily. 06/13/22 06/13/23  Romana Juniper, MD  levothyroxine (SYNTHROID) 25 MCG tablet Take 1 tablet (25 mcg total) by mouth every morning. 06/13/22 06/15/22  Romana Juniper, MD      Allergies    Patient has no known allergies.    Review of Systems   Review of Systems  Physical Exam Updated Vital Signs BP 113/61   Pulse 76   Temp 98.4 F (36.9 C)   Resp 15   Ht 4' 8"$  (1.422 m)   Wt 58.5 kg   SpO2 100%   BMI 28.91 kg/m  Physical  Exam Constitutional:      Comments: Alert nontoxic.  Fatigued in appearance.  No respiratory distress.  HENT:     Head: Normocephalic and atraumatic.     Mouth/Throat:     Pharynx: Oropharynx is clear.  Eyes:     Extraocular Movements: Extraocular movements intact.     Conjunctiva/sclera: Conjunctivae normal.     Pupils: Pupils are equal, round, and reactive to light.  Cardiovascular:     Comments: Heart regular with intermittent ectopy. Pulmonary:     Effort: Pulmonary effort is normal.     Breath sounds: Normal breath sounds.  Abdominal:     General: There is no distension.     Palpations: Abdomen is soft.     Tenderness: There is no abdominal tenderness.  There is no guarding.  Musculoskeletal:     Cervical back: Neck supple.     Comments: No peripheral edema.  Calves are soft and nontender.  Patient has crenulated dry skin on the feet.  I discussed with the patient and her daughter, they report she did have significant edema previously at the time of her hospitalization.  That has since all resolved.  Skin:    General: Skin is warm and dry.     Coloration: Skin is pale.  Neurological:     Comments: Patient is alert.  She is answering questions appropriately.  Speech is normal.  No evidence of expressive or receptive aphasia.  Cranial nerves II through XII intact.  Patient can perform finger-nose exam bilaterally.  Grip strength symmetric.  No pronator drift.  Patient can elevate each lower extremity off of the bed independently and hold.  Sensation intact bilaterally to light touch  Psychiatric:        Mood and Affect: Mood normal.     ED Results / Procedures / Treatments   Labs (all labs ordered are listed, but only abnormal results are displayed) Labs Reviewed  CBC - Abnormal; Notable for the following components:      Result Value   RBC 3.74 (*)    Hemoglobin 11.5 (*)    HCT 33.9 (*)    All other components within normal limits  COMPREHENSIVE METABOLIC PANEL - Abnormal; Notable for the following components:   Sodium 127 (*)    Chloride 96 (*)    Glucose, Bld 129 (*)    BUN 33 (*)    Calcium 7.7 (*)    Total Protein 4.5 (*)    Albumin 1.6 (*)    Alkaline Phosphatase 34 (*)    Total Bilirubin 0.2 (*)    All other components within normal limits  URINALYSIS, ROUTINE W REFLEX MICROSCOPIC - Abnormal; Notable for the following components:   APPearance HAZY (*)    Hgb urine dipstick MODERATE (*)    Protein, ur >=300 (*)    Bacteria, UA RARE (*)    All other components within normal limits  I-STAT CHEM 8, ED - Abnormal; Notable for the following components:   Sodium 126 (*)    Chloride 97 (*)    BUN 46 (*)    Glucose, Bld 118  (*)    Calcium, Ion 0.99 (*)    Hemoglobin 11.9 (*)    HCT 35.0 (*)    All other components within normal limits  RESP PANEL BY RT-PCR (RSV, FLU A&B, COVID)  RVPGX2  PROTIME-INR  APTT  DIFFERENTIAL  MAGNESIUM  PHOSPHORUS  TROPONIN I (HIGH SENSITIVITY)  TROPONIN I (HIGH SENSITIVITY)    EKG EKG Interpretation  Date/Time:  Tuesday June 27 2022 15:36:20 EST Ventricular Rate:  85 PR Interval:  156 QRS Duration: 90 QT Interval:  405 QTC Calculation: 482 R Axis:   35 Text Interpretation: Sinus arrhythmia no sig change from previous Confirmed by Charlesetta Shanks [phone]) on 06/27/2022 10:55:15 PM  Radiology MR ANGIO HEAD WO CONTRAST  Result Date: 06/27/2022 CLINICAL DATA:  Neuro deficit, acute, stroke suspected.  Syncope. EXAM: MRI HEAD WITHOUT CONTRAST MRA HEAD WITHOUT CONTRAST MRA NECK WITHOUT CONTRAST TECHNIQUE: Multiplanar, multiecho pulse sequences of the brain and surrounding structures were obtained without intravenous contrast. Angiographic images of the Circle of Willis were obtained using MRA technique without intravenous contrast. Angiographic images of the neck were obtained using MRA technique without intravenous contrast. Carotid stenosis measurements (when applicable) are obtained utilizing NASCET criteria, using the distal internal carotid diameter as the denominator. COMPARISON:  Head CT 06/27/2022 FINDINGS: MRI HEAD FINDINGS Brain: There is no evidence of an acute infarct, intracranial hemorrhage, mass, midline shift, or extra-axial fluid collection. Small T2 hyperintensities in the cerebral white matter bilaterally are nonspecific but compatible with mild chronic small vessel ischemic disease. A chronic lacunar infarct is noted in the right caudate nucleus. There is mild cerebral atrophy. Vascular: Major intracranial vascular flow voids are preserved. Skull and upper cervical spine: Unremarkable bone marrow signal. Sinuses/Orbits: Unremarkable orbits. Minimal mucosal  thickening in the paranasal sinuses. Left mastoid and middle ear effusions. Other: None. MRA HEAD FINDINGS The intracranial vertebral arteries are widely patent to the basilar with the right being moderately dominant. Patent left PICA, right AICA, and bilateral SCA origins are visualized. The basilar artery is widely patent. Posterior communicating arteries are diminutive or absent. Both PCAs are patent without evidence of a significant proximal stenosis. The internal carotid arteries are widely patent from skull base to carotid termini. ACAs and MCAs are patent without evidence of a proximal branch occlusion or significant proximal stenosis. No aneurysm is identified. MRA NECK FINDINGS Standard 3 vessel aortic arch.  Widely patent arch vessel origins. The common carotid and cervical internal carotid arteries are patent without evidence of stenosis or dissection. The vertebral arteries are patent with antegrade flow bilaterally and without evidence of a significant stenosis or dissection. The right vertebral artery is dominant. IMPRESSION: 1. No acute intracranial abnormality. 2. Mild chronic small vessel ischemic disease. 3. Negative head MRA. 4. Negative neck MRA. Electronically Signed   By: Logan Bores M.D.   On: 06/27/2022 18:56   MR ANGIO NECK WO CONTRAST  Result Date: 06/27/2022 CLINICAL DATA:  Neuro deficit, acute, stroke suspected.  Syncope. EXAM: MRI HEAD WITHOUT CONTRAST MRA HEAD WITHOUT CONTRAST MRA NECK WITHOUT CONTRAST TECHNIQUE: Multiplanar, multiecho pulse sequences of the brain and surrounding structures were obtained without intravenous contrast. Angiographic images of the Circle of Willis were obtained using MRA technique without intravenous contrast. Angiographic images of the neck were obtained using MRA technique without intravenous contrast. Carotid stenosis measurements (when applicable) are obtained utilizing NASCET criteria, using the distal internal carotid diameter as the denominator.  COMPARISON:  Head CT 06/27/2022 FINDINGS: MRI HEAD FINDINGS Brain: There is no evidence of an acute infarct, intracranial hemorrhage, mass, midline shift, or extra-axial fluid collection. Small T2 hyperintensities in the cerebral white matter bilaterally are nonspecific but compatible with mild chronic small vessel ischemic disease. A chronic lacunar infarct is noted in the right caudate nucleus. There is mild cerebral atrophy. Vascular: Major intracranial vascular flow voids are preserved. Skull and upper cervical spine: Unremarkable bone marrow signal. Sinuses/Orbits:  Unremarkable orbits. Minimal mucosal thickening in the paranasal sinuses. Left mastoid and middle ear effusions. Other: None. MRA HEAD FINDINGS The intracranial vertebral arteries are widely patent to the basilar with the right being moderately dominant. Patent left PICA, right AICA, and bilateral SCA origins are visualized. The basilar artery is widely patent. Posterior communicating arteries are diminutive or absent. Both PCAs are patent without evidence of a significant proximal stenosis. The internal carotid arteries are widely patent from skull base to carotid termini. ACAs and MCAs are patent without evidence of a proximal branch occlusion or significant proximal stenosis. No aneurysm is identified. MRA NECK FINDINGS Standard 3 vessel aortic arch.  Widely patent arch vessel origins. The common carotid and cervical internal carotid arteries are patent without evidence of stenosis or dissection. The vertebral arteries are patent with antegrade flow bilaterally and without evidence of a significant stenosis or dissection. The right vertebral artery is dominant. IMPRESSION: 1. No acute intracranial abnormality. 2. Mild chronic small vessel ischemic disease. 3. Negative head MRA. 4. Negative neck MRA. Electronically Signed   By: Logan Bores M.D.   On: 06/27/2022 18:56   MR BRAIN WO CONTRAST  Result Date: 06/27/2022 CLINICAL DATA:  Neuro  deficit, acute, stroke suspected.  Syncope. EXAM: MRI HEAD WITHOUT CONTRAST MRA HEAD WITHOUT CONTRAST MRA NECK WITHOUT CONTRAST TECHNIQUE: Multiplanar, multiecho pulse sequences of the brain and surrounding structures were obtained without intravenous contrast. Angiographic images of the Circle of Willis were obtained using MRA technique without intravenous contrast. Angiographic images of the neck were obtained using MRA technique without intravenous contrast. Carotid stenosis measurements (when applicable) are obtained utilizing NASCET criteria, using the distal internal carotid diameter as the denominator. COMPARISON:  Head CT 06/27/2022 FINDINGS: MRI HEAD FINDINGS Brain: There is no evidence of an acute infarct, intracranial hemorrhage, mass, midline shift, or extra-axial fluid collection. Small T2 hyperintensities in the cerebral white matter bilaterally are nonspecific but compatible with mild chronic small vessel ischemic disease. A chronic lacunar infarct is noted in the right caudate nucleus. There is mild cerebral atrophy. Vascular: Major intracranial vascular flow voids are preserved. Skull and upper cervical spine: Unremarkable bone marrow signal. Sinuses/Orbits: Unremarkable orbits. Minimal mucosal thickening in the paranasal sinuses. Left mastoid and middle ear effusions. Other: None. MRA HEAD FINDINGS The intracranial vertebral arteries are widely patent to the basilar with the right being moderately dominant. Patent left PICA, right AICA, and bilateral SCA origins are visualized. The basilar artery is widely patent. Posterior communicating arteries are diminutive or absent. Both PCAs are patent without evidence of a significant proximal stenosis. The internal carotid arteries are widely patent from skull base to carotid termini. ACAs and MCAs are patent without evidence of a proximal branch occlusion or significant proximal stenosis. No aneurysm is identified. MRA NECK FINDINGS Standard 3 vessel  aortic arch.  Widely patent arch vessel origins. The common carotid and cervical internal carotid arteries are patent without evidence of stenosis or dissection. The vertebral arteries are patent with antegrade flow bilaterally and without evidence of a significant stenosis or dissection. The right vertebral artery is dominant. IMPRESSION: 1. No acute intracranial abnormality. 2. Mild chronic small vessel ischemic disease. 3. Negative head MRA. 4. Negative neck MRA. Electronically Signed   By: Logan Bores M.D.   On: 06/27/2022 18:56   CT HEAD WO CONTRAST  Result Date: 06/27/2022 CLINICAL DATA:  Neuro deficit, acute, stroke suspected. Witnessed syncopal episode. EXAM: CT HEAD WITHOUT CONTRAST TECHNIQUE: Contiguous axial images were obtained from the base  of the skull through the vertex without intravenous contrast. RADIATION DOSE REDUCTION: This exam was performed according to the departmental dose-optimization program which includes automated exposure control, adjustment of the mA and/or kV according to patient size and/or use of iterative reconstruction technique. COMPARISON:  Head CT 07/11/2017. FINDINGS: Brain: No acute hemorrhage, mass effect or midline shift. Gray-white differentiation is preserved. No hydrocephalus. No extra-axial collection. Basilar cisterns are patent. Vascular: No hyperdense vessel or unexpected calcification. Skull: No calvarial fracture or suspicious bone lesion. Skull base is unremarkable. Sinuses/Orbits: Unremarkable. Other: None. IMPRESSION: No acute intracranial abnormality. Electronically Signed   By: Emmit Alexanders M.D.   On: 06/27/2022 16:48    Procedures Procedures    Medications Ordered in ED Medications  sodium chloride 0.9 % bolus 250 mL (0 mLs Intravenous Stopped 06/27/22 2155)    ED Course/ Medical Decision Making/ A&P                             Medical Decision Making Amount and/or Complexity of Data Reviewed Labs: ordered. Radiology:  ordered.   Patient presents with near syncopal episode.  She did have a cardiac hospitalization about 4 weeks ago with significant diuresis.  Reported blood pressure on EMS arrival was 60/40.  Patient was given 500 cc fluid with correction to 122/76.  Patient is anticoagulated on Eliquis.  [place] diagnosis includes intracerebral hemorrhage\CVA\dysrhythmia\metabolic derangement.  Will proceed with broad diagnostic evaluation.  At this time blood pressures are normotensive in the 130s over 70s with heart rate in the 70s.  Will proceed with workup before additional fluid administration.  Will have low threshold for additional volume resuscitation.  CT head without acute findings.  MRI obtained to rule out stroke\aneurysm or dissection with patient's sudden onset of posterior headache in conjunction with symptoms.  Negative for acute findings.  Sodium returns at 126.  BUN 46\creatinine 0.9, GFR greater than 60.  Patient had more recent diagnosis of nephrotic syndrome with recommendation for strict sodium restriction.  Patient's daughter reports that they have been extremely compliant and eliminate all sodium from her diet.  She has also continued to take her diuretic as prescribed.  This time this appears most likely combination of volume depletion dehydration with hyponatremia secondary to no dietary intake.  Will give additional fluid resuscitation of normal saline and patient may start oral intake.  Upon reassessment patient is well in appearance.  She has no symptoms and feels improved.  Vital signs are stable.  Will plan for discharge with resumption of sodium intake in diet, hydration with electrolyte solutions and they will contact the nephrologist tomorrow to discuss sodium management and patient's ongoing evaluation for nephrotic syndrome.         Final Clinical Impression(s) / ED Diagnoses Final diagnoses:  Near syncope  Dehydration  Hyponatremia    Rx / DC Orders ED Discharge  Orders     None         Charlesetta Shanks, MD 06/27/22 2304

## 2022-06-27 NOTE — ED Triage Notes (Signed)
Pt BIB EMS from home. Patient had a witnessed syncopal episode. Initial pressure was 60/40. Absent radial pulses on EMS arrival. Patient has a history of a-fib and takes eliquis, did not hit her head. Takes medication for HTN, has not missed any doses.  Pt received 18m of zofran and 500cc of fluid. 122/76 HR 70-90 A-fib  18# L AC

## 2022-06-27 NOTE — ED Notes (Signed)
Patient transported to MRI 

## 2022-06-27 NOTE — Discharge Instructions (Signed)
1.  Your sodium is low and is likely contributing to weakness and lightheadedness.  Add sodium back into your diet and call your nephrologist tomorrow to discuss management of your kidney dysfunction and sodium consumption. 2.  Hydrate with fluids that have electrolytes in them.  Do not drink excessive amounts of plain water.  You may have tea, sports rehydration drinks and dilute juices. 3.  Follow-up with your nephrologist or family doctor as soon as possible.  Return to the emergency department immediately if you have worsening symptoms or other concerning changes.

## 2022-06-27 NOTE — ED Notes (Signed)
D/c instructions reviewed with patient and patient daughter at the bedside

## 2022-06-29 ENCOUNTER — Ambulatory Visit: Payer: Medicare Other | Attending: Nurse Practitioner | Admitting: Nurse Practitioner

## 2022-06-29 ENCOUNTER — Encounter: Payer: Self-pay | Admitting: Nurse Practitioner

## 2022-06-29 ENCOUNTER — Encounter: Payer: Self-pay | Admitting: *Deleted

## 2022-06-29 VITALS — BP 130/80 | Ht <= 58 in | Wt 132.0 lb

## 2022-06-29 DIAGNOSIS — I429 Cardiomyopathy, unspecified: Secondary | ICD-10-CM

## 2022-06-29 DIAGNOSIS — I340 Nonrheumatic mitral (valve) insufficiency: Secondary | ICD-10-CM | POA: Diagnosis not present

## 2022-06-29 DIAGNOSIS — I480 Paroxysmal atrial fibrillation: Secondary | ICD-10-CM | POA: Diagnosis not present

## 2022-06-29 DIAGNOSIS — I272 Pulmonary hypertension, unspecified: Secondary | ICD-10-CM

## 2022-06-29 DIAGNOSIS — I351 Nonrheumatic aortic (valve) insufficiency: Secondary | ICD-10-CM

## 2022-06-29 DIAGNOSIS — Z87898 Personal history of other specified conditions: Secondary | ICD-10-CM | POA: Diagnosis not present

## 2022-06-29 DIAGNOSIS — I471 Supraventricular tachycardia, unspecified: Secondary | ICD-10-CM

## 2022-06-29 DIAGNOSIS — R079 Chest pain, unspecified: Secondary | ICD-10-CM

## 2022-06-29 DIAGNOSIS — N049 Nephrotic syndrome with unspecified morphologic changes: Secondary | ICD-10-CM | POA: Diagnosis not present

## 2022-06-29 DIAGNOSIS — E782 Mixed hyperlipidemia: Secondary | ICD-10-CM | POA: Diagnosis not present

## 2022-06-29 DIAGNOSIS — I10 Essential (primary) hypertension: Secondary | ICD-10-CM

## 2022-06-29 DIAGNOSIS — R000 Tachycardia, unspecified: Secondary | ICD-10-CM

## 2022-06-29 NOTE — Progress Notes (Signed)
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   Past Medical History    Past Medical History:  Diagnosis Date   Aortic insufficiency    Essential hypertension 07/11/2017   Hyperlipemia    Hypertension    Hypokalemia 07/11/2017   Mitral regurgitation    Nausea, vomiting, and diarrhea 07/11/2017   Paroxysmal A-fib ([place]) 07/11/2017   Pulmonary hypertension ([place])    Syncope, vasovagal 07/11/2017   No past surgical history on file.  Allergies  No Known Allergies   Labs/Other Studies Reviewed    The following studies were reviewed today: Echo 05/18/2022:   IMPRESSIONS    1. Left ventricular ejection fraction, by estimation, is 40 to 45%. The  left ventricle has mildly decreased function. The left ventricle  demonstrates global hypokinesis. Left ventricular diastolic parameters are  consistent with Grade II diastolic  dysfunction (pseudonormalization). Elevated left atrial pressure.   2. Right ventricular systolic function is normal. The right ventricular  size is normal. There is mildly elevated pulmonary artery systolic  pressure. The estimated right ventricular systolic pressure is [id number] mmHg.   3. Left atrial size was mildly dilated.   4. A small pericardial effusion is present.   5. The mitral valve is abnormal. Mild mitral valve regurgitation.  Moderate mitral annular calcification.   6. The aortic valve is tricuspid. Aortic valve regurgitation is trivial.  Aortic valve  sclerosis is present, with no evidence of aortic valve  stenosis.   7. The inferior vena cava is normal in size with greater than 50%  respiratory variability, suggesting right atrial pressure of 3 mmHg.   Recent Labs: 04/18/2022: NT-Pro BNP 717 05/18/2022: TSH 5.415 06/05/2022: B Natriuretic Peptide 259.1 06/27/2022: ALT 18; BUN 46; Creatinine, Ser 0.90; Hemoglobin 11.9; Magnesium 2.2; Platelets 160; Potassium 5.0; Sodium 126  Recent Lipid Panel    Component Value Date/Time   CHOL 277 (H) 05/19/2022 0055   TRIG 166 (H) 05/19/2022 0055   HDL 77 05/19/2022 0055   CHOLHDL 3.6 05/19/2022 0055   VLDL 33 05/19/2022 0055   LDLCALC 167 (H) 05/19/2022 0055    History of Present Illness    77 year old female with the above past medial history including paroxysmal atrial fibrillation, cardiomyopathy, hypertension, pulmonary hypertension by echo in 2020, 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   Echocardiogram in July 2022 showed normal LV function, G2 DD, mild mitral valve regurgitation, mild aortic insufficiency.  She traveled to [place] in 04/2022 and developed lower extremity edema.  Venous Dopplers per report showed thrombus in her left lesser saphenous vein. She was treated with short course of Lasix with no significant improvement. Follow-up venous Dopplers on 04/18/2022 showed no DVT. She presented to the ED on 05/18/2022 with progressive bilateral lower extremity edema that extended into her thighs. Cardiology was consulted. BNP was mildly elevated. She was diuresed with IV Lasix.  Lower extremity duplex was negative for DVT.  Repeat echocardiogram showed EF 40 to 45%, mildly decreased LV function, LV global hypokinesis, G2 DD, normal RV systolic function, mildly elevated PASP, small pericardial  effusion, mild mitral valve regurgitation, moderate MAC, trivial aortic valve regurgitation. It was noted that cardiac CTA should be considered  in the future to rule out coronary disease given reduced EF.  She did have a run of SVT on telemetry.  She was noted to have hypoalbuminemia and was diagnosed with nephrotic syndrome.  Nephrology was consulted.  She underwent renal biopsy, results pending.  It was felt that her lower extremity edema was multifactorial in the setting of nephrotic syndrome, possible acute on chronic systolic heart failure.  She was last seen in the office on 05/29/2022 and was stable from a cardiac standpoint. She reported significant left knee pain in the setting of severe osteoarthritis.  She denied symptoms concerning for angina.  She was evaluated in the ED in 05/2022 in the setting of chest pain.  CT angio of the chest was negative for PE.  Troponin was negative.  She was discharged home with recommendations for consideration of possible outpatient stress test.  She returned to the ED on 06/27/2022 following a syncopal episode.  She denied complete loss of consciousness.  She was hypotensive per EMS.  BP improved with fluid resuscitation.  CT/MRI were negative for acute findings.  Sodium was 126.  It was felt that her symptoms were likely in the setting of volume depletion, dehydration with combined hyponatremia.  She was advised to follow-up with nephrology.  She was discharged home in stable condition.   She presents today for follow-up accompanied by her daughter.  Since her last visit and since her recent ED visit she has been stable overall from a cardiac standpoint.  She denies any recurrent presyncope, syncope.  She has noted some increased palpitations which she describes as her heart beating fast-she states I can "hear it beating in my left ear." She denies any chest pain, dyspnea, though she does note generalized fatigue, decreased energy.  She notes that she received the results of her renal biopsy and is pending "infusions" in early March per nephrology.  She is uncertain what these infusion are for but states she has  follow-up with Dr. Joelyn Oms [facility] scheduled for next week.  Home Medications    Current Outpatient Medications  Medication Sig Dispense Refill   apixaban (ELIQUIS) 5 MG TABS tablet Take 1 tablet (5 mg total) by mouth 2 (two) times daily. 60 tablet 11   loratadine (CLARITIN) 10 MG tablet Take 10 mg by mouth daily.     losartan (COZAAR) 100 MG tablet Take 0.5 tablets (50 mg total) by mouth daily. (Patient taking differently: Take 100 mg by mouth daily.) 45 tablet 3   metoprolol succinate (TOPROL-XL) 25 MG 24 hr tablet Take 1 tablet (25 mg total) by mouth daily. 30 tablet 11   Na Sulfate-K Sulfate-Mg Sulf 17.5-3.13-1.6 GM/177ML SOLN As directed by your pharmacy 354 mL 0   potassium chloride SA (KLOR-CON M) 20 MEQ tablet Take 1 tablet (20 mEq total) by mouth daily. 30 tablet 11   rosuvastatin (CRESTOR) 10 MG tablet Take 1 tablet (10 mg total) by mouth daily. 30 tablet 11   torsemide (DEMADEX) 20 MG tablet Take 1 tablet (20 mg total) by mouth daily for 365 doses. (Patient taking differently: Take 20 mg by mouth daily as needed.) 30 tablet 11   No current facility-administered medications for this visit.     Review of Systems    She denies chest pain, dyspnea, pnd, orthopnea, n, v, dizziness, weight gain, or early satiety. All other systems reviewed  and are otherwise negative except as noted above.   Physical Exam   VS:  BP 130/80   Ht 4' 9"$  (1.448 m)   Wt 132 lb (59.9 kg)   SpO2 99%   BMI 28.56 kg/m   GEN: Well nourished, well developed, in no acute distress. HEENT: normal. Neck: Supple, no JVD, carotid bruits, or masses. Cardiac: RRR, no murmurs, rubs, or gallops. No clubbing, cyanosis, nonpitting bilateral lower extremity edema.  Radials/DP/PT 2+ and equal bilaterally.  Respiratory:  Respirations regular and unlabored, clear to auscultation bilaterally. GI: Soft, nontender, nondistended, BS + x 4. MS: no deformity or atrophy. Skin: warm and dry, no rash. Neuro:   Strength and sensation are intact. Psych: Normal affect.  Accessory Clinical Findings    ECG personally reviewed by me today -sinus tachycardia 110 bpm- no acute changes.   Lab Results  Component Value Date   WBC 7.3 06/27/2022   HGB 11.9 (L) 06/27/2022   HCT 35.0 (L) 06/27/2022   MCV 90.6 06/27/2022   PLT 160 06/27/2022   Lab Results  Component Value Date   CREATININE 0.90 06/27/2022   BUN 46 (H) 06/27/2022   NA 126 (L) 06/27/2022   K 5.0 06/27/2022   CL 97 (L) 06/27/2022   CO2 23 06/27/2022   Lab Results  Component Value Date   ALT 18 06/27/2022   AST 31 06/27/2022   ALKPHOS 34 (L) 06/27/2022   BILITOT 0.2 (L) 06/27/2022   Lab Results  Component Value Date   CHOL 277 (H) 05/19/2022   HDL 77 05/19/2022   LDLCALC 167 (H) 05/19/2022   TRIG 166 (H) 05/19/2022   CHOLHDL 3.6 05/19/2022    Lab Results  Component Value Date   HGBA1C 6.1 (H) 05/19/2022    Assessment & Plan    1. Syncope: Recent syncopal episode.  CT/MRI were negative for acute findings.  MRA of the neck showed patent carotid arteries.  Recent echo was stable.  She was noted to be hyponatremic, hypotensive.  Syncope likely occurred in the setting of dehydration, hyponatremia.  Orthostatics negative in office today.  She has noted increased palpitations, history of atrial fibrillation as below.  She reports adherence to Eliquis.  Will check 30-day event monitor.  She declines stress test at this time.  Will change torsemide to as needed use.  Encouraged adequate hydration, follow-up with nephrology.  Discussed ED precautions.   2. Cardiomyopathy/chest pain: Unclear etiology.  Echo during recent hospitalization showed EF 40 to 45%, mildly decreased LV function, LV global hypokinesis, G2 DD, normal RV systolic function, mildly elevated PASP, small pericardial effusion, mild mitral regurgitation, moderate MAC, trivial aortic valve regurgitation.  She has stable nonpitting lower extremity edema, generally euvolemic  and well compensated on exam.  She did have a recent episode of chest discomfort that prompted ED visit.  Troponin was negative, CT angio of the chest was negative for PE.  Also with recent syncopal episode likely in the setting of dehydration, hyponatremia.  Will change torsemide to as needed use for worsening edema, weight gain, shortness of breath.  We discussed possible ischemic evaluation with stress testing (she is not a good candidate for coronary CT angiogram given renal dysfunction).  Patient is uncertain whether she would wish to pursue further invasive testing.  Therefore, she would prefer to discuss ischemic evaluation with her daughters before making a decision whether or not to pursue stress test.  Reviewed ED precautions. Continue losartan, metoprolol, torsemide as needed.   3. Paroxysmal  atrial fibrillation: EKG today shows sinus tachycardia.  She does note recent increased palpitations.  When her heart rate is elevated she notes "I can hear my heart beating in my left ear."  30-day event monitor pending as above.  Continue Eliquis, metoprolol.   4. SVT/tachycardia: Noted on telemetry during hospitalization in 05/2022.  Now with recent palpitations.  Cardiac monitor pending as above.  Continue metoprolol.    5. Hypertension: BP well controlled. Continue current antihypertensive regimen.    6. Pulmonary hypertension: Stable on most recent echo.    7. Valvular heart disease: Most recent echo stable with evidence of mild mitral valve regurgitation, moderate MAC, trivial aortic valve regurgitation.    8. Nephrotic syndrome: Renal biopsy was "a little" abnormal per patient.  Request most recent office note from nephrology.  She follows with Dr. Joelyn Oms [facility].  9. Hyperlipidemia: LDL was 167 on 05/19/2022.  Recently transitioned from pravastatin to Crestor.  Consider repeat fasting lipid panel, LFTs with next follow-up appointment.   10. Disposition: Follow-up in 2 months.        Lenna Sciara, NP 06/29/2022, 6:11 PM

## 2022-06-29 NOTE — Progress Notes (Signed)
Internal Medicine Clinic Attending  I saw and evaluated the patient.  I personally confirmed the key portions of the history and exam documented by Dr. Simeon Craft and I reviewed pertinent patient test results.  The assessment, diagnosis, and plan were formulated together and I agree with the documentation in the resident's note.

## 2022-06-29 NOTE — Patient Instructions (Signed)
Medication Instructions:  Torsemide 20 mg daily as needed   *If you need a refill on your cardiac medications before your next appointment, please call your pharmacy*   Lab Work: NONE ordered at this time of appointment   If you have labs (blood work) drawn today and your tests are completely normal, you will receive your results only by: [place] (if you have MyChart) OR A paper copy in the mail If you have any lab test that is abnormal or we need to change your treatment, we will call you to review the results.   Testing/Procedures:  [employer] Cardiac Event Monitor Instructions  Your physician has requested you wear your cardiac event monitor for _____ days, (1-30). [employer] may call or text to confirm a shipping address. The monitor will be sent to a land address via UPS. [employer] will not ship a monitor to a PO BOX. It typically takes 3-5 days to receive your monitor after it has been enrolled. [employer] will assist with USPS tracking if your package is delayed. The telephone number for [employer] is [phone]. Once you have received your monitor, please review the enclosed instructions. Instruction tutorials can also be viewed under help and settings on the enclosed cell phone. Your monitor has already been registered assigning a specific monitor serial # to you.  Billing and Self Pay Discount Information  [employer] has been provided the insurance information we had on file for you.  If your insurance has been updated, please call [employer] at [phone] to provide them with your updated insurance information.   [employer] offers a discounted Self Pay option for patients who have insurance that does not cover their cardiac event monitor or patients without insurance.  The discounted cost of a Self Pay Cardiac Event Monitor would be $225.00 , if the patient contacts [employer] at [phone] within 7 days of applying the monitor to make payment arrangements.   If the patient does not contact [employer] within 7 days of applying the monitor, the cost of the cardiac event monitor will be $350.00.  Applying the monitor  Remove cell phone from case and turn it on. The cell phone works as Dealer and needs to be within [employer] of you at all times. The cell phone will need to be charged on a daily basis. We recommend you plug the cell phone into the enclosed charger at your bedside table every night.  Monitor batteries: You will receive two monitor batteries labelled #1 and #2. These are your recorders. Plug battery #2 onto the second connection on the enclosed charger. Keep one battery on the charger at all times. This will keep the monitor battery deactivated. It will also keep it fully charged for when you need to switch your monitor batteries. A small light will be blinking on the battery emblem when it is charging. The light on the battery emblem will remain on when the battery is fully charged.  Open package of a Monitor strip. Insert battery #1 into black hood on strip and gently squeeze monitor battery onto connection as indicated in instruction booklet. Set aside while preparing skin.  Choose location for your strip, vertical or horizontal, as indicated in the instruction booklet. Shave to remove all hair from location. There cannot be any lotions, oils, powders, or colognes on skin where monitor is to be applied. Wipe skin clean with enclosed Saline wipe. Dry skin completely.  Peel paper labeled #1 off the back of the Monitor strip exposing the adhesive. Place  the monitor on the chest in the vertical or horizontal position shown in the instruction booklet. One arrow on the monitor strip must be pointing upward. Carefully remove paper labeled #2, attaching remainder of strip to your skin. Try not to create any folds or wrinkles in the strip as you apply it.  Firmly press and release the circle in the center of the monitor battery. You  will hear a small beep. This is turning the monitor battery on. The heart emblem on the monitor battery will light up every 5 seconds if the monitor battery in turned on and connected to the patient securely. Do not push and hold the circle down as this turns the monitor battery off. The cell phone will locate the monitor battery. A screen will appear on the cell phone checking the connection of your monitor strip. This may read poor connection initially but change to good connection within the next minute. Once your monitor accepts the connection you will hear a series of 3 beeps followed by a climbing crescendo of beeps. A screen will appear on the cell phone showing the two monitor strip placement options. Touch the picture that demonstrates where you applied the monitor strip.  Your monitor strip and battery are waterproof. You are able to shower, bathe, or swim with the monitor on. They just ask you do not submerge deeper than 3 feet underwater. We recommend removing the monitor if you are swimming in a lake, river, or ocean.  Your monitor battery will need to be switched to a fully charged monitor battery approximately once a week. The cell phone will alert you of an action which needs to be made.  On the cell phone, tap for details to reveal connection status, monitor battery status, and cell phone battery status. The green dots indicates your monitor is in good status. A red dot indicates there is something that needs your attention.  To record a symptom, click the circle on the monitor battery. In 30-60 seconds a list of symptoms will appear on the cell phone. Select your symptom and tap save. Your monitor will record a sustained or significant arrhythmia regardless of you clicking the button. Some patients do not feel the heart rhythm irregularities. [employer] will notify us of any serious or critical events.  Refer to instruction booklet for instructions on switching batteries,  changing strips, the Do not disturb or Pause features, or any additional questions.  Call [employer] at [phone], to confirm your monitor is transmitting and record your baseline. They will answer any questions you may have regarding the monitor instructions at that time.  Returning the monitor to [place] all equipment back into blue box. Peel off strip of paper to expose adhesive and close box securely. There is a prepaid UPS shipping label on this box. Drop in a UPS drop box, or at a UPS facility like [employer]. You may also contact [employer] to arrange UPS to pick up monitor package at your home.      Follow-Up: At [facility], you and your health needs are our priority.  As part of our continuing mission to provide you with exceptional heart care, we have created designated Provider Care Teams.  These Care Teams include your primary Cardiologist (physician) and Advanced Practice Providers (APPs -  Physician Assistants and Nurse Practitioners) who all work together to provide you with the care you need, when you need it.  We recommend signing up for the patient portal called "MyChart".  Sign up information is provided on this After Visit Summary.  MyChart is used to connect with patients for Virtual Visits (Telemedicine).  Patients are able to view lab/test results, encounter notes, upcoming appointments, etc.  Non-urgent messages can be sent to your provider as well.   To learn more about what you can do with MyChart, go to [email or web address].    Your next appointment:   2 month(s)  Provider:   Kirk Ruths, MD     Other Instructions

## 2022-06-29 NOTE — Progress Notes (Unsigned)
Patient enrolled for [employer] to ship a 30 day cardiac event monitor to her address on file.

## 2022-07-03 ENCOUNTER — Encounter: Payer: Self-pay | Admitting: Gastroenterology

## 2022-07-07 DIAGNOSIS — N022 Recurrent and persistent hematuria with diffuse membranous glomerulonephritis: Secondary | ICD-10-CM | POA: Diagnosis not present

## 2022-07-07 DIAGNOSIS — R809 Proteinuria, unspecified: Secondary | ICD-10-CM | POA: Diagnosis not present

## 2022-07-07 DIAGNOSIS — N049 Nephrotic syndrome with unspecified morphologic changes: Secondary | ICD-10-CM | POA: Diagnosis not present

## 2022-07-07 DIAGNOSIS — I10 Essential (primary) hypertension: Secondary | ICD-10-CM | POA: Diagnosis not present

## 2022-07-09 ENCOUNTER — Inpatient Hospital Stay: Payer: Medicare Other | Attending: Nurse Practitioner

## 2022-07-09 DIAGNOSIS — R55 Syncope and collapse: Secondary | ICD-10-CM | POA: Diagnosis not present

## 2022-07-09 DIAGNOSIS — Z87898 Personal history of other specified conditions: Secondary | ICD-10-CM | POA: Diagnosis not present

## 2022-07-09 LAB — LAB REPORT - SCANNED
Creatinine, POC: 24.1 mg/dL
EGFR: 69
Microalb Creat Ratio: 17568

## 2022-07-10 ENCOUNTER — Encounter: Payer: Self-pay | Admitting: Nephrology

## 2022-07-11 ENCOUNTER — Encounter: Payer: Medicare Other | Admitting: Gastroenterology

## 2022-07-14 DIAGNOSIS — N022 Recurrent and persistent hematuria with diffuse membranous glomerulonephritis: Secondary | ICD-10-CM | POA: Diagnosis not present

## 2022-07-17 ENCOUNTER — Ambulatory Visit: Payer: Medicare Other

## 2022-07-17 ENCOUNTER — Encounter: Payer: Medicare Other | Admitting: Internal Medicine

## 2022-07-17 ENCOUNTER — Encounter: Payer: Medicare Other | Admitting: Student

## 2022-07-24 DIAGNOSIS — R809 Proteinuria, unspecified: Secondary | ICD-10-CM | POA: Diagnosis not present

## 2022-07-24 DIAGNOSIS — N022 Recurrent and persistent hematuria with diffuse membranous glomerulonephritis: Secondary | ICD-10-CM | POA: Diagnosis not present

## 2022-07-24 DIAGNOSIS — I10 Essential (primary) hypertension: Secondary | ICD-10-CM | POA: Diagnosis not present

## 2022-07-24 DIAGNOSIS — N049 Nephrotic syndrome with unspecified morphologic changes: Secondary | ICD-10-CM | POA: Diagnosis not present

## 2022-07-26 ENCOUNTER — Emergency Department (HOSPITAL_COMMUNITY): Payer: Medicare Other

## 2022-07-26 ENCOUNTER — Other Ambulatory Visit: Payer: Self-pay

## 2022-07-26 ENCOUNTER — Inpatient Hospital Stay (HOSPITAL_COMMUNITY)
Admission: EM | Admit: 2022-07-26 | Discharge: 2022-08-03 | DRG: 698 | Disposition: A | Discharge status: Home Health | Payer: Medicare Other | Attending: Internal Medicine | Admitting: Internal Medicine

## 2022-07-26 ENCOUNTER — Encounter (HOSPITAL_COMMUNITY): Payer: Self-pay | Admitting: Emergency Medicine

## 2022-07-26 DIAGNOSIS — Z79899 Other long term (current) drug therapy: Secondary | ICD-10-CM

## 2022-07-26 DIAGNOSIS — I480 Paroxysmal atrial fibrillation: Secondary | ICD-10-CM | POA: Diagnosis not present

## 2022-07-26 DIAGNOSIS — Z9181 History of falling: Secondary | ICD-10-CM

## 2022-07-26 DIAGNOSIS — E871 Hypo-osmolality and hyponatremia: Secondary | ICD-10-CM | POA: Diagnosis not present

## 2022-07-26 DIAGNOSIS — Z833 Family history of diabetes mellitus: Secondary | ICD-10-CM

## 2022-07-26 DIAGNOSIS — I080 Rheumatic disorders of both mitral and aortic valves: Secondary | ICD-10-CM | POA: Diagnosis present

## 2022-07-26 DIAGNOSIS — M79601 Pain in right arm: Secondary | ICD-10-CM | POA: Diagnosis present

## 2022-07-26 DIAGNOSIS — N179 Acute kidney failure, unspecified: Secondary | ICD-10-CM | POA: Diagnosis not present

## 2022-07-26 DIAGNOSIS — R202 Paresthesia of skin: Secondary | ICD-10-CM | POA: Diagnosis not present

## 2022-07-26 DIAGNOSIS — Z87441 Personal history of nephrotic syndrome: Secondary | ICD-10-CM

## 2022-07-26 DIAGNOSIS — I5040 Unspecified combined systolic (congestive) and diastolic (congestive) heart failure: Secondary | ICD-10-CM | POA: Diagnosis not present

## 2022-07-26 DIAGNOSIS — K5900 Constipation, unspecified: Secondary | ICD-10-CM | POA: Diagnosis not present

## 2022-07-26 DIAGNOSIS — I110 Hypertensive heart disease with heart failure: Secondary | ICD-10-CM | POA: Diagnosis present

## 2022-07-26 DIAGNOSIS — S199XXA Unspecified injury of neck, initial encounter: Secondary | ICD-10-CM | POA: Diagnosis not present

## 2022-07-26 DIAGNOSIS — Z79890 Hormone replacement therapy (postmenopausal): Secondary | ICD-10-CM | POA: Diagnosis not present

## 2022-07-26 DIAGNOSIS — I10 Essential (primary) hypertension: Secondary | ICD-10-CM

## 2022-07-26 DIAGNOSIS — I5023 Acute on chronic systolic (congestive) heart failure: Secondary | ICD-10-CM | POA: Diagnosis present

## 2022-07-26 DIAGNOSIS — R7303 Prediabetes: Secondary | ICD-10-CM | POA: Diagnosis present

## 2022-07-26 DIAGNOSIS — E785 Hyperlipidemia, unspecified: Secondary | ICD-10-CM | POA: Diagnosis present

## 2022-07-26 DIAGNOSIS — E039 Hypothyroidism, unspecified: Secondary | ICD-10-CM | POA: Diagnosis present

## 2022-07-26 DIAGNOSIS — N049 Nephrotic syndrome with unspecified morphologic changes: Secondary | ICD-10-CM | POA: Diagnosis not present

## 2022-07-26 DIAGNOSIS — N045 Nephrotic syndrome with diffuse mesangiocapillary glomerulonephritis: Secondary | ICD-10-CM | POA: Diagnosis not present

## 2022-07-26 DIAGNOSIS — R55 Syncope and collapse: Secondary | ICD-10-CM | POA: Diagnosis not present

## 2022-07-26 DIAGNOSIS — I272 Pulmonary hypertension, unspecified: Secondary | ICD-10-CM | POA: Diagnosis present

## 2022-07-26 DIAGNOSIS — E8809 Other disorders of plasma-protein metabolism, not elsewhere classified: Secondary | ICD-10-CM | POA: Diagnosis present

## 2022-07-26 DIAGNOSIS — N042 Nephrotic syndrome with diffuse membranous glomerulonephritis, unspecified: Secondary | ICD-10-CM

## 2022-07-26 DIAGNOSIS — Z7901 Long term (current) use of anticoagulants: Secondary | ICD-10-CM | POA: Diagnosis not present

## 2022-07-26 DIAGNOSIS — S0990XA Unspecified injury of head, initial encounter: Secondary | ICD-10-CM | POA: Diagnosis not present

## 2022-07-26 DIAGNOSIS — I429 Cardiomyopathy, unspecified: Secondary | ICD-10-CM | POA: Diagnosis not present

## 2022-07-26 DIAGNOSIS — I951 Orthostatic hypotension: Secondary | ICD-10-CM | POA: Diagnosis not present

## 2022-07-26 DIAGNOSIS — I5020 Unspecified systolic (congestive) heart failure: Secondary | ICD-10-CM | POA: Diagnosis present

## 2022-07-26 LAB — CBC WITH DIFFERENTIAL/PLATELET
Abs Immature Granulocytes: 0.07 10*3/uL (ref 0.00–0.07)
Basophils Absolute: 0 10*3/uL (ref 0.0–0.1)
Basophils Relative: 1 %
Eosinophils Absolute: 0 10*3/uL (ref 0.0–0.5)
Eosinophils Relative: 0 %
HCT: 36.3 % (ref 36.0–46.0)
Hemoglobin: 12.3 g/dL (ref 12.0–15.0)
Immature Granulocytes: 1 %
Lymphocytes Relative: 14 %
Lymphs Abs: 1.2 10*3/uL (ref 0.7–4.0)
MCH: 30.9 pg (ref 26.0–34.0)
MCHC: 33.9 g/dL (ref 30.0–36.0)
MCV: 91.2 fL (ref 80.0–100.0)
Monocytes Absolute: 0.9 10*3/uL (ref 0.1–1.0)
Monocytes Relative: 10 %
Neutro Abs: 6.5 10*3/uL (ref 1.7–7.7)
Neutrophils Relative %: 74 %
Platelets: 198 10*3/uL (ref 150–400)
RBC: 3.98 MIL/uL (ref 3.87–5.11)
RDW: 12.3 % (ref 11.5–15.5)
WBC: 8.8 10*3/uL (ref 4.0–10.5)
nRBC: 0 % (ref 0.0–0.2)

## 2022-07-26 LAB — COMPREHENSIVE METABOLIC PANEL
ALT: 18 U/L (ref 0–44)
AST: 28 U/L (ref 15–41)
Albumin: 1.6 g/dL — ABNORMAL LOW (ref 3.5–5.0)
Alkaline Phosphatase: 44 U/L (ref 38–126)
Anion gap: 11 (ref 5–15)
BUN: 32 mg/dL — ABNORMAL HIGH (ref 8–23)
CO2: 21 mmol/L — ABNORMAL LOW (ref 22–32)
Calcium: 8.6 mg/dL — ABNORMAL LOW (ref 8.9–10.3)
Chloride: 88 mmol/L — ABNORMAL LOW (ref 98–111)
Creatinine, Ser: 0.89 mg/dL (ref 0.44–1.00)
GFR, Estimated: >60 mL/min (ref >60)
Glucose, Bld: 96 mg/dL (ref 70–99)
Potassium: 4.6 mmol/L (ref 3.5–5.1)
Sodium: 120 mmol/L — ABNORMAL LOW (ref 135–145)
Total Bilirubin: 0.8 mg/dL (ref 0.3–1.2)
Total Protein: 5.2 g/dL — ABNORMAL LOW (ref 6.5–8.1)

## 2022-07-26 LAB — BRAIN NATRIURETIC PEPTIDE: B Natriuretic Peptide: 287.6 pg/mL — ABNORMAL HIGH (ref 0.0–100.0)

## 2022-07-26 LAB — LAB REPORT - SCANNED: EGFR: 80

## 2022-07-26 MED ORDER — FUROSEMIDE 10 MG/ML IJ SOLN: 60.0000 mg | Freq: Once | INTRAMUSCULAR | Status: DC

## 2022-07-26 NOTE — Progress Notes (Signed)
Chaplain paged to Trauma 2, family declined chaplain services. I provided a calm presence and offered support. No further assessment made.    07/26/22 2200  Spiritual Encounters  Type of Visit Initial  Care provided to: Pt and family  Referral source Trauma page  Reason for visit Trauma  OnCall Visit Yes  Spiritual Framework  Presenting Themes Impactful experiences and emotions  Community/Connection Family  Patient Stress Factors None identified  Family Stress Factors None identified  Interventions  Spiritual Care Interventions Made Compassionate presence  Intervention Outcomes  Outcomes Declined chaplain visit

## 2022-07-26 NOTE — ED Provider Notes (Signed)
  [facility] Provider Note   CSN: [id number] Arrival date & time: 07/26/22  2118     History Chief Complaint  Patient presents with   Loss of Consciousness    HPI Adrina Armijo is a 77 y.o. female presenting for chief complaint of syncope.  She is a 77 year old female with an extensive medical history.  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  She states that she got a call from her nephrologist that her sodium was critically low and she needed to come to the emergency room today and while she was getting ready, she stood up and syncopized falling and hitting her head.  Comes in as a level 2 trauma because of fall on thinners..   Patient's recorded medical, surgical, social, medication list and allergies were reviewed in the Snapshot window as part of the initial history.   Review of Systems   Review of Systems  Physical Exam Updated Vital Signs BP (!) 157/73 (BP Location: Right Arm)   Pulse 87   Temp 98.3 F (36.8 C) (Oral)   Resp 20   Ht 4\' 8"  (1.422 m)   Wt 65.3 kg   SpO2 99%   BMI 32.28 kg/m  Physical Exam   ED Course/ Medical Decision Making/ A&P    Procedures Procedures   Medications Ordered in ED Medications - No data to display  Medical Decision Making:    Nisha Dhami is a 77 y.o. female who presented to the ED today with *** detailed above.     {crccomplexity:27900}  Complete initial physical exam performed, notably the patient  was ***.      Reviewed and confirmed nursing documentation for past medical history, family history, social history.    Initial Assessment:   With the patient's presentation of ***, most likely diagnosis is ***. Other diagnoses were considered including (but not limited to) ***. These are considered less likely due to history of present illness and physical exam findings.   {crccopa:27899}  Initial Plan:   ***  ***Screening labs including CBC and Metabolic panel to evaluate for infectious or metabolic etiology of disease.  ***Urinalysis with reflex culture ordered to evaluate for UTI or relevant urologic/nephrologic pathology.  ***CXR to evaluate for structural/infectious intrathoracic pathology.  ***EKG to evaluate for cardiac pathology. Objective evaluation as below reviewed with plan for close reassessment  Initial Study Results:   Laboratory  All laboratory results reviewed without evidence of clinically relevant pathology.   ***Exceptions include: ***   ***EKG EKG was reviewed independently. Rate, rhythm, axis, intervals all examined and without medically relevant abnormality. ST segments without concerns for elevations.    Radiology  All images reviewed independently. ***Agree with radiology report at this time.   No results found.   Consults:  Case discussed with ***.   Final Assessment and Plan:   ***   ***  Clinical Impression: No diagnosis found.   Data Unavailable   Final Clinical Impression(s) / ED Diagnoses Final diagnoses:  None    Rx / DC Orders ED Discharge Orders     None

## 2022-07-26 NOTE — Consult Note (Addendum)
Reason for Consult: Hyponatremia Referring Physician:  Oswald Hillock, MD  Rita Myers is an 77 y.o. 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 Joelyn Oms earlier today for follow up.  She has continued to have worsening lower extremity edema and increased weight gain despite escalating doses of torsemide as an outpatient.  Labs drawn at that visit were notable for a serum sodium of 120 and she was instructed to go to [facility] ED for admission and IV diuresis due to failing outpatient diuretic therapy.  Her daughter was on her way to pick her up when she had a syncopal event in the bathroom and struck her head.  In the ED, afebrile, VSS.  Labs notable for Na 120, Cl 88, Co2 21, BUN 32, Cr 0.89, alb 1.6, Hgb 12.3.  CT scan of head and cervical spine without ICH or fracture.  CXR with no active disease.  We were consulted due to hyponatremia and need for IV diuresis related to nephrotic syndrome.  She is due for her second dose of Rituximab on 07/28/22.  She does not remember passing out but was only on the floor for a few minutes.  She reports that her head feels heavy and had some right arm pain after the fall.  She had a recent admission for a similar presentation but did not lose consciousness on 06/27/22 and had hyponatremia present at that time as well.  She denies any N/V/D, SOB, orthopnea, or PND.  Trend in Serum Sodium: Sodium  Date/Time Value Ref Range Status  07/26/2022 09:49 PM 120 (L) 135 - 145 mmol/L Final  06/27/2022 03:42 PM 126 (L) 135 - 145 mmol/L Final  06/27/2022 03:28 PM 127 (L) 135 - 145 mmol/L Final  06/05/2022 12:10 PM 130 (L) 135 - 145 mmol/L Final  05/24/2022 12:13 AM 137 135 - 145 mmol/L Final  05/23/2022 12:42 AM 137 135 - 145 mmol/L Final  05/22/2022 12:18 AM 137 135 -  145 mmol/L Final  05/21/2022 12:26 AM 138 135 - 145 mmol/L Final  05/20/2022 12:33 AM 137 135 - 145 mmol/L Final  05/19/2022 12:55 AM 140 135 - 145 mmol/L Final  05/17/2022 07:27 PM 137 135 - 145 mmol/L Final  04/18/2022 09:41 AM 139 134 - 144 mmol/L Final  12/14/2021 10:04 AM 142 134 - 144 mmol/L Final  10/27/2020 01:08 PM 141 134 - 144 mmol/L Final  02/27/2018 12:19 PM 144 134 - 144 mmol/L Final  09/04/2017 10:25 AM 140 134 - 144 mmol/L Final  07/17/2017 10:51 AM 140 134 - 144 mmol/L Final  07/12/2017 04:46 AM 145 135 - 145 mmol/L Final  07/11/2017 04:51 PM 143 135 - 145 mmol/L Final  07/11/2017 01:33 AM 139 135 - 145 mmol/L Final    PMH:   Past Medical History:  Diagnosis Date   Aortic insufficiency    Essential hypertension 07/11/2017   Hyperlipemia    Hypertension    Hypokalemia 07/11/2017   Mitral regurgitation    Nausea, vomiting, and diarrhea 07/11/2017   Paroxysmal A-fib ([place]) 07/11/2017   Pulmonary hypertension (HCC)    Syncope, vasovagal 07/11/2017    PSH:  History reviewed. No pertinent surgical history.  Allergies: No Known Allergies  Medications:   Prior to Admission medications   Medication Sig Start Date End Date Taking? Authorizing Provider  apixaban Arne Cleveland)  5 MG TABS tablet Take 1 tablet (5 mg total) by mouth 2 (two) times daily. 06/13/22 06/13/23  Romana Juniper, MD  loratadine (CLARITIN) 10 MG tablet Take 10 mg by mouth daily.    [provider]  losartan (COZAAR) 100 MG tablet Take 0.5 tablets (50 mg total) by mouth daily. Patient taking differently: Take 100 mg by mouth daily. 06/13/22 06/13/23  Romana Juniper, MD  metoprolol succinate (TOPROL-XL) 25 MG 24 hr tablet Take 1 tablet (25 mg total) by mouth daily. 06/13/22 06/13/23  Romana Juniper, MD  Na Sulfate-K Sulfate-Mg Sulf 17.5-3.13-1.6 GM/177ML SOLN As directed by your pharmacy 06/15/22   Mauri Pole, MD  potassium chloride SA (KLOR-CON M) 20 MEQ tablet Take 1  tablet (20 mEq total) by mouth daily. 06/13/22 06/13/23  Romana Juniper, MD  rosuvastatin (CRESTOR) 10 MG tablet Take 1 tablet (10 mg total) by mouth daily. 06/13/22 06/13/23  Romana Juniper, MD  torsemide (DEMADEX) 20 MG tablet Take 1 tablet (20 mg total) by mouth daily for 365 doses. Patient taking differently: Take 20 mg by mouth daily as needed. 06/13/22 06/13/23  Romana Juniper, MD  levothyroxine (SYNTHROID) 25 MCG tablet Take 1 tablet (25 mcg total) by mouth every morning. 06/13/22 06/15/22  Romana Juniper, MD    Discontinued Meds:  There are no discontinued medications.  Social History:  reports that she has never smoked. She has never used smokeless tobacco. She reports that she does not drink alcohol and does not use drugs.  Family History:   Family History  Problem Relation Age of Onset   Diabetes Mother    Diabetes Sister    Diabetes Brother     Pertinent items are noted in HPI.  Blood pressure 139/63, pulse 76, temperature 98.3 F (36.8 C), temperature source Oral, resp. rate 17, height '4\' 8"'$  (1.422 m), weight 65.3 kg, SpO2 100 %. General appearance: alert, cooperative, and no distress Head: Normocephalic, without obvious abnormality, atraumatic Resp: clear to auscultation bilaterally Cardio: irregularly irregular rhythm and no rub GI: soft, non-tender; bowel sounds normal; no masses,  no organomegaly Extremities: edema 2+ anasarca to presacral area  Labs: Basic Metabolic Panel: Recent Labs  Lab 07/26/22 2149  NA 120*  K 4.6  CL 88*  CO2 21*  GLUCOSE 96  BUN 32*  CREATININE 0.89  ALBUMIN 1.6*  CALCIUM 8.6*   Liver Function Tests: Recent Labs  Lab 07/26/22 2149  AST 28  ALT 18  ALKPHOS 44  BILITOT 0.8  PROT 5.2*  ALBUMIN 1.6*   No results for input(s): "LIPASE", "AMYLASE" in the last 168 hours. No results for input(s): "AMMONIA" in the last 168 hours. CBC: Recent Labs  Lab 07/26/22 2149  WBC 8.8  NEUTROABS 6.5  HGB 12.3   HCT 36.3  MCV 91.2  PLT 198   PT/INR: '@labrcntip'$ (inr:5) Cardiac Enzymes: No results for input(s): "CKTOTAL", "CKMB", "CKMBINDEX", "TROPONINI" in the last 168 hours. CBG: No results for input(s): "GLUCAP" in the last 168 hours.  Iron Studies: No results for input(s): "IRON", "TIBC", "TRANSFERRIN", "FERRITIN" in the last 168 hours.  Xrays/Other Studies: DG Chest Portable 1 View  Result Date: 07/26/2022 CLINICAL DATA:  Syncope EXAM: PORTABLE CHEST 1 VIEW COMPARISON:  Chest x-ray 06/05/2022 FINDINGS: The heart size and mediastinal contours are within normal limits. Both lungs are clear. The visualized skeletal structures are unremarkable. IMPRESSION: No active disease. Electronically Signed   By: Ronney Asters M.D.   On: 07/26/2022 22:39   CT HEAD WO CONTRAST (5MM)  Result Date: 07/26/2022 CLINICAL DATA:  Level 2 fall on blood thinners with head and neck trauma EXAM: CT HEAD WITHOUT CONTRAST CT CERVICAL SPINE WITHOUT CONTRAST TECHNIQUE: Multidetector CT imaging of the head and cervical spine was performed following the standard protocol without intravenous contrast. Multiplanar CT image reconstructions of the cervical spine were also generated. RADIATION DOSE REDUCTION: This exam was performed according to the departmental dose-optimization program which includes automated exposure control, adjustment of the mA and/or kV according to patient size and/or use of iterative reconstruction technique. COMPARISON:  CT and MRI of the head 06/27/2022 FINDINGS: CT HEAD FINDINGS Brain: No intracranial hemorrhage, mass effect, or evidence of acute infarct. No hydrocephalus. No extra-axial fluid collection. Generalized cerebral atrophy. Ill-defined hypoattenuation within the cerebral white matter is nonspecific but consistent with chronic small vessel ischemic disease. Vascular: No hyperdense vessel. Intracranial arterial calcification. Skull: No fracture or focal lesion. Sinuses/Orbits: No acute finding.  Paranasal sinuses and mastoid air cells are well aerated. Other: None. CT CERVICAL SPINE FINDINGS Alignment: No evidence of traumatic malalignment. Skull base and vertebrae: No acute fracture. No primary bone lesion or focal pathologic process. Soft tissues and spinal canal: No prevertebral fluid or swelling. No visible canal hematoma. Disc levels: Multilevel spondylosis, degenerative endplate change, disc space height loss which is moderate-advanced from C3-C7. Posterior disc osteophyte complexes at C3-C7 cause mild effacement of the ventral thecal sac. No high-grade spinal canal narrowing. Uncovertebral spurring and facet arthropathy cause advanced neural foraminal narrowing on the left at C5-C6 and on the left at C6-C7. Upper chest: Negative. Other: None. IMPRESSION: 1. No acute intracranial abnormality. Generalized atrophy and small vessel white matter disease. 2. No acute fracture in the cervical spine. Multilevel degenerative spondylosis. Electronically Signed   By: Placido Sou M.D.   On: 07/26/2022 22:28   CT Cervical Spine Wo Contrast  Result Date: 07/26/2022 CLINICAL DATA:  Level 2 fall on blood thinners with head and neck trauma EXAM: CT HEAD WITHOUT CONTRAST CT CERVICAL SPINE WITHOUT CONTRAST TECHNIQUE: Multidetector CT imaging of the head and cervical spine was performed following the standard protocol without intravenous contrast. Multiplanar CT image reconstructions of the cervical spine were also generated. RADIATION DOSE REDUCTION: This exam was performed according to the departmental dose-optimization program which includes automated exposure control, adjustment of the mA and/or kV according to patient size and/or use of iterative reconstruction technique. COMPARISON:  CT and MRI of the head 06/27/2022 FINDINGS: CT HEAD FINDINGS Brain: No intracranial hemorrhage, mass effect, or evidence of acute infarct. No hydrocephalus. No extra-axial fluid collection. Generalized cerebral atrophy.  Ill-defined hypoattenuation within the cerebral white matter is nonspecific but consistent with chronic small vessel ischemic disease. Vascular: No hyperdense vessel. Intracranial arterial calcification. Skull: No fracture or focal lesion. Sinuses/Orbits: No acute finding. Paranasal sinuses and mastoid air cells are well aerated. Other: None. CT CERVICAL SPINE FINDINGS Alignment: No evidence of traumatic malalignment. Skull base and vertebrae: No acute fracture. No primary bone lesion or focal pathologic process. Soft tissues and spinal canal: No prevertebral fluid or swelling. No visible canal hematoma. Disc levels: Multilevel spondylosis, degenerative endplate change, disc space height loss which is moderate-advanced from C3-C7. Posterior disc osteophyte complexes at C3-C7 cause mild effacement of the ventral thecal sac. No high-grade spinal canal narrowing. Uncovertebral spurring and facet arthropathy cause advanced neural foraminal narrowing on the left at C5-C6 and on the left at C6-C7. Upper chest: Negative. Other: None. IMPRESSION: 1. No acute intracranial abnormality. Generalized atrophy and small vessel white matter  disease. 2. No acute fracture in the cervical spine. Multilevel degenerative spondylosis. Electronically Signed   By: Placido Sou M.D.   On: 07/26/2022 22:28     Assessment/Plan:  Hypervolemic hyponatremia - due to nephrotic syndrome.  Asymptomatic.  Failed outpatient oral diuresis.  Will start IV lasix 80 mg q6 and IV albumin before next dose to help facilitate diuresis.  Low sodium diet.  Will follow sodium levels with diuresis.   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  Continue with ARB and IV lasix as above.  Will also give IV albumin prior to next furosemide dose.  Will need to arrange rituximab while she is an inpatient.  Syncope - likely orthostatic with use of diuretics and beta-blocker.   She had presyncope on 06/28/22 and was told to wait after standing and walk with assistance, however she got up without assistance.  Thankfully CT scan of head and cervical spine were unremarkable.  She would benefit from TED hose.  Continue to monitor neuro exam for changes.  Atrial fibrillation - rate controlled and on Eliquis. Chronic combines systolic and diastolic CHF - IV lasix as above.  Low sodium diet. Hypoalbuminemia - due to nephrotic syndrome. HTN - stable for now but likely has orthostatic hypotension.  Will need assistance when ambulating or going to bathroom.  Would keep in bed for now.  Nutrition - heart healthy diet.   Governor Rooks Rickelle Sylvestre 07/26/2022, 11:55 PM

## 2022-07-26 NOTE — ED Notes (Signed)
Trauma Response Nurse Documentation   Rita Myers is a 77 y.o. female arriving to [facility] ED via [place].  On Eliquis (apixaban) daily. Trauma was activated as a Level 2 by Gerald Stabs, RN based on the following trauma criteria Elderly patients > 65 with head trauma on anti-coagulation (excluding ASA). Trauma team at the bedside on patient arrival.   Patient cleared for CT by Dr. Oswald Hillock. Pt transported to CT with Primary RN present to monitor. RN remained with the patient throughout their absence from the department for clinical observation.   GCS 14, mild confusion.  History   Past Medical History:  Diagnosis Date   Aortic insufficiency    Essential hypertension 07/11/2017   Hyperlipemia    Hypertension    Hypokalemia 07/11/2017   Mitral regurgitation    Nausea, vomiting, and diarrhea 07/11/2017   Paroxysmal A-fib ([place]) 07/11/2017   Pulmonary hypertension ([place])    Syncope, vasovagal 07/11/2017     History reviewed. No pertinent surgical history.     Initial Focused Assessment (If applicable, or please see trauma documentation): Airway-- intact, no visible obstruction Breathing-- spontaneous, unlabored Circulation-- no apparent bleeding noted  CT's Completed:   CT Head and CT C-Spine   Interventions:  See event summary  Plan for disposition:  Admission to floor   Consults completed:  none at 0119.  Event Summary: Patient brought in by family from home, patient was initially being brought to ER d/t low sodium levels. Patient fell prior to leaving home and struck her head. Family reports patient is normally confused, but has been more confused lately. Manual BP obtained. Lab work obtained. Xray chest completed. Patient to CT with Primary RN for CT head and c-spine.  MTP Summary (If applicable):   Bedside handoff with ED RN Ar[place].    Trudee Kuster  Trauma Response RN  Please call TRN at [phone] for further assistance.

## 2022-07-26 NOTE — ED Triage Notes (Addendum)
Pt sent by nephrologist for low sodium levels (120) and increased leg swelling. 2lb weight gain since yesterday. Pt's family also reports loss of consciousness this evening. Endorses hitting head. On blood thinners.

## 2022-07-26 NOTE — Consult Note (Incomplete)
Reason for Consult: Hyponatremia Referring Physician:  Oswald Hillock, MD  Rita Myers is an 77 y.o. 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 Joelyn Oms earlier today for follow up.  She has continued to have worsening lower extremity edema and increased weight despite escalating doses of torsemide as an outpatient.  Labs drawn at that visit were notable for a serum sodium of 120 and she was instructed to go to [facility] ED for admission and IV diuresis due to failing outpatient diuretic therapy.  Her daughter was on her way to pick her up when she had a syncopal event in the bathroom and struck her head.  In the ED, afebrile, VSS.    Trend in Serum Sodium: Sodium  Date/Time Value Ref Range Status  07/26/2022 09:49 PM 120 (L) 135 - 145 mmol/L Final  06/27/2022 03:42 PM 126 (L) 135 - 145 mmol/L Final  06/27/2022 03:28 PM 127 (L) 135 - 145 mmol/L Final  06/05/2022 12:10 PM 130 (L) 135 - 145 mmol/L Final  05/24/2022 12:13 AM 137 135 - 145 mmol/L Final  05/23/2022 12:42 AM 137 135 - 145 mmol/L Final  05/22/2022 12:18 AM 137 135 - 145 mmol/L Final  05/21/2022 12:26 AM 138 135 - 145 mmol/L Final  05/20/2022 12:33 AM 137 135 - 145 mmol/L Final  05/19/2022 12:55 AM 140 135 - 145 mmol/L Final  05/17/2022 07:27 PM 137 135 - 145 mmol/L Final  04/18/2022 09:41 AM 139 134 - 144 mmol/L Final  12/14/2021 10:04 AM 142 134 - 144 mmol/L Final  10/27/2020 01:08 PM 141 134 - 144 mmol/L Final  02/27/2018 12:19 PM 144 134 - 144 mmol/L Final  09/04/2017 10:25 AM 140 134 - 144 mmol/L Final  07/17/2017 10:51 AM 140 134 - 144 mmol/L Final  07/12/2017 04:46 AM 145 135 - 145 mmol/L Final  07/11/2017 04:51 PM 143 135 - 145 mmol/L Final  07/11/2017 01:33 AM 139 135 - 145 mmol/L Final    PMH:   Past Medical  History:  Diagnosis Date  . Aortic insufficiency   . Essential hypertension 07/11/2017  . Hyperlipemia   . Hypertension   . Hypokalemia 07/11/2017  . Mitral regurgitation   . Nausea, vomiting, and diarrhea 07/11/2017  . Paroxysmal A-fib ([place]) 07/11/2017  . Pulmonary hypertension ([place])   . Syncope, vasovagal 07/11/2017    PSH:  History reviewed. No pertinent surgical history.  Allergies: No Known Allergies  Medications:   Prior to Admission medications   Medication Sig Start Date End Date Taking? Authorizing Provider  apixaban (ELIQUIS) 5 MG TABS tablet Take 1 tablet (5 mg total) by mouth 2 (two) times daily. 06/13/22 06/13/23  Romana Juniper, MD  loratadine (CLARITIN) 10 MG tablet Take 10 mg by mouth daily.    [provider]  losartan (COZAAR) 100 MG tablet Take 0.5 tablets (50 mg total) by mouth daily. Patient taking differently: Take 100 mg by mouth daily. 06/13/22 06/13/23  Romana Juniper, MD  metoprolol succinate (TOPROL-XL) 25 MG 24 hr tablet Take 1 tablet (25 mg total) by mouth daily. 06/13/22 06/13/23  Romana Juniper, MD  Na Sulfate-K Sulfate-Mg Sulf 17.5-3.13-1.6 GM/177ML SOLN As directed by your pharmacy 06/15/22   Mauri Pole, MD  potassium chloride SA (KLOR-CON M) 20 MEQ tablet Take 1 tablet (20 mEq total) by mouth daily.  06/13/22 06/13/23  Romana Juniper, MD  rosuvastatin (CRESTOR) 10 MG tablet Take 1 tablet (10 mg total) by mouth daily. 06/13/22 06/13/23  Romana Juniper, MD  torsemide (DEMADEX) 20 MG tablet Take 1 tablet (20 mg total) by mouth daily for 365 doses. Patient taking differently: Take 20 mg by mouth daily as needed. 06/13/22 06/13/23  Romana Juniper, MD  levothyroxine (SYNTHROID) 25 MCG tablet Take 1 tablet (25 mcg total) by mouth every morning. 06/13/22 06/15/22  Romana Juniper, MD    Discontinued Meds:  There are no discontinued medications.  Social History:  reports that she has never smoked. She  has never used smokeless tobacco. She reports that she does not drink alcohol and does not use drugs.  Family History:   Family History  Problem Relation Age of Onset  . Diabetes Mother   . Diabetes Sister   . Diabetes Brother     {ros-complete:30496}  Blood pressure 139/63, pulse 76, temperature 98.3 F (36.8 C), temperature source Oral, resp. rate 17, height '4\' 8"'$  (1.422 m), weight 65.3 kg, SpO2 100 %. {physical SA:[id number]  Labs: Basic Metabolic Panel: Recent Labs  Lab 07/26/22 2149  NA 120*  K 4.6  CL 88*  CO2 21*  GLUCOSE 96  BUN 32*  CREATININE 0.89  ALBUMIN 1.6*  CALCIUM 8.6*   Liver Function Tests: Recent Labs  Lab 07/26/22 2149  AST 28  ALT 18  ALKPHOS 44  BILITOT 0.8  PROT 5.2*  ALBUMIN 1.6*   No results for input(s): "LIPASE", "AMYLASE" in the last 168 hours. No results for input(s): "AMMONIA" in the last 168 hours. CBC: Recent Labs  Lab 07/26/22 2149  WBC 8.8  NEUTROABS 6.5  HGB 12.3  HCT 36.3  MCV 91.2  PLT 198   PT/INR: '@labrcntip'$ (inr:5) Cardiac Enzymes: No results for input(s): "CKTOTAL", "CKMB", "CKMBINDEX", "TROPONINI" in the last 168 hours. CBG: No results for input(s): "GLUCAP" in the last 168 hours.  Iron Studies: No results for input(s): "IRON", "TIBC", "TRANSFERRIN", "FERRITIN" in the last 168 hours.  Xrays/Other Studies: DG Chest Portable 1 View  Result Date: 07/26/2022 CLINICAL DATA:  Syncope EXAM: PORTABLE CHEST 1 VIEW COMPARISON:  Chest x-ray 06/05/2022 FINDINGS: The heart size and mediastinal contours are within normal limits. Both lungs are clear. The visualized skeletal structures are unremarkable. IMPRESSION: No active disease. Electronically Signed   By: Ronney Asters M.D.   On: 07/26/2022 22:39   CT HEAD WO CONTRAST (5MM)  Result Date: 07/26/2022 CLINICAL DATA:  Level 2 fall on blood thinners with head and neck trauma EXAM: CT HEAD WITHOUT CONTRAST CT CERVICAL SPINE WITHOUT CONTRAST TECHNIQUE: Multidetector CT  imaging of the head and cervical spine was performed following the standard protocol without intravenous contrast. Multiplanar CT image reconstructions of the cervical spine were also generated. RADIATION DOSE REDUCTION: This exam was performed according to the departmental dose-optimization program which includes automated exposure control, adjustment of the mA and/or kV according to patient size and/or use of iterative reconstruction technique. COMPARISON:  CT and MRI of the head 06/27/2022 FINDINGS: CT HEAD FINDINGS Brain: No intracranial hemorrhage, mass effect, or evidence of acute infarct. No hydrocephalus. No extra-axial fluid collection. Generalized cerebral atrophy. Ill-defined hypoattenuation within the cerebral white matter is nonspecific but consistent with chronic small vessel ischemic disease. Vascular: No hyperdense vessel. Intracranial arterial calcification. Skull: No fracture or focal lesion. Sinuses/Orbits: No acute finding. Paranasal sinuses and mastoid air cells are well aerated. Other: None. CT CERVICAL SPINE FINDINGS Alignment: No evidence of traumatic  malalignment. Skull base and vertebrae: No acute fracture. No primary bone lesion or focal pathologic process. Soft tissues and spinal canal: No prevertebral fluid or swelling. No visible canal hematoma. Disc levels: Multilevel spondylosis, degenerative endplate change, disc space height loss which is moderate-advanced from C3-C7. Posterior disc osteophyte complexes at C3-C7 cause mild effacement of the ventral thecal sac. No high-grade spinal canal narrowing. Uncovertebral spurring and facet arthropathy cause advanced neural foraminal narrowing on the left at C5-C6 and on the left at C6-C7. Upper chest: Negative. Other: None. IMPRESSION: 1. No acute intracranial abnormality. Generalized atrophy and small vessel white matter disease. 2. No acute fracture in the cervical spine. Multilevel degenerative spondylosis. Electronically Signed   By:  Placido Sou M.D.   On: 07/26/2022 22:28   CT Cervical Spine Wo Contrast  Result Date: 07/26/2022 CLINICAL DATA:  Level 2 fall on blood thinners with head and neck trauma EXAM: CT HEAD WITHOUT CONTRAST CT CERVICAL SPINE WITHOUT CONTRAST TECHNIQUE: Multidetector CT imaging of the head and cervical spine was performed following the standard protocol without intravenous contrast. Multiplanar CT image reconstructions of the cervical spine were also generated. RADIATION DOSE REDUCTION: This exam was performed according to the departmental dose-optimization program which includes automated exposure control, adjustment of the mA and/or kV according to patient size and/or use of iterative reconstruction technique. COMPARISON:  CT and MRI of the head 06/27/2022 FINDINGS: CT HEAD FINDINGS Brain: No intracranial hemorrhage, mass effect, or evidence of acute infarct. No hydrocephalus. No extra-axial fluid collection. Generalized cerebral atrophy. Ill-defined hypoattenuation within the cerebral white matter is nonspecific but consistent with chronic small vessel ischemic disease. Vascular: No hyperdense vessel. Intracranial arterial calcification. Skull: No fracture or focal lesion. Sinuses/Orbits: No acute finding. Paranasal sinuses and mastoid air cells are well aerated. Other: None. CT CERVICAL SPINE FINDINGS Alignment: No evidence of traumatic malalignment. Skull base and vertebrae: No acute fracture. No primary bone lesion or focal pathologic process. Soft tissues and spinal canal: No prevertebral fluid or swelling. No visible canal hematoma. Disc levels: Multilevel spondylosis, degenerative endplate change, disc space height loss which is moderate-advanced from C3-C7. Posterior disc osteophyte complexes at C3-C7 cause mild effacement of the ventral thecal sac. No high-grade spinal canal narrowing. Uncovertebral spurring and facet arthropathy cause advanced neural foraminal narrowing on the left at C5-C6 and on the  left at C6-C7. Upper chest: Negative. Other: None. IMPRESSION: 1. No acute intracranial abnormality. Generalized atrophy and small vessel white matter disease. 2. No acute fracture in the cervical spine. Multilevel degenerative spondylosis. Electronically Signed   By: Placido Sou M.D.   On: 07/26/2022 22:28     Assessment/Plan:  Broadus John A Finas Delone 07/26/2022, 11:55 PM

## 2022-07-26 NOTE — Progress Notes (Signed)
Orthopedic Tech Progress Note Patient Details:  Rita Myers 06/08/1945 [id number]  Level 2 trauma   Patient ID: Rita Myers, female   DOB: 1945-11-20, 77 y.o.   MRN: [id number]  Rita Myers 07/26/2022, 10:47 PM

## 2022-07-26 NOTE — ED Provider Triage Note (Signed)
Emergency Medicine Provider Triage Evaluation Note  Rita Myers , a 77 y.o. female  was evaluated in triage.  Pt complains of unwitnessed fall.  Patient was on her way to the ED recommended by Dr. Joelyn Oms.  Had an unwitnessed syncopal episode.  Underlying history of A-fib anticoagulated on Eliquis.  Does have 2+ pitting edema bilaterally.  Loss of consciousness.  Review of Systems  Positive: Leg swelling, syncope Negative: headache  Physical Exam  BP (!) 157/73 (BP Location: Right Arm)   Pulse 87   Temp 98.3 F (36.8 C) (Oral)   Resp 20   Ht '4\' 8"'$  (1.422 m)   Wt 65.3 kg   SpO2 99%   BMI 32.28 kg/m  Gen:   Awake, no distress   Resp:  Normal effort  MSK:   Moves extremities without difficulty  Other:    Medical Decision Making  Medically screening exam initiated at 9:44 PM.  Appropriate orders placed.  Rita Myers was informed that the remainder of the evaluation will be completed by another provider, this initial triage assessment does not replace that evaluation, and the importance of remaining in the ED until their evaluation is complete.     Rita Fitting, PA-C 07/26/22 2148

## 2022-07-27 DIAGNOSIS — R7303 Prediabetes: Secondary | ICD-10-CM | POA: Diagnosis present

## 2022-07-27 DIAGNOSIS — Z833 Family history of diabetes mellitus: Secondary | ICD-10-CM | POA: Diagnosis not present

## 2022-07-27 DIAGNOSIS — I429 Cardiomyopathy, unspecified: Secondary | ICD-10-CM | POA: Diagnosis present

## 2022-07-27 DIAGNOSIS — I110 Hypertensive heart disease with heart failure: Secondary | ICD-10-CM | POA: Diagnosis present

## 2022-07-27 DIAGNOSIS — R202 Paresthesia of skin: Secondary | ICD-10-CM | POA: Diagnosis not present

## 2022-07-27 DIAGNOSIS — I080 Rheumatic disorders of both mitral and aortic valves: Secondary | ICD-10-CM | POA: Diagnosis present

## 2022-07-27 DIAGNOSIS — I951 Orthostatic hypotension: Secondary | ICD-10-CM

## 2022-07-27 DIAGNOSIS — E871 Hypo-osmolality and hyponatremia: Secondary | ICD-10-CM | POA: Diagnosis present

## 2022-07-27 DIAGNOSIS — E8809 Other disorders of plasma-protein metabolism, not elsewhere classified: Secondary | ICD-10-CM | POA: Diagnosis present

## 2022-07-27 DIAGNOSIS — Z7901 Long term (current) use of anticoagulants: Secondary | ICD-10-CM | POA: Diagnosis not present

## 2022-07-27 DIAGNOSIS — E039 Hypothyroidism, unspecified: Secondary | ICD-10-CM | POA: Diagnosis present

## 2022-07-27 DIAGNOSIS — Z79899 Other long term (current) drug therapy: Secondary | ICD-10-CM | POA: Diagnosis not present

## 2022-07-27 DIAGNOSIS — Z79890 Hormone replacement therapy (postmenopausal): Secondary | ICD-10-CM | POA: Diagnosis not present

## 2022-07-27 DIAGNOSIS — Z87441 Personal history of nephrotic syndrome: Secondary | ICD-10-CM | POA: Diagnosis not present

## 2022-07-27 DIAGNOSIS — Z9181 History of falling: Secondary | ICD-10-CM | POA: Diagnosis not present

## 2022-07-27 DIAGNOSIS — N179 Acute kidney failure, unspecified: Secondary | ICD-10-CM | POA: Diagnosis not present

## 2022-07-27 DIAGNOSIS — N049 Nephrotic syndrome with unspecified morphologic changes: Secondary | ICD-10-CM | POA: Diagnosis not present

## 2022-07-27 DIAGNOSIS — I272 Pulmonary hypertension, unspecified: Secondary | ICD-10-CM | POA: Diagnosis present

## 2022-07-27 DIAGNOSIS — I5023 Acute on chronic systolic (congestive) heart failure: Secondary | ICD-10-CM

## 2022-07-27 DIAGNOSIS — I480 Paroxysmal atrial fibrillation: Secondary | ICD-10-CM | POA: Diagnosis present

## 2022-07-27 DIAGNOSIS — R55 Syncope and collapse: Secondary | ICD-10-CM

## 2022-07-27 DIAGNOSIS — K5900 Constipation, unspecified: Secondary | ICD-10-CM | POA: Diagnosis not present

## 2022-07-27 DIAGNOSIS — E785 Hyperlipidemia, unspecified: Secondary | ICD-10-CM | POA: Diagnosis present

## 2022-07-27 DIAGNOSIS — N045 Nephrotic syndrome with diffuse mesangiocapillary glomerulonephritis: Secondary | ICD-10-CM | POA: Diagnosis present

## 2022-07-27 DIAGNOSIS — M79601 Pain in right arm: Secondary | ICD-10-CM | POA: Diagnosis present

## 2022-07-27 LAB — SODIUM
Sodium: 126 mmol/L — ABNORMAL LOW (ref 135–145)
Sodium: 131 mmol/L — ABNORMAL LOW (ref 135–145)
Sodium: 124 mmol/L — ABNORMAL LOW (ref 135–145)

## 2022-07-27 LAB — RENAL FUNCTION PANEL
Albumin: 1.7 g/dL — ABNORMAL LOW (ref 3.5–5.0)
Anion gap: 7 (ref 5–15)
BUN: 30 mg/dL — ABNORMAL HIGH (ref 8–23)
CO2: 23 mmol/L (ref 22–32)
Calcium: 7.9 mg/dL — ABNORMAL LOW (ref 8.9–10.3)
Chloride: 93 mmol/L — ABNORMAL LOW (ref 98–111)
Creatinine, Ser: 0.84 mg/dL (ref 0.44–1.00)
GFR, Estimated: >60 mL/min (ref >60)
Glucose, Bld: 84 mg/dL (ref 70–99)
Phosphorus: 4.2 mg/dL (ref 2.5–4.6)
Potassium: 4.3 mmol/L (ref 3.5–5.1)
Sodium: 123 mmol/L — ABNORMAL LOW (ref 135–145)

## 2022-07-27 LAB — OSMOLALITY: Osmolality: 273 mOsm/kg — ABNORMAL LOW (ref 275–295)

## 2022-07-27 LAB — NA AND K (SODIUM & POTASSIUM), RAND UR
Potassium Urine: 37 mmol/L
Sodium, Ur: 27 mmol/L

## 2022-07-27 LAB — OSMOLALITY, URINE: Osmolality, Ur: 257 mOsm/kg — ABNORMAL LOW (ref 300–900)

## 2022-07-27 MED ORDER — DIPHENHYDRAMINE HCL 25 MG PO CAPS
50.0000 mg | ORAL_CAPSULE | Freq: Once | ORAL | Status: AC
  Administered 2022-07-28: 50 mg via ORAL
  Filled 2022-07-27: qty 2

## 2022-07-27 MED ORDER — ALBUTEROL SULFATE (2.5 MG/3ML) 0.083% IN NEBU: 2.5000 mg | INHALATION_SOLUTION | Freq: Once | RESPIRATORY_TRACT | Status: DC | PRN

## 2022-07-27 MED ORDER — ALBUMIN HUMAN 25 % IV SOLN
12.5000 g | Freq: Once | INTRAVENOUS | Status: AC
  Administered 2022-07-27: 12.5 g via INTRAVENOUS
  Filled 2022-07-27: qty 50

## 2022-07-27 MED ORDER — APIXABAN 5 MG PO TABS
5.0000 mg | ORAL_TABLET | Freq: Two times a day (BID) | ORAL | Status: DC
  Administered 2022-07-27 – 2022-08-03 (×16): 5 mg via ORAL
  Filled 2022-07-27 (×16): qty 1

## 2022-07-27 MED ORDER — ROSUVASTATIN CALCIUM 5 MG PO TABS
10.0000 mg | ORAL_TABLET | Freq: Every day | ORAL | Status: DC
  Administered 2022-07-27 – 2022-08-03 (×8): 10 mg via ORAL
  Filled 2022-07-27 (×8): qty 2

## 2022-07-27 MED ORDER — DEXTROSE 5 % IV SOLN: INTRAVENOUS | Status: AC

## 2022-07-27 MED ORDER — ACETAMINOPHEN 325 MG PO TABS: 650.0000 mg | ORAL_TABLET | Freq: Four times a day (QID) | ORAL | Status: DC | PRN

## 2022-07-27 MED ORDER — SODIUM CHLORIDE 0.9 % IV SOLN
1000.0000 mg | Freq: Once | INTRAVENOUS | Status: AC
  Administered 2022-07-28: 1000 mg via INTRAVENOUS
  Filled 2022-07-27: qty 100

## 2022-07-27 MED ORDER — METOPROLOL SUCCINATE ER 25 MG PO TB24
25.0000 mg | ORAL_TABLET | Freq: Every day | ORAL | Status: DC
  Administered 2022-07-27 – 2022-08-02 (×7): 25 mg via ORAL
  Filled 2022-07-27 (×7): qty 1

## 2022-07-27 MED ORDER — EPINEPHRINE PF 1 MG/ML IJ SOLN: 0.3000 mg | INTRAMUSCULAR | Status: DC | PRN

## 2022-07-27 MED ORDER — FAMOTIDINE IN NACL 20-0.9 MG/50ML-% IV SOLN: 20.0000 mg | Freq: Once | INTRAVENOUS | Status: DC | PRN

## 2022-07-27 MED ORDER — DIPHENHYDRAMINE HCL 50 MG/ML IJ SOLN: 50.0000 mg | Freq: Once | INTRAMUSCULAR | Status: DC | PRN

## 2022-07-27 MED ORDER — SENNOSIDES-DOCUSATE SODIUM 8.6-50 MG PO TABS: 1.0000 | ORAL_TABLET | Freq: Every evening | ORAL | Status: DC | PRN

## 2022-07-27 MED ORDER — SODIUM CHLORIDE 0.9 % IV BOLUS: 1000.0000 mL | Freq: Once | INTRAVENOUS | Status: DC | PRN

## 2022-07-27 MED ORDER — FUROSEMIDE 10 MG/ML IJ SOLN
80.0000 mg | Freq: Four times a day (QID) | INTRAMUSCULAR | Status: DC
  Administered 2022-07-27 (×2): 80 mg via INTRAVENOUS
  Filled 2022-07-27 (×2): qty 8

## 2022-07-27 MED ORDER — ACETAMINOPHEN 325 MG PO TABS
650.0000 mg | ORAL_TABLET | Freq: Once | ORAL | Status: AC
  Administered 2022-07-28: 650 mg via ORAL
  Filled 2022-07-27: qty 2

## 2022-07-27 MED ORDER — METHYLPREDNISOLONE SODIUM SUCC 125 MG IJ SOLR: 125.0000 mg | Freq: Once | INTRAMUSCULAR | Status: DC | PRN

## 2022-07-27 MED ORDER — LOSARTAN POTASSIUM 50 MG PO TABS
50.0000 mg | ORAL_TABLET | Freq: Every day | ORAL | Status: DC
  Administered 2022-07-27 – 2022-08-03 (×8): 50 mg via ORAL
  Filled 2022-07-27 (×8): qty 1

## 2022-07-27 MED ORDER — ACETAMINOPHEN 650 MG RE SUPP: 650.0000 mg | Freq: Four times a day (QID) | RECTAL | Status: DC | PRN

## 2022-07-27 NOTE — Progress Notes (Addendum)
Interval history  Admitted overnight after syncope and fall at home.  Found to be hyponatremic.  IV Lasix and albumin started.  Feeling okay this morning.  Denies dizziness, heart palpitations, chest pain, shortness of breath.  Loss of consciousness was very brief on the order of seconds.  May have been associated with a prodrome.  Was associated with postural change.  Afterwards, there was no postictal state.  She also reports that she carefully limits her fluid intake, per her nephrologist recommendation for hyponatremia.  Physical exam Blood pressure (!) 145/134, pulse 72, temperature (!) 97.1 F (36.2 C), temperature source Oral, resp. rate 16, height '4\' 8"'$  (1.422 m), weight 65.3 kg, SpO2 100 %.  Comfortable appearing Heart rate is normal, rhythm is irregularly irregular, radial pulses 2+, bilateral pitting edema past knees, dependent pitting edema in hips Breathing is regular and unlabored on room air, lungs are clear to auscultation Skin is warm and dry Alert and oriented, no gross neurologic deficits Pleasant, mood and affect concordant  Intake/Output Summary (Last 24 hours) at 07/27/2022 1322 Last data filed at 07/27/2022 0544 Gross per 24 hour  Intake 39.81 ml  Output 900 ml  Net -860.19 ml   Net IO Since Admission: -860.19 mL [07/27/22 1322]  Labs, images, and other studies Sodium 123 Serum osmolality 273 Urine osmolality 257 Urine sodium 27  Assessment and plan Hospital day 0  Rita Myers is a 77 y.o. with membranous glomerulopathy and nephrotic syndrome who presents after syncope with fall found to be volume overloaded with hyponatremia.  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  Recent UPC approximately 5.  Albumin 1.7.  She is diffusely edematous.  IV Lasix and albumin started after admission.  Seems to be improving.  Sodium trend is going up appropriately.  She does not appear to be symptomatic from her hyponatremia at this time.  Urine studies suggest a low ADH state with intermediate urine sodium, but they are confounded by administration of diuretics prior to collection.  Continue frequent serum sodium checks with diuresis.  2 L fluid restriction.  Also gets rituximab outpatient for her THSD7A-associated membranous nephropathy, will continue this therapy while she is here.  Appreciate nephrology input on this case. - Every 4 hour serum sodium - Daily renal function panel - Furosemide 80 mg IV every 6 hours - Rituximab tomorrow  Syncope with fall History suggests orthostatic syncope or reflex syncope.  Fall associated with prodrome of lightheadedness.  She thinks she lost consciousness but only for a matter of seconds prior to coming to on calling for help.  Her husband says she seemed normal when he got to her, making seizure less likely.  No palpitations, arrhythmia less likely.  Is wearing a 30-day event monitor, prescribed for syncope.  Do not think this can be interrogated but perhaps it caught something this time. - Continue monitoring on telemetry  Acute on Chronic systolic heart failure Cardiomyopathy of unclear etiology.  EF 40 to 45% on echo from January 2024.  No shortness of breath orthopnea, suspect volume overload secondary to renal disease rather than heart failure.  Will continue home medications for now.  Blood pressure stable. - Losartan 50 mg daily -  Metoprolol succinate 25 mg daily - Diurese as above  Atrial fibrillation Rate controlled.  Anticoagulated. - Eliquis 5 mg twice daily  Diet: Regular IVF: No fluids, diuresing VTE: Therapeutic Eliquis Code: Full PT/OT recommendations: Consulted, recommendations pending Family  Update: Husband at bedside  Discharge plan: Pending workup and treatment of syncope and hyponatremia  [name] MD 07/27/2022, 1:22 PM  Pager: [phone] After 5pm on weekdays and 1pm on weekends: [phone]

## 2022-07-27 NOTE — Progress Notes (Unsigned)
Dr. Stanford Breed to read.

## 2022-07-27 NOTE — Hospital Course (Addendum)
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

## 2022-07-27 NOTE — ED Notes (Signed)
Report called to [facility] rn

## 2022-07-27 NOTE — Progress Notes (Signed)
Subjective:  still in ER-   at least 900 of UOP with iv lasix, says she has been going a lot - sodium up to 126 Objective Vital signs in last 24 hours: Vitals:   07/27/22 0930 07/27/22 0939 07/27/22 1000 07/27/22 1021  BP: (!) 150/72 (!) 150/72 (!) 145/134   Pulse: 82  72   Resp: 17  16   Temp:    (!) 97.1 F (36.2 C)  TempSrc:    Oral  SpO2: 97%  100%   Weight:      Height:       Weight change:   Intake/Output Summary (Last 24 hours) at 07/27/2022 1255 Last data filed at 07/27/2022 0544 Gross per 24 hour  Intake 39.81 ml  Output 900 ml  Net -860.19 ml    Assessment/Plan:  Hypervolemic hyponatremia - due to nephrotic syndrome.  Asymptomatic.  Failed outpatient oral diuresis.  started IV lasix 80 mg q6 and IV albumin to help facilitate diuresis.  Low sodium diet.  Will follow sodium levels with diuresis.  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  Continue with ARB and IV lasix as above.  Will also give IV albumin prior to next furosemide dose.  Will need to arrange rituximab while she is an inpatient-  appreciate pharmacy help-  it is slated for tomorrow.  Syncope - likely orthostatic with use of diuretics and beta-blocker.  She had presyncope on 06/28/22 and was told to wait after standing and walk with assistance, however she got up without assistance.  Thankfully CT scan of head and cervical spine were unremarkable.  She would benefit from TED hose.  Continue to monitor neuro exam for changes. was not orthostatic by vital signs this AM Atrial fibrillation - rate controlled and on Eliquis. Chronic combines systolic and diastolic CHF - IV lasix as above.  Low sodium diet. Hypoalbuminemia - due to nephrotic syndrome. HTN - stable for now but likely has orthostatic hypotension.  Will need assistance when ambulating or going to bathroom.  Would keep in bed for  now. Interestingly not orthostatic by VS Nutrition - heart healthy diet.    Rita Myers    Labs: Basic Metabolic Panel: Recent Labs  Lab 07/26/22 2149 07/27/22 0301 07/27/22 1116  NA 120* 123* 126*  K 4.6 4.3  --   CL 88* 93*  --   CO2 21* 23  --   GLUCOSE 96 84  --   BUN 32* 30*  --   CREATININE 0.89 0.84  --   CALCIUM 8.6* 7.9*  --   PHOS  --  4.2  --    Liver Function Tests: Recent Labs  Lab 07/26/22 2149 07/27/22 0301  AST 28  --   ALT 18  --   ALKPHOS 44  --   BILITOT 0.8  --   PROT 5.2*  --   ALBUMIN 1.6* 1.7*   No results for input(s): "LIPASE", "AMYLASE" in the last 168 hours. No results for input(s): "AMMONIA" in the last 168 hours. CBC: Recent Labs  Lab 07/26/22 2149  WBC 8.8  NEUTROABS 6.5  HGB 12.3  HCT 36.3  MCV 91.2  PLT 198   Cardiac Enzymes: No results for input(s): "CKTOTAL", "CKMB", "CKMBINDEX", "TROPONINI" in the last 168 hours. CBG: No results for input(s): "GLUCAP" in the last 168 hours.  Iron Studies:  No results for input(s): "IRON", "TIBC", "TRANSFERRIN", "FERRITIN" in the last 72 hours. Studies/Results: DG Chest Portable 1 View  Result Date: 07/26/2022 CLINICAL DATA:  Syncope EXAM: PORTABLE CHEST 1 VIEW COMPARISON:  Chest x-ray 06/05/2022 FINDINGS: The heart size and mediastinal contours are within normal limits. Both lungs are clear. The visualized skeletal structures are unremarkable. IMPRESSION: No active disease. Electronically Signed   By: Ronney Asters M.D.   On: 07/26/2022 22:39   CT HEAD WO CONTRAST (5MM)  Result Date: 07/26/2022 CLINICAL DATA:  Level 2 fall on blood thinners with head and neck trauma EXAM: CT HEAD WITHOUT CONTRAST CT CERVICAL SPINE WITHOUT CONTRAST TECHNIQUE: Multidetector CT imaging of the head and cervical spine was performed following the standard protocol without intravenous contrast. Multiplanar CT image reconstructions of the cervical spine were also generated. RADIATION DOSE REDUCTION:  This exam was performed according to the departmental dose-optimization program which includes automated exposure control, adjustment of the mA and/or kV according to patient size and/or use of iterative reconstruction technique. COMPARISON:  CT and MRI of the head 06/27/2022 FINDINGS: CT HEAD FINDINGS Brain: No intracranial hemorrhage, mass effect, or evidence of acute infarct. No hydrocephalus. No extra-axial fluid collection. Generalized cerebral atrophy. Ill-defined hypoattenuation within the cerebral white matter is nonspecific but consistent with chronic small vessel ischemic disease. Vascular: No hyperdense vessel. Intracranial arterial calcification. Skull: No fracture or focal lesion. Sinuses/Orbits: No acute finding. Paranasal sinuses and mastoid air cells are well aerated. Other: None. CT CERVICAL SPINE FINDINGS Alignment: No evidence of traumatic malalignment. Skull base and vertebrae: No acute fracture. No primary bone lesion or focal pathologic process. Soft tissues and spinal canal: No prevertebral fluid or swelling. No visible canal hematoma. Disc levels: Multilevel spondylosis, degenerative endplate change, disc space height loss which is moderate-advanced from C3-C7. Posterior disc osteophyte complexes at C3-C7 cause mild effacement of the ventral thecal sac. No high-grade spinal canal narrowing. Uncovertebral spurring and facet arthropathy cause advanced neural foraminal narrowing on the left at C5-C6 and on the left at C6-C7. Upper chest: Negative. Other: None. IMPRESSION: 1. No acute intracranial abnormality. Generalized atrophy and small vessel white matter disease. 2. No acute fracture in the cervical spine. Multilevel degenerative spondylosis. Electronically Signed   By: Placido Sou M.D.   On: 07/26/2022 22:28   CT Cervical Spine Wo Contrast  Result Date: 07/26/2022 CLINICAL DATA:  Level 2 fall on blood thinners with head and neck trauma EXAM: CT HEAD WITHOUT CONTRAST CT CERVICAL  SPINE WITHOUT CONTRAST TECHNIQUE: Multidetector CT imaging of the head and cervical spine was performed following the standard protocol without intravenous contrast. Multiplanar CT image reconstructions of the cervical spine were also generated. RADIATION DOSE REDUCTION: This exam was performed according to the departmental dose-optimization program which includes automated exposure control, adjustment of the mA and/or kV according to patient size and/or use of iterative reconstruction technique. COMPARISON:  CT and MRI of the head 06/27/2022 FINDINGS: CT HEAD FINDINGS Brain: No intracranial hemorrhage, mass effect, or evidence of acute infarct. No hydrocephalus. No extra-axial fluid collection. Generalized cerebral atrophy. Ill-defined hypoattenuation within the cerebral white matter is nonspecific but consistent with chronic small vessel ischemic disease. Vascular: No hyperdense vessel. Intracranial arterial calcification. Skull: No fracture or focal lesion. Sinuses/Orbits: No acute finding. Paranasal sinuses and mastoid air cells are well aerated. Other: None. CT CERVICAL SPINE FINDINGS Alignment: No evidence of traumatic malalignment. Skull base and vertebrae: No acute fracture. No primary bone lesion or focal pathologic process. Soft tissues and spinal  canal: No prevertebral fluid or swelling. No visible canal hematoma. Disc levels: Multilevel spondylosis, degenerative endplate change, disc space height loss which is moderate-advanced from C3-C7. Posterior disc osteophyte complexes at C3-C7 cause mild effacement of the ventral thecal sac. No high-grade spinal canal narrowing. Uncovertebral spurring and facet arthropathy cause advanced neural foraminal narrowing on the left at C5-C6 and on the left at C6-C7. Upper chest: Negative. Other: None. IMPRESSION: 1. No acute intracranial abnormality. Generalized atrophy and small vessel white matter disease. 2. No acute fracture in the cervical spine. Multilevel  degenerative spondylosis. Electronically Signed   By: Placido Sou M.D.   On: 07/26/2022 22:28   Medications: Infusions:  [START ON 07/28/2022] famotidine (PEPCID) IV     [START ON 07/28/2022] riTUXimab (RITUXAN) 1,000 mg in sodium chloride 0.9 % 250 mL (4 mg/mL) infusion     [START ON 07/28/2022] sodium chloride      Scheduled Medications:  [START ON 07/28/2022] acetaminophen  650 mg Oral Once   apixaban  5 mg Oral BID   [START ON 07/28/2022] diphenhydrAMINE  50 mg Oral Once   furosemide  80 mg Intravenous Q6H   losartan  50 mg Oral Daily   metoprolol succinate  25 mg Oral Daily   rosuvastatin  10 mg Oral Daily    have reviewed scheduled and prn medications.  Physical Exam: General: sitting up eating lunch Heart: RRR Lungs: dec BS at bases Abdomen: non tender Extremities:  2+ pitting edema     07/27/2022,12:55 PM  LOS: 0 days

## 2022-07-27 NOTE — ED Notes (Signed)
ED TO INPATIENT HANDOFF REPORT  ED Nurse Name and Phone #: [phone]  S Name/Age/Gender Rita Myers 77 y.o. female Room/Bed: 003C/003C  Code Status   Code Status: Full Code  Home/SNF/Other Home Patient oriented to: self, place, time, and situation Is this baseline? Yes   Triage Complete: Triage complete  Chief Complaint Syncope [R55]  Triage Note Pt sent by nephrologist for low sodium levels (120) and increased leg swelling. 2lb weight gain since yesterday. Pt's family also reports loss of consciousness this evening. Endorses hitting head. On blood thinners.    Allergies No Known Allergies  Level of Care/Admitting Diagnosis ED Disposition     ED Disposition  Admit   Condition  --   Comment  Hospital Area: [facility] [[id number]]  Level of Care: Progressive [102]  Admit to Progressive based on following criteria: NEPHROLOGY stable condition requiring close monitoring for AKI, requiring Hemodialysis or Peritoneal Dialysis either from expected electrolyte imbalance, acidosis, or fluid overload that can be managed by NIPPV or high flow oxygen.  May admit patient to [facility] or [facility] if equivalent level of care is available:: Yes  Covid Evaluation: Asymptomatic - no recent exposure (last 10 days) testing not required  Diagnosis: Syncope [206001]  Admitting Physician: Bosie Helper  Attending Physician: Lucious Groves [id number]  Certification:: I certify this patient will need inpatient services for at least 2 midnights  Estimated Length of Stay: 3          B Medical/Surgery History Past Medical History:  Diagnosis Date   Aortic insufficiency    Essential hypertension 07/11/2017   Hyperlipemia    Hypertension    Hypokalemia 07/11/2017   Mitral regurgitation    Nausea, vomiting, and diarrhea 07/11/2017   Paroxysmal A-fib ([place]) 07/11/2017   Pulmonary hypertension ([place])    Syncope, vasovagal 07/11/2017   History reviewed. No pertinent  surgical history.   A IV Location/Drains/Wounds Patient Lines/Drains/Airways Status     Active Line/Drains/Airways     Name Placement date Placement time Site Days   Peripheral IV 07/26/22 20 G Right Antecubital 07/26/22  2157  Antecubital  1   Wound / Incision (Open or Dehisced) 05/22/22 Puncture Lumbar Lateral;Left Renal Biopsy site 05/22/22  0835  Lumbar  66            Intake/Output Last 24 hours  Intake/Output Summary (Last 24 hours) at 07/27/2022 1638 Last data filed at 07/27/2022 0544 Gross per 24 hour  Intake 39.81 ml  Output 900 ml  Net -860.19 ml    Labs/Imaging Results for orders placed or performed during the hospital encounter of 07/26/22 (from the past 48 hour(s))  CBC with Differential     Status: None   Collection Time: 07/26/22  9:49 PM  Result Value Ref Range   WBC 8.8 4.0 - 10.5 K/uL   RBC 3.98 3.87 - 5.11 MIL/uL   Hemoglobin 12.3 12.0 - 15.0 g/dL   HCT 36.3 36.0 - 46.0 %   MCV 91.2 80.0 - 100.0 fL   MCH 30.9 26.0 - 34.0 pg   MCHC 33.9 30.0 - 36.0 g/dL   RDW 12.3 11.5 - 15.5 %   Platelets 198 150 - 400 K/uL   nRBC 0.0 0.0 - 0.2 %   Neutrophils Relative % 74 %   Neutro Abs 6.5 1.7 - 7.7 K/uL   Lymphocytes Relative 14 %   Lymphs Abs 1.2 0.7 - 4.0 K/uL   Monocytes Relative 10 %   Monocytes  Absolute 0.9 0.1 - 1.0 K/uL   Eosinophils Relative 0 %   Eosinophils Absolute 0.0 0.0 - 0.5 K/uL   Basophils Relative 1 %   Basophils Absolute 0.0 0.0 - 0.1 K/uL   Immature Granulocytes 1 %   Abs Immature Granulocytes 0.07 0.00 - 0.07 K/uL    Comment: Performed at [facility], [place] [address], [place], [place] [address]  Comprehensive metabolic panel     Status: Abnormal   Collection Time: 07/26/22  9:49 PM  Result Value Ref Range   Sodium 120 (L) 135 - 145 mmol/L   Potassium 4.6 3.5 - 5.1 mmol/L   Chloride 88 (L) 98 - 111 mmol/L   CO2 21 (L) 22 - 32 mmol/L   Glucose, Bld 96 70 - 99 mg/dL    Comment: Glucose reference range applies only to samples  taken after fasting for at least 8 hours.   BUN 32 (H) 8 - 23 mg/dL   Creatinine, Ser 0.89 0.44 - 1.00 mg/dL   Calcium 8.6 (L) 8.9 - 10.3 mg/dL   Total Protein 5.2 (L) 6.5 - 8.1 g/dL   Albumin 1.6 (L) 3.5 - 5.0 g/dL   AST 28 15 - 41 U/L   ALT 18 0 - 44 U/L   Alkaline Phosphatase 44 38 - 126 U/L   Total Bilirubin 0.8 0.3 - 1.2 mg/dL   GFR, Estimated >60 >60 mL/min    Comment: (NOTE) Calculated using the CKD-EPI Creatinine Equation (2021)    Anion gap 11 5 - 15    Comment: Performed at [facility], [place] [address], [place], [place] [address]  Brain natriuretic peptide     Status: Abnormal   Collection Time: 07/26/22  9:49 PM  Result Value Ref Range   B Natriuretic Peptide 287.6 (H) 0.0 - 100.0 pg/mL    Comment: Performed at [facility] [address], [place], [place] [address]  Renal function panel     Status: Abnormal   Collection Time: 07/27/22  3:01 AM  Result Value Ref Range   Sodium 123 (L) 135 - 145 mmol/L   Potassium 4.3 3.5 - 5.1 mmol/L   Chloride 93 (L) 98 - 111 mmol/L   CO2 23 22 - 32 mmol/L   Glucose, Bld 84 70 - 99 mg/dL    Comment: Glucose reference range applies only to samples taken after fasting for at least 8 hours.   BUN 30 (H) 8 - 23 mg/dL   Creatinine, Ser 0.84 0.44 - 1.00 mg/dL   Calcium 7.9 (L) 8.9 - 10.3 mg/dL   Phosphorus 4.2 2.5 - 4.6 mg/dL   Albumin 1.7 (L) 3.5 - 5.0 g/dL   GFR, Estimated >60 >60 mL/min    Comment: (NOTE) Calculated using the CKD-EPI Creatinine Equation (2021)    Anion gap 7 5 - 15    Comment: Performed at [facility] [address] [place], [place] [address]  Osmolality     Status: Abnormal   Collection Time: 07/27/22  3:01 AM  Result Value Ref Range   Osmolality 273 (L) 275 - 295 mOsm/kg    Comment: Performed at [facility], [place] [address] [place], [place] [address]  Osmolality, urine     Status: Abnormal   Collection Time: 07/27/22  5:47 AM  Result Value Ref Range   Osmolality, Ur 257 (L) 300  - 900 mOsm/kg    Comment: Performed at [facility] [address],  [place], [place] [address]  Na and K (sodium & potassium), rand urine     Status: None   Collection Time: 07/27/22  5:47 AM  Result Value Ref Range   Sodium, Ur 27 mmol/L   Potassium Urine 37 mmol/L    Comment: Performed at [facility] [address], [place], [place] [address]  Sodium     Status: Abnormal   Collection Time: 07/27/22 11:16 AM  Result Value Ref Range   Sodium 126 (L) 135 - 145 mmol/L    Comment: Performed at [facility] [address] [place], [place] [address]   DG Chest Portable 1 View  Result Date: 07/26/2022 CLINICAL DATA:  Syncope EXAM: PORTABLE CHEST 1 VIEW COMPARISON:  Chest x-ray 06/05/2022 FINDINGS: The heart size and mediastinal contours are within normal limits. Both lungs are clear. The visualized skeletal structures are unremarkable. IMPRESSION: No active disease. Electronically Signed   By: Ronney Asters M.D.   On: 07/26/2022 22:39   CT HEAD WO CONTRAST (5MM)  Result Date: 07/26/2022 CLINICAL DATA:  Level 2 fall on blood thinners with head and neck trauma EXAM: CT HEAD WITHOUT CONTRAST CT CERVICAL SPINE WITHOUT CONTRAST TECHNIQUE: Multidetector CT imaging of the head and cervical spine was performed following the standard protocol without intravenous contrast. Multiplanar CT image reconstructions of the cervical spine were also generated. RADIATION DOSE REDUCTION: This exam was performed according to the departmental dose-optimization program which includes automated exposure control, adjustment of the mA and/or kV according to patient size and/or use of iterative reconstruction technique. COMPARISON:  CT and MRI of the head 06/27/2022 FINDINGS: CT HEAD FINDINGS Brain: No intracranial hemorrhage, mass effect, or evidence of acute infarct. No hydrocephalus. No extra-axial fluid collection. Generalized cerebral atrophy. Ill-defined hypoattenuation within the cerebral white  matter is nonspecific but consistent with chronic small vessel ischemic disease. Vascular: No hyperdense vessel. Intracranial arterial calcification. Skull: No fracture or focal lesion. Sinuses/Orbits: No acute finding. Paranasal sinuses and mastoid air cells are well aerated. Other: None. CT CERVICAL SPINE FINDINGS Alignment: No evidence of traumatic malalignment. Skull base and vertebrae: No acute fracture. No primary bone lesion or focal pathologic process. Soft tissues and spinal canal: No prevertebral fluid or swelling. No visible canal hematoma. Disc levels: Multilevel spondylosis, degenerative endplate change, disc space height loss which is moderate-advanced from C3-C7. Posterior disc osteophyte complexes at C3-C7 cause mild effacement of the ventral thecal sac. No high-grade spinal canal narrowing. Uncovertebral spurring and facet arthropathy cause advanced neural foraminal narrowing on the left at C5-C6 and on the left at C6-C7. Upper chest: Negative. Other: None. IMPRESSION: 1. No acute intracranial abnormality. Generalized atrophy and small vessel white matter disease. 2. No acute fracture in the cervical spine. Multilevel degenerative spondylosis. Electronically Signed   By: Placido Sou M.D.   On: 07/26/2022 22:28   CT Cervical Spine Wo Contrast  Result Date: 07/26/2022 CLINICAL DATA:  Level 2 fall on blood thinners with head and neck trauma EXAM: CT HEAD WITHOUT CONTRAST CT CERVICAL SPINE WITHOUT CONTRAST TECHNIQUE: Multidetector CT imaging of the head and cervical spine was performed following the standard protocol without intravenous contrast. Multiplanar CT image reconstructions of the cervical spine were also generated. RADIATION DOSE REDUCTION: This exam was performed according to the departmental dose-optimization program which includes automated exposure control, adjustment of the mA and/or kV according to patient size and/or use of iterative reconstruction technique. COMPARISON:  CT and  MRI of the head 06/27/2022 FINDINGS: CT HEAD FINDINGS  Brain: No intracranial hemorrhage, mass effect, or evidence of acute infarct. No hydrocephalus. No extra-axial fluid collection. Generalized cerebral atrophy. Ill-defined hypoattenuation within the cerebral white matter is nonspecific but consistent with chronic small vessel ischemic disease. Vascular: No hyperdense vessel. Intracranial arterial calcification. Skull: No fracture or focal lesion. Sinuses/Orbits: No acute finding. Paranasal sinuses and mastoid air cells are well aerated. Other: None. CT CERVICAL SPINE FINDINGS Alignment: No evidence of traumatic malalignment. Skull base and vertebrae: No acute fracture. No primary bone lesion or focal pathologic process. Soft tissues and spinal canal: No prevertebral fluid or swelling. No visible canal hematoma. Disc levels: Multilevel spondylosis, degenerative endplate change, disc space height loss which is moderate-advanced from C3-C7. Posterior disc osteophyte complexes at C3-C7 cause mild effacement of the ventral thecal sac. No high-grade spinal canal narrowing. Uncovertebral spurring and facet arthropathy cause advanced neural foraminal narrowing on the left at C5-C6 and on the left at C6-C7. Upper chest: Negative. Other: None. IMPRESSION: 1. No acute intracranial abnormality. Generalized atrophy and small vessel white matter disease. 2. No acute fracture in the cervical spine. Multilevel degenerative spondylosis. Electronically Signed   By: Placido Sou M.D.   On: 07/26/2022 22:28    Pending Labs Unresulted Labs (From admission, onward)     Start     Ordered   07/27/22 0500  Renal function panel  Daily,   R      07/26/22 2355   07/27/22 0320  Sodium  Now then every 4 hours,   R (with TIMED occurrences)      07/27/22 0319   07/26/22 2208  Urinalysis, Routine w reflex microscopic -Urine, Clean Catch  Once,   URGENT       Question:  Specimen Source  Answer:  Urine, Clean Catch   07/26/22 2207             Vitals/Pain Today's Vitals   07/27/22 1200 07/27/22 1230 07/27/22 1300 07/27/22 1345  BP: (!) 142/72 128/65 (!) 138/102 (!) 117/54  Pulse: 77 73 87 88  Resp: (!) '21 15 20 '$ (!) 22  Temp:      TempSrc:      SpO2: 99% 99% 100% 100%  Weight:      Height:        Isolation Precautions No active isolations  Medications Medications  furosemide (LASIX) injection 80 mg (0 mg Intravenous Hold 07/27/22 1148)  rosuvastatin (CRESTOR) tablet 10 mg (10 mg Oral Given 07/27/22 0939)  apixaban (ELIQUIS) tablet 5 mg (5 mg Oral Given 07/27/22 0939)  acetaminophen (TYLENOL) tablet 650 mg (has no administration in time range)    Or  acetaminophen (TYLENOL) suppository 650 mg (has no administration in time range)  senna-docusate (Senokot-S) tablet 1 tablet (has no administration in time range)  losartan (COZAAR) tablet 50 mg (50 mg Oral Given 07/27/22 0939)  metoprolol succinate (TOPROL-XL) 24 hr tablet 25 mg (25 mg Oral Given 07/27/22 0939)  acetaminophen (TYLENOL) tablet 650 mg (has no administration in time range)  diphenhydrAMINE (BENADRYL) capsule 50 mg (has no administration in time range)  sodium chloride 0.9 % bolus 1,000 mL (has no administration in time range)  diphenhydrAMINE (BENADRYL) injection 50 mg (has no administration in time range)  albuterol (PROVENTIL) (2.5 MG/3ML) 0.083% nebulizer solution 2.5 mg (has no administration in time range)  famotidine (PEPCID) IVPB 20 mg premix (has no administration in time range)  methylPREDNISolone sodium succinate (SOLU-MEDROL) 125 mg/2 mL injection 125 mg (has no administration in time range)  EPINEPHrine (ADRENALIN) 0.3 mg (  has no administration in time range)  riTUXimab (RITUXAN) 1,000 mg in sodium chloride 0.9 % 250 mL (4 mg/mL) infusion (has no administration in time range)  albumin human 25 % solution 12.5 g (0 g Intravenous Stopped 07/27/22 0248)    Mobility walks     Focused Assessments Cardiac Assessment Handoff:    Lab  Results  Component Value Date   TROPONINI 0.03 (HH) 07/11/2017   Lab Results  Component Value Date   DDIMER 1.09 (H) 06/05/2022   Does the Patient currently have chest pain? No   , Neuro Assessment Handoff:  Swallow screen pass? Yes          Neuro Assessment: Within Defined Limits Neuro Checks:      Has TPA been given? No If patient is a Neuro Trauma and patient is going to OR before floor call report to [facility] nurse: [phone] or [phone]   R Recommendations: See Admitting Provider Note  Report given to:   Additional Notes:

## 2022-07-27 NOTE — H&P (Addendum)
Date: 07/27/2022               Patient Name:  Rita Myers MRN: [id number]  DOB: [birth year] Age / Sex: 77 y.o., female   PCP: Romana Juniper, MD         Medical Service: Internal Medicine Teaching Service         Attending Physician: Dr. Lucious Groves, DO    First Contact: Dr. [name]  Pager: [phone]   Second Contact: Dr. Christiana Fuchs Pager: [phone]        After Hours (After 5p/  First Contact Pager: [phone]  weekends / holidays): Second Contact Pager: [phone]   Chief Complaint: syncope   History of Present Illness:  Rita Myers is a 77 y/o female with past medical history of HTN, HLD, HFrEF (EF 40-45% 05/2022), paroxysmal A-fib (on Eliquis), nephrotic syndrome, hypothyroidism, and prediabetes that presents after a syncopal episode.   Patient was instructed to visit the ED today by her nephrologist due to recent labs demonstrating hyponatremia. Patient was then in the process of standing up after getting out of bed when she developed lightheadedness followed by syncope.  Her fall was unwitnessed but after the patient arose she was able to call her husband into the room.  Her husband found her lying on the floor in the process of getting back up. She denies chest pain or palpitations currently or prior to falling. She is unsure of if she hit her head but denies headache at this time. She denies having pain anywhere at this time.   Of note, patient reports that she was instructed by her nephrologist to increase her torsemide dose to 60 mg daily due to worsening lower extremity swelling over the last few days.  She denies increase of her daily fluid intake (drinks 6 cups of water daily, avoids sodas/sugary drinks, limits sodium intake).  She was seen in the ED approximately 1 month ago for a similar syncopal episode.  She was not admitted at this time.  Patient denies fever, chills, cough, sore throat, nausea, vomiting, diarrhea, abdominal pain, dysuria, urinary  frequency, SOB, orthopnea.   ED Course:  Labs: -CMP significant for hyponatremia at 120, decreased chloride at 88, BUN at 32, decreased albumin at 1.6 -CBC unremarkable -BNP elevated at 287 -Pending UA  Imaging: -CT head/c-spine negative for acute abnormality or fracture, generalized atrophy and small vessel white matter disease, multilevel degenerative spondylosis -CXR negative for acute abnormalities   EKG significant for atrial fibrillation with normal rate.   Interventions: Trauma and nephrology were consulted. Patient was given IV lasix 80 mg and IV albumin 12.5 mg   Review of Systems: A complete ROS was negative except as per HPI.    Meds:  -Losartan 50 mg daily -Metoprolol succinate 25 mg daily -Torsemide 20 mg (40 mg daily), started 60 mg daily today -Potassium 20 mEq (40 mEq daily) -Levothyroxine 25 mcg daily -Eliquis 5 mg BID -Loratadine 10 mg daily -Rosuvastatin 10 mg  daily Current Meds  Medication Sig   apixaban (ELIQUIS) 5 MG TABS tablet Take 1 tablet (5 mg total) by mouth 2 (two) times daily.   loratadine (CLARITIN) 10 MG tablet Take 10 mg by mouth daily.   losartan (COZAAR) 100 MG tablet Take 0.5 tablets (50 mg total) by mouth daily. (Patient taking differently: Take 100 mg by mouth daily.)   metoprolol succinate (TOPROL-XL) 25 MG 24 hr tablet Take 1 tablet (25 mg total) by mouth daily.   Na  Sulfate-K Sulfate-Mg Sulf 17.5-3.13-1.6 GM/177ML SOLN As directed by your pharmacy   potassium chloride SA (KLOR-CON M) 20 MEQ tablet Take 1 tablet (20 mEq total) by mouth daily.   rosuvastatin (CRESTOR) 10 MG tablet Take 1 tablet (10 mg total) by mouth daily.   torsemide (DEMADEX) 20 MG tablet Take 1 tablet (20 mg total) by mouth daily for 365 doses.     Allergies: Allergies as of 07/26/2022   (No Known Allergies)   Past Medical History:  Diagnosis Date   Aortic insufficiency    Essential hypertension 07/11/2017   Hyperlipemia    Hypertension    Hypokalemia  07/11/2017   Mitral regurgitation    Nausea, vomiting, and diarrhea 07/11/2017   Paroxysmal A-fib ([place]) 07/11/2017   Pulmonary hypertension ([place])    Syncope, vasovagal 07/11/2017    Surgical History: Patient denies history of prior surgeries.   Family History: T2DM (mother, brother). Patient denies family history of CHF, MI, DVT, stroke, or cancer.   Social History: Currently lives with at home with husband. 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 Typically independent of all ADLs/IADLs until falls recently (husband drives, gets groceries, cooks, and ensures her safety when she is walking). Ambulates w/ a walker at baseline. Denies current or prior alcohol, tobacco, or recreational drug use.   Physical Exam: Blood pressure 133/69, pulse 73, temperature 98.3 F (36.8 C), temperature source Oral, resp. rate 16, height '4\' 8"'$  (1.422 m), weight 65.3 kg, SpO2 100 %. Constitutional: Well-developed, well-nourished, appears comfortable  HENT: Normocephalic and atraumatic. No bruising or bleeding. Dry mucous membranes. Eyes: EOMI. PERRL.  Neck: Normal range of motion.  Cardiovascular: Regular rate, irregularly irregular rhythm. No murmurs, rubs, or gallops. Normal radial and PT pulses bilaterally. 3+ LE edema bilaterally. No JVD.  Pulmonary: Normal respiratory effort. No wheezes, rales, rhonchi, or crackles.   Abdominal: Soft. Non-distended. No tenderness. Normal bowel sounds.  Musculoskeletal: Normal range of motion. Moving all extremities spontaneously.     Neurological: Alert and oriented to person, place, and time. Non-focal.  Skin: warm and dry. Normal skin turgor.   EKG: personally reviewed my interpretation is atrial fibrillation with normal rate  CXR: personally reviewed my interpretation is negative for acute abnormalities  Assessment & Plan by Problem: Principal Problem:   Syncope   Rita Myers is a 77 y/o female with past medical history of HTN, HLD, HFrEF (EF 40-45% 05/2022), paroxysmal  A-fib (on Eliquis), nephrotic syndrome, hypothyroidism, and prediabetes  that presents for syncope and was admitted for syncope, noted to have significant hyponatremia.    #Syncope Patient was seen in the ED approximately 1 month ago for syncopal episode. Was suspected to be secondary to volume depletion and patient was discharged.  She presents after another syncopal episode occurred upon standing. She reports lightheadedness prior to her syncopal event. She remains hemodynamically stable. No signs of trauma on CT head/C-spine or CXR.  Home torsemide dose was increased by her nephrologist from 40 mg daily to 60 mg daily today for her nephrotic syndrome.  Patient also taking losartan 50 mg daily and metoprolol 25 mg daily. Suspect that her episodes of syncope are likely secondary to orthostatic hypotension related to home diuresis. Will obtain orthostatic vital signs to assess for orthostatic hypotension. Patient does have a history of paroxysmal A-fib. It is possible that arrhythmia was the cause of her syncope, however patient did not have chest pain or palpitations prior to syncope. Patient did have 30-day event monitor placed by cardiology last month. Will have her  monitor interrogated to assess for signs of arrhythmia.   -Continue Metoprolol succinate 25 mg daily -Continue Losartan 50 mg daily -Pending event monitor interrogation -Orthostatic VS -PT/OT eval  #Hyponatremia Patient endorses strict low-sodium diet as outpatient for nephrotic syndrome. CMP significant for sodium of 120 on admission.  Nephrology suspects hypervolemic hyponatremia in the setting of nephrotic syndrome with plan to diurese.  Goal is to increase sodium by 6-8 within a  24 hour period to avoid rapid correction of hyponatremia. Will obtain additional studies to further characterize her hyponatremia. -Trend sodium q4 hours  -Pending urine osmolality/sodium -Pending serum osmolality -Diuresis as stated below  #Nephrotic  Syndrome Patient follows with Dr. Joelyn Oms at [facility].  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 She is currently being treated with rituximab with neck scheduled dose on 3/15. CMP significant for low albumin at 1.6.  Has significant lower extremity swelling on exam. Nephrology was consulted by ED provider suspects hypervolemic hyponatremia likely secondary to nephrotic syndrome. Failed outpatient diuresis with torsemide. Plan is for continued diuresis while inpatient.  -Appreciate nephrology recommendations -Continue rituximab while inpatient -IV lasix 80 mg q6  #HFrEF (EF 40-45% 05/2022)  #HTN Echo from 05/2022 w/ EF 40-45%, LV global hypokinesis, and G2DD. Currently taking losartan 50 mg daily and metoprolol 25 mg daily.  Patient has been taking torsemide 40 mg daily but was instructed by her nephrologist to increase to 60 mg daily today due to lower extremity swelling.  On exam, she does have significant lower extremity swelling but is without JVD or crackles of the lungs.  Low suspicion for acute CHF exacerbation at this time.  Suspect her hypervolemia is more likely secondary to nephrotic syndrome. -Continue Losartan 50 mg daily -Continue Metoprolol succinate 25 mg daily -Diuresis as stated above  #Paroxysmal atrial fibrillation (on Eliquis) Currently taking Metoprolol succinate 25 mg daily and Eliquis 5 mg twice daily at home.  EKG on admission significant for atrial fibrillation with normal rate. -Continue Metoprolol succinate 25 mg daily -Continue Eliquis 5 mg BID  #Hypothyroidism TSH from 2 months ago was elevated at 5.41 with normal free T4.  Currently taking levothyroxine 25 mcg daily at home.  Would repeat TSH/free T4 in the outpatient setting once patient's acute illness has resolved as TSH/free T4 may be skewed in the setting of acute illness. -Continue Levothyroxine 25 mcg daily  #HLD Lipid panel from 2 months ago with total cholesterol elevated  at 277, elevated TG at 166, and elevated LDL at 167. Currently taking rosuvastatin 10 mg daily at home. Unable to calculate ASCVD risk given age > 19, however, suspect patient could still benefit from cholesterol lowering medication. -Continue Rosuvastatin 10 mg  daily  #Prediabetes A1c was 6.1 two months ago.  Not currently on medications for this condition at home.  Could consider starting metformin in the outpatient setting. -Trend CBGs   Diet: HH (fluid restriction < 2L) Bowel: senna VTE: Eliquis IVF: none Code: Full PT/OT recs: pending   Prior to Admission Living Arrangement: home w/ husband Anticipated Discharge Location: TBD Barriers to Discharge: continued management  Dispo: Admit patient to inpatient with expected length of stay greater than 2 days  Signed: Starlyn Skeans, MD 07/27/2022, 2:22 AM  Pager: [phone] After 5pm on weekdays and 1pm on weekends: On Call pager: [phone]

## 2022-07-28 DIAGNOSIS — N049 Nephrotic syndrome with unspecified morphologic changes: Secondary | ICD-10-CM | POA: Diagnosis not present

## 2022-07-28 DIAGNOSIS — R55 Syncope and collapse: Secondary | ICD-10-CM | POA: Diagnosis not present

## 2022-07-28 DIAGNOSIS — E871 Hypo-osmolality and hyponatremia: Secondary | ICD-10-CM | POA: Diagnosis not present

## 2022-07-28 DIAGNOSIS — I5023 Acute on chronic systolic (congestive) heart failure: Secondary | ICD-10-CM | POA: Diagnosis not present

## 2022-07-28 LAB — SODIUM
Sodium: 124 mmol/L — ABNORMAL LOW (ref 135–145)
Sodium: 125 mmol/L — ABNORMAL LOW (ref 135–145)
Sodium: 123 mmol/L — ABNORMAL LOW (ref 135–145)
Sodium: 126 mmol/L — ABNORMAL LOW (ref 135–145)
Sodium: 127 mmol/L — ABNORMAL LOW (ref 135–145)
Sodium: 130 mmol/L — ABNORMAL LOW (ref 135–145)

## 2022-07-28 LAB — RENAL FUNCTION PANEL
Albumin: <1.5 g/dL — ABNORMAL LOW (ref 3.5–5.0)
Albumin: 2 g/dL — ABNORMAL LOW (ref 3.5–5.0)
Anion gap: 5 (ref 5–15)
Anion gap: 6 (ref 5–15)
BUN: 36 mg/dL — ABNORMAL HIGH (ref 8–23)
BUN: 39 mg/dL — ABNORMAL HIGH (ref 8–23)
CO2: 23 mmol/L (ref 22–32)
CO2: 26 mmol/L (ref 22–32)
Calcium: 7.5 mg/dL — ABNORMAL LOW (ref 8.9–10.3)
Calcium: 8.2 mg/dL — ABNORMAL LOW (ref 8.9–10.3)
Chloride: 94 mmol/L — ABNORMAL LOW (ref 98–111)
Chloride: 98 mmol/L (ref 98–111)
Creatinine, Ser: 1.12 mg/dL — ABNORMAL HIGH (ref 0.44–1.00)
Creatinine, Ser: 0.91 mg/dL (ref 0.44–1.00)
GFR, Estimated: 50 mL/min — ABNORMAL LOW (ref >60)
GFR, Estimated: >60 mL/min (ref >60)
Glucose, Bld: 111 mg/dL — ABNORMAL HIGH (ref 70–99)
Glucose, Bld: 110 mg/dL — ABNORMAL HIGH (ref 70–99)
Phosphorus: 4.2 mg/dL (ref 2.5–4.6)
Phosphorus: 4.2 mg/dL (ref 2.5–4.6)
Potassium: 3.7 mmol/L (ref 3.5–5.1)
Potassium: 4.4 mmol/L (ref 3.5–5.1)
Sodium: 122 mmol/L — ABNORMAL LOW (ref 135–145)
Sodium: 130 mmol/L — ABNORMAL LOW (ref 135–145)

## 2022-07-28 LAB — MAGNESIUM: Magnesium: 2.4 mg/dL (ref 1.7–2.4)

## 2022-07-28 MED ORDER — FUROSEMIDE 10 MG/ML IJ SOLN
80.0000 mg | Freq: Two times a day (BID) | INTRAMUSCULAR | Status: DC
  Administered 2022-07-28 – 2022-07-29 (×3): 80 mg via INTRAVENOUS
  Filled 2022-07-28 (×3): qty 8

## 2022-07-28 MED ORDER — FUROSEMIDE 10 MG/ML IJ SOLN: 60.0000 mg | Freq: Once | INTRAMUSCULAR | Status: DC

## 2022-07-28 MED ORDER — POTASSIUM CHLORIDE CRYS ER 20 MEQ PO TBCR
40.0000 meq | EXTENDED_RELEASE_TABLET | Freq: Once | ORAL | Status: AC
  Administered 2022-07-28: 40 meq via ORAL
  Filled 2022-07-28: qty 2

## 2022-07-28 MED ORDER — ALBUMIN HUMAN 25 % IV SOLN
25.0000 g | Freq: Once | INTRAVENOUS | Status: AC
  Administered 2022-07-28: 25 g via INTRAVENOUS
  Filled 2022-07-28: qty 100

## 2022-07-28 MED ORDER — POLYETHYLENE GLYCOL 3350 17 G PO PACK
17.0000 g | PACK | Freq: Every day | ORAL | Status: DC
  Administered 2022-07-28 – 2022-07-30 (×3): 17 g via ORAL
  Filled 2022-07-28 (×3): qty 1

## 2022-07-28 NOTE — Progress Notes (Addendum)
Subjective:   Doing well today, says she feels lighter. Concerned about miralax use w/ renal disease. Has not had BM.  Objective Vital signs in last 24 hours: Vitals:   07/27/22 2026 07/28/22 0011 07/28/22 0526 07/28/22 0758  BP: 103/76 128/64 133/66 138/74  Pulse: 78 79 77 77  Resp: 20 18 20 18   Temp: 98.2 F (36.8 C) 98 F (36.7 C) 98.2 F (36.8 C) 98 F (36.7 C)  TempSrc: Oral Oral Oral Oral  SpO2: 100% 100% 99% 100%  Weight:   64.6 kg   Height:       Weight change: -0.726 kg  Intake/Output Summary (Last 24 hours) at 07/28/2022 0859 Last data filed at 07/27/2022 2350 Gross per 24 hour  Intake 769.79 ml  Output --  Net 769.79 ml     Assessment/Plan:  Hypervolemic hyponatremia - due to nephrotic syndrome.  Asymptomatic.  Failed outpatient oral diuresis.  started IV lasix 80 mg q6 and IV albumin to help facilitate diuresis.  Low sodium diet.  Na improved to 125 today from 120 on admission, UOP not recorded, lasix given x 2 yesterday. Will continue IV diuresis q12h, w/ low salt diet and fluid restriction. Will follow sodium levels with diuresis, plan to increases Na by <8 in 24 hr. Lasix 80 mg BID Albumin x 1, 25 g Na level q4h RFP daily    Strict I/O Daily wt AKI - Cr rise from 0.86 to 1.12 overnight. Likely in setting of lasix use. 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  Continue with ARB and IV lasix as above.   Rituxan today Continue lasix and ARB Syncope - likely orthostatic with use of diuretics and beta-blocker.  She had presyncope on 06/28/22 and was told to wait after standing and walk with assistance, however she got up without assistance.  Thankfully CT scan of head and cervical spine were unremarkable.  She would benefit from TED hose.  Continue to monitor neuro exam for changes. Was not orthostatic by vital signs yesterday Atrial  fibrillation - rate controlled and on Eliquis. Chronic combined systolic and diastolic CHF - IV lasix as above.  Low sodium diet. Hypoalbuminemia - due to nephrotic syndrome. HTN - stable for now but likely has orthostatic hypotension.  Will need assistance when ambulating or going to bathroom.  Would keep in bed for now. Interestingly not orthostatic by VS Nutrition - heart healthy diet.    Holley Bouche    Labs: Basic Metabolic Panel: Recent Labs  Lab 07/26/22 2149 07/27/22 0301 07/27/22 1116 07/27/22 2317 07/28/22 0256 07/28/22 0716  NA 120* 123*   < > 124* 122* 125*  K 4.6 4.3  --   --  3.7  --   CL 88* 93*  --   --  94*  --   CO2 21* 23  --   --  23  --   GLUCOSE 96 84  --   --  111*  --   BUN 32* 30*  --   --  36*  --   CREATININE 0.89 0.84  --   --  1.12*  --   CALCIUM 8.6* 7.9*  --   --  7.5*  --   PHOS  --  4.2  --   --  4.2  --    < > = values in this interval not displayed.  Liver Function Tests: Recent Labs  Lab 07/26/22 2149 07/27/22 0301 07/28/22 0256  AST 28  --   --   ALT 18  --   --   ALKPHOS 44  --   --   BILITOT 0.8  --   --   PROT 5.2*  --   --   ALBUMIN 1.6* 1.7* <1.5*    No results for input(s): "LIPASE", "AMYLASE" in the last 168 hours. No results for input(s): "AMMONIA" in the last 168 hours. CBC: Recent Labs  Lab 07/26/22 2149  WBC 8.8  NEUTROABS 6.5  HGB 12.3  HCT 36.3  MCV 91.2  PLT 198    Cardiac Enzymes: No results for input(s): "CKTOTAL", "CKMB", "CKMBINDEX", "TROPONINI" in the last 168 hours. CBG: No results for input(s): "GLUCAP" in the last 168 hours.  Iron Studies: No results for input(s): "IRON", "TIBC", "TRANSFERRIN", "FERRITIN" in the last 72 hours. Studies/Results: DG Chest Portable 1 View  Result Date: 07/26/2022 CLINICAL DATA:  Syncope EXAM: PORTABLE CHEST 1 VIEW COMPARISON:  Chest x-ray 06/05/2022 FINDINGS: The heart size and mediastinal contours are within normal limits. Both lungs are clear. The  visualized skeletal structures are unremarkable. IMPRESSION: No active disease. Electronically Signed   By: Ronney Asters M.D.   On: 07/26/2022 22:39   CT HEAD WO CONTRAST (5MM)  Result Date: 07/26/2022 CLINICAL DATA:  Level 2 fall on blood thinners with head and neck trauma EXAM: CT HEAD WITHOUT CONTRAST CT CERVICAL SPINE WITHOUT CONTRAST TECHNIQUE: Multidetector CT imaging of the head and cervical spine was performed following the standard protocol without intravenous contrast. Multiplanar CT image reconstructions of the cervical spine were also generated. RADIATION DOSE REDUCTION: This exam was performed according to the departmental dose-optimization program which includes automated exposure control, adjustment of the mA and/or kV according to patient size and/or use of iterative reconstruction technique. COMPARISON:  CT and MRI of the head 06/27/2022 FINDINGS: CT HEAD FINDINGS Brain: No intracranial hemorrhage, mass effect, or evidence of acute infarct. No hydrocephalus. No extra-axial fluid collection. Generalized cerebral atrophy. Ill-defined hypoattenuation within the cerebral white matter is nonspecific but consistent with chronic small vessel ischemic disease. Vascular: No hyperdense vessel. Intracranial arterial calcification. Skull: No fracture or focal lesion. Sinuses/Orbits: No acute finding. Paranasal sinuses and mastoid air cells are well aerated. Other: None. CT CERVICAL SPINE FINDINGS Alignment: No evidence of traumatic malalignment. Skull base and vertebrae: No acute fracture. No primary bone lesion or focal pathologic process. Soft tissues and spinal canal: No prevertebral fluid or swelling. No visible canal hematoma. Disc levels: Multilevel spondylosis, degenerative endplate change, disc space height loss which is moderate-advanced from C3-C7. Posterior disc osteophyte complexes at C3-C7 cause mild effacement of the ventral thecal sac. No high-grade spinal canal narrowing. Uncovertebral  spurring and facet arthropathy cause advanced neural foraminal narrowing on the left at C5-C6 and on the left at C6-C7. Upper chest: Negative. Other: None. IMPRESSION: 1. No acute intracranial abnormality. Generalized atrophy and small vessel white matter disease. 2. No acute fracture in the cervical spine. Multilevel degenerative spondylosis. Electronically Signed   By: Placido Sou M.D.   On: 07/26/2022 22:28   CT Cervical Spine Wo Contrast  Result Date: 07/26/2022 CLINICAL DATA:  Level 2 fall on blood thinners with head and neck trauma EXAM: CT HEAD WITHOUT CONTRAST CT CERVICAL SPINE WITHOUT CONTRAST TECHNIQUE: Multidetector CT imaging of the head and cervical spine was performed following the standard protocol without intravenous contrast. Multiplanar CT image reconstructions of the cervical spine  were also generated. RADIATION DOSE REDUCTION: This exam was performed according to the departmental dose-optimization program which includes automated exposure control, adjustment of the mA and/or kV according to patient size and/or use of iterative reconstruction technique. COMPARISON:  CT and MRI of the head 06/27/2022 FINDINGS: CT HEAD FINDINGS Brain: No intracranial hemorrhage, mass effect, or evidence of acute infarct. No hydrocephalus. No extra-axial fluid collection. Generalized cerebral atrophy. Ill-defined hypoattenuation within the cerebral white matter is nonspecific but consistent with chronic small vessel ischemic disease. Vascular: No hyperdense vessel. Intracranial arterial calcification. Skull: No fracture or focal lesion. Sinuses/Orbits: No acute finding. Paranasal sinuses and mastoid air cells are well aerated. Other: None. CT CERVICAL SPINE FINDINGS Alignment: No evidence of traumatic malalignment. Skull base and vertebrae: No acute fracture. No primary bone lesion or focal pathologic process. Soft tissues and spinal canal: No prevertebral fluid or swelling. No visible canal hematoma. Disc  levels: Multilevel spondylosis, degenerative endplate change, disc space height loss which is moderate-advanced from C3-C7. Posterior disc osteophyte complexes at C3-C7 cause mild effacement of the ventral thecal sac. No high-grade spinal canal narrowing. Uncovertebral spurring and facet arthropathy cause advanced neural foraminal narrowing on the left at C5-C6 and on the left at C6-C7. Upper chest: Negative. Other: None. IMPRESSION: 1. No acute intracranial abnormality. Generalized atrophy and small vessel white matter disease. 2. No acute fracture in the cervical spine. Multilevel degenerative spondylosis. Electronically Signed   By: Placido Sou M.D.   On: 07/26/2022 22:28   Medications: Infusions:  famotidine (PEPCID) IV     riTUXimab (RITUXAN) 1,000 mg in sodium chloride 0.9 % 250 mL (4 mg/mL) infusion     sodium chloride      Scheduled Medications:  acetaminophen  650 mg Oral Once   apixaban  5 mg Oral BID   diphenhydrAMINE  50 mg Oral Once   losartan  50 mg Oral Daily   metoprolol succinate  25 mg Oral Daily   rosuvastatin  10 mg Oral Daily    have reviewed scheduled and prn medications.  Physical Exam: General: NAD, sitting up eating, comfortable, conversational, husband at bedside Heart: Irregularly irregular, NRMG Lungs: dec BS at bases, no rales or wheezing Abdomen: non tender, non-distended Extremities:  2+ pitting edema to level of thighs    07/28/2022,8:59 AM  LOS: 1 day      I have seen and examined this patient and agree with plan and assessment in the above note with renal recommendations/intervention highlighted. Unfortunately, no accurate I's/O's documented.  Would recommend purewik to help with collecting and measuring UOP.  Overall, she feels better and that she is "lighter".  Slight bump in Scr.  Na improving, however changed from tid to bid for unclear reasons.  Continue to follow.  Governor Rooks Domenique Southers,MD 07/28/2022 12:38 PM

## 2022-07-28 NOTE — Evaluation (Addendum)
Physical Therapy Evaluation Patient Details Name: Rita Myers MRN: [id number] DOB: 12/02/1945 Today's Date: 07/28/2022  History of Present Illness  Pt is 77 year old presented to [facility] on 3/14 after a syncopal episode. Past medical history of HTN, HLD, HFrEF (EF 40-45% 05/2022), paroxysmal A-fib (on Eliquis), nephrotic syndrome, hypothyroidism, and prediabetes  Clinical Impression  Pt presents with admitting diagnosis above. Pt was able to ambulate in hallway 677ft with rollator today at supervision level. Pt presents at or near baseline mobility. Pt has no further acute PT needs and will be signing off. Pt would benefit from continued mobility with mobility specialist during acute stay. Re consult PT if mobility status changes.         Recommendations for follow up therapy are one component of a multi-disciplinary discharge planning process, led by the attending physician.  Recommendations may be updated based on patient status, additional functional criteria and insurance authorization.  Follow Up Recommendations No PT follow up      Assistance Recommended at Discharge PRN  Patient can return home with the following  A little help with walking and/or transfers;A little help with bathing/dressing/bathroom;Assistance with cooking/housework;Assist for transportation;Help with stairs or ramp for entrance    Equipment Recommendations None recommended by PT (Pt has needed DME)  Recommendations for Other Services       Functional Status Assessment Patient has had a recent decline in their functional status and demonstrates the ability to make significant improvements in function in a reasonable and predictable amount of time.     Precautions / Restrictions Precautions Precautions: Fall Precaution Comments: Heart monitor Restrictions Weight Bearing Restrictions: No      Mobility  Bed Mobility Overal bed mobility: Modified Independent Bed Mobility: Supine to Sit, Sit to Supine      Supine to sit: Modified independent (Device/Increase time), HOB elevated Sit to supine: Modified independent (Device/Increase time), HOB elevated        Transfers Overall transfer level: Needs assistance Equipment used: Rollator (4 wheels) Transfers: Sit to/from Stand Sit to Stand: Min guard           General transfer comment: Cues for locking brakes    Ambulation/Gait Ambulation/Gait assistance: Supervision Gait Distance (Feet): 600 Feet Assistive device: Rollator (4 wheels) Gait Pattern/deviations: WFL(Within Functional Limits) Gait velocity: decreased     General Gait Details: no LOB noted  Stairs            Wheelchair Mobility    Modified Rankin (Stroke Patients Only)       Balance Overall balance assessment: Mild deficits observed, not formally tested                                           Pertinent Vitals/Pain Pain Assessment Pain Assessment: No/denies pain    Home Living Family/patient expects to be discharged to:: Private residence Living Arrangements: Spouse/significant other;Children Available Help at Discharge: Family;Available 24 hours/day Type of Home: Apartment Home Access: Level entry       Home Layout: One level Home Equipment: Rollator (4 wheels);BSC/3in1;Cane - single point;Shower seat      Prior Function Prior Level of Function : Independent/Modified Independent       Physical Assist : ADLs (physical)   ADLs (physical): Bathing;Dressing Mobility Comments: Uses rollator, does not drive ADLs Comments: Pt reports daugher and husband have been assisting with bADLs PTA due to increased edema since January.  Prior to edema buildup Pt was independent in bADLs     Hand Dominance   Dominant Hand: Right    Extremity/Trunk Assessment   Upper Extremity Assessment Upper Extremity Assessment: Overall WFL for tasks assessed    Lower Extremity Assessment Lower Extremity Assessment: Overall WFL for tasks  assessed    Cervical / Trunk Assessment Cervical / Trunk Assessment: Normal  Communication   Communication: No difficulties  Cognition Arousal/Alertness: Awake/alert Behavior During Therapy: WFL for tasks assessed/performed Overall Cognitive Status: Within Functional Limits for tasks assessed                                 General Comments: A&Ox4        General Comments General comments (skin integrity, edema, etc.): VSS on RA. Per OT note pt has history of falls where she blacked out    Exercises     Assessment/Plan    PT Assessment Patient does not need any further PT services  PT Problem List         PT Treatment Interventions      PT Goals (Current goals can be found in the Care Plan section)       Frequency       Co-evaluation               AM-PAC PT "6 Clicks" Mobility  Outcome Measure Help needed turning from your back to your side while in a flat bed without using bedrails?: None Help needed moving from lying on your back to sitting on the side of a flat bed without using bedrails?: None Help needed moving to and from a bed to a chair (including a wheelchair)?: None Help needed standing up from a chair using your arms (e.g., wheelchair or bedside chair)?: None Help needed to walk in hospital room?: None Help needed climbing 3-5 steps with a railing? : A Little 6 Click Score: 23    End of Session Equipment Utilized During Treatment: Gait belt Activity Tolerance: Patient tolerated treatment well Patient left: in bed;with call bell/phone within reach;with family/visitor present Nurse Communication: Mobility status PT Visit Diagnosis: Other abnormalities of gait and mobility (R26.89)    [id number] PT Time Calculation (min) (ACUTE ONLY): 16 min   Charges:   PT Evaluation $PT Eval Low Complexity: 1 Low PT Treatments $Gait Training: 8-22 mins        Shelby Mattocks, PT, DPT Acute Rehab Services [id number]   Viann Shove 07/28/2022, 12:38 PM

## 2022-07-28 NOTE — Evaluation (Signed)
Occupational Therapy Evaluation Patient Details Name: Rita Myers MRN: [id number] DOB: 21-Sep-1945 Today's Date: 07/28/2022   History of Present Illness Pt is 77 year old presented to [facility] [place] on  05/18/22 with progressive lower extremity edema, SOB, weight gain. PMH of acute on chronic systolic heart failure. paroxysmal afib, HTN, neprhotic syndrome, and hyperlipidema. Pt reports increased edema since Janury of 2024. Pt reports 2 falls in the last year.   Clinical Impression   Pt admitted for above diagnosis. Pt lives with spouse and daughter, prior to her edema onset in January she was indepepndent in bADLs and IADLs but was not driving. After her onset of edema she's been needing assistance with bathing and dressing. Pt lives in an apartment with one floor, no stairs to enter, has a walkin shower with a built-in seat, uses Rollator. Pt currently ambulating in the hallway with Min guard assist + Rollator, completing bed mobility with supervision. Pt would benefit from continued skilled OT services to address above deficits and help transition to next level of care. Pt would benefit from home OT services following post acute services with 24/7 supervision and support from family.      Recommendations for follow up therapy are one component of a multi-disciplinary discharge planning process, led by the attending physician.  Recommendations may be updated based on patient status, additional functional criteria and insurance authorization.   Follow Up Recommendations  Home health OT     Assistance Recommended at Discharge Frequent or constant Supervision/Assistance  Patient can return home with the following A little help with walking and/or transfers;A lot of help with bathing/dressing/bathroom;Assistance with cooking/housework;Help with stairs or ramp for entrance;Assist for transportation    Functional Status Assessment  Patient has had a recent decline in their functional status and demonstrates  the ability to make significant improvements in function in a reasonable and predictable amount of time.  Equipment Recommendations  Other (comment) (Pt would benefit from handheld shower hose)    Recommendations for Other Services       Precautions / Restrictions Precautions Precautions: Fall Precaution Comments: Heart monitor Restrictions Weight Bearing Restrictions: No      Mobility Bed Mobility Overal bed mobility: Needs Assistance Bed Mobility: Supine to Sit, Sit to Supine     Supine to sit: Supervision, HOB elevated Sit to supine: Supervision, HOB elevated        Transfers Overall transfer level: Needs assistance Equipment used: Rollator (4 wheels) Transfers: Sit to/from Stand Sit to Stand: Min guard                  Balance Overall balance assessment: Needs assistance Sitting-balance support: Feet supported, No upper extremity supported Sitting balance-Leahy Scale: Normal     Standing balance support: Reliant on assistive device for balance, Bilateral upper extremity supported, During functional activity Standing balance-Leahy Scale: Poor Standing balance comment: Pt reports she needs Rollator for balance                           ADL either performed or assessed with clinical judgement   ADL Overall ADL's : Needs assistance/impaired Eating/Feeding: Independent   Grooming: Min guard;Standing   Upper Body Bathing: Minimal assistance;Sitting   Lower Body Bathing: Moderate assistance;Sitting/lateral leans   Upper Body Dressing : Minimal assistance;Sitting   Lower Body Dressing: Maximal assistance;Sitting/lateral leans   Toilet Transfer: Min guard;Rollator (4 wheels)   Toileting- Clothing Manipulation and Hygiene: Minimal assistance;Sit to/from stand   Tub/ Banker:  Min guard;Rollator (4 wheels)   Functional mobility during ADLs: Engineer, manufacturing (4 wheels) General ADL Comments: Pt completed hall level ambulation with  Min guard + Rollator     Vision Baseline Vision/History: 1 Wears glasses Patient Visual Report: No change from baseline Vision Assessment?: No apparent visual deficits     Perception     Praxis      Pertinent Vitals/Pain Pain Assessment Pain Assessment: No/denies pain     Hand Dominance Right   Extremity/Trunk Assessment Upper Extremity Assessment Upper Extremity Assessment: Overall WFL for tasks assessed   Lower Extremity Assessment Lower Extremity Assessment: Defer to PT evaluation       Communication Communication Communication: No difficulties   Cognition Arousal/Alertness: Awake/alert Behavior During Therapy: WFL for tasks assessed/performed Overall Cognitive Status: Within Functional Limits for tasks assessed                                 General Comments: A&Ox4     General Comments  Pt reports during her recent falls she has blacked out.    Exercises     Shoulder Instructions      Home Living Family/patient expects to be discharged to:: Private residence Living Arrangements: Spouse/significant other;Children Available Help at Discharge: Family;Available 24 hours/day Type of Home: Apartment Home Access:  (Pt reports no stairs, Per previous PT note she had a flight of them 2 months ago)     Home Layout: One level     Bathroom Shower/Tub: Occupational psychologist: Standard     Home Equipment: Rollator (4 wheels);BSC/3in1;Cane - single point;Shower seat          Prior Functioning/Environment Prior Level of Function : Independent/Modified Independent       Physical Assist : ADLs (physical)   ADLs (physical): Bathing;Dressing Mobility Comments: Uses rollator, does not drive ADLs Comments: Pt reports daugher and husband have been assisting with bADLs PTA due to increased edema since January. Prior to edema buildup Pt was independent in bADLs        OT Problem List: Impaired balance (sitting and/or  standing);Increased edema      OT Treatment/Interventions: Self-care/ADL training;Balance training;DME and/or AE instruction;Therapeutic exercise;Therapeutic activities;Patient/family education    OT Goals(Current goals can be found in the care plan section) Acute Rehab OT Goals Patient Stated Goal: Go home OT Goal Formulation: With patient Time For Goal Achievement: 08/11/22 Potential to Achieve Goals: Good ADL Goals Pt Will Perform Grooming: standing;with supervision (With Rollator standing at sink) Pt Will Perform Lower Body Dressing: sitting/lateral leans;with supervision (Sittig EOB using AE prn) Pt Will Transfer to Toilet: with supervision;ambulating (using Rollator)  OT Frequency: Min 2X/week    Co-evaluation              AM-PAC OT "6 Clicks" Daily Activity     Outcome Measure Help from another person eating meals?: None Help from another person taking care of personal grooming?: A Little Help from another person toileting, which includes using toliet, bedpan, or urinal?: A Lot Help from another person bathing (including washing, rinsing, drying)?: A Lot Help from another person to put on and taking off regular upper body clothing?: A Little Help from another person to put on and taking off regular lower body clothing?: A Lot 6 Click Score: 16   End of Session Equipment Utilized During Treatment: Gait belt;Rollator (4 wheels) Nurse Communication: Mobility status  Activity Tolerance: Patient tolerated treatment well Patient  left: in bed;with call bell/phone within reach;with family/visitor present  OT Visit Diagnosis: Unsteadiness on feet (R26.81);History of falling (Z91.81)                Time: [id number] OT Time Calculation (min): 31 min Charges:  OT General Charges $OT Visit: 1 Visit OT Evaluation $OT Eval Moderate Complexity: 1 Mod OT Treatments $Therapeutic Activity: 8-22 mins  07/28/2022  AB, OTR/L  Acute Rehabilitation Services  Office: [phone]    Cori Razor 07/28/2022, 9:25 AM

## 2022-07-28 NOTE — Progress Notes (Signed)
Interval history  Feeling better this morning.  Walked with PT. Denies dizziness, heart palpitations, chest pain, shortness of breath.    She also reports that she has not had a bowel movement in two days and feels as though her abdomen is full. She used Miralax at home but now is unsure of using it because she read a warning on the label that it was not good for people with kidney issues.  Physical exam Blood pressure 113/63, pulse 77, temperature 98 F (36.7 C), temperature source Oral, resp. rate 18, height 4\' 8"  (1.422 m), weight 64.6 kg, SpO2 100 %.  Comfortable appearing Heart rate is normal, rhythm is irregularly irregular, radial pulses 2+, bilateral pitting edema past knees, 1+ edema in her RUE, dependent pitting edema in hips Breathing is regular and unlabored on room air, lungs are clear to auscultation Skin is warm and dry Alert and oriented, no gross neurologic deficits Pleasant, mood and affect concordant  Intake/Output Summary (Last 24 hours) at 07/28/2022 1115 Last data filed at 07/27/2022 2350 Gross per 24 hour  Intake 769.79 ml  Output --  Net 769.79 ml   Net IO Since Admission: -90.4 mL [07/28/22 1115]  Labs, images, and other studies Sodium 122 (124 yesterday) Creatinine 1.12 (0.84 yesterday) Albumin <1.5 (1.7 yesterday)  Assessment and plan Hospital day 2  Rita Myers is a 77 y.o. with membranous glomerulopathy and nephrotic syndrome who presents after syncope with fall found to be volume overloaded with hyponatremia.  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  Albumin <1.5 today.  She is still edematous in her lower extremities and right upper  extremity.  IV Lasix was discontinued due to patient's sodium correcting too rapidly after the first administration yesterday. Given her generalized edema we will adjust the Lasix to 60mg  one-time dose and then recheck BMP this afternoon and monitor sodium to ensure it is rising appropriately.  She does not appear to be symptomatic from her hyponatremia at this time.  We will continue frequent serum sodium checks with diuresis.  Continue 2 L fluid restriction.  Also gets rituximab outpatient for her THSD7A-associated membranous nephropathy, will continue this therapy while she is here.  Appreciate nephrology input on this case. - Every 4 hour serum sodium - Daily renal function panel - Furosemide 60 mg IV x1, then recheck BMP - Continue Rituximab  Syncope with fall Orthostatic vital signs negative, making reflex syncope more likely.  Fall associated with prodrome of lightheadedness. No palpitations, arrhythmia less likely.  Is wearing a 30-day event monitor, prescribed for syncope.  This was interrogated by device rep yesterday and revealed sinus tachycardia around time of event but no other arrhythmia. - Continue monitoring on telemetry  Acute on Chronic systolic heart failure Cardiomyopathy of unclear etiology.  EF 40 to 45% on echo from January 2024.  No shortness of breath orthopnea, suspect volume overload secondary to renal disease rather than heart failure.  Will continue home medications for now.  Blood pressure stable. - Losartan 50 mg daily - Metoprolol succinate 25 mg daily - Diurese as above  Atrial fibrillation Rate controlled.  Anticoagulated. - Eliquis 5 mg twice daily  Constipation We can  use Miralax safely without renal dosage adjustments for patient. Will order Miralax daily.  -Start Miralax 17g  Diet: Regular IVF: No fluids, diuresing VTE: Therapeutic Eliquis Code: Full PT/OT recommendations: Consulted, recommendations pending Family Update: Husband at  bedside  Discharge plan: Pending workup and treatment of syncope and hyponatremia   Signature: Marisa Cyphers, Medical Student  Attestation for Student Documentation:  I personally was present and performed or re-performed the history, physical exam and medical decision-making activities of this service and have verified that the service and findings are accurately documented in the student's note.  [name], MD 07/28/2022, 2:09 PM

## 2022-07-28 NOTE — Progress Notes (Signed)
On call physician notified for worsening BLE edema and new RUE edema.

## 2022-07-28 NOTE — TOC Progression Note (Signed)
Transition of Care [facility]) - Progression Note    Patient Details  Name: Rita Myers MRN: [id number] Date of Birth: Dec 30, 1945  [facility] CM/SW Contact  Zenon Mayo, RN Phone Number: 07/28/2022, 12:19 PM  Clinical Narrative:    from home, hyponatremia, hypovolemia, nephrotic syndrome, on rituxan infusion. TOC following.        Expected Discharge Plan and Services                                               Social Determinants of Health (SDOH) Interventions SDOH Screenings   Food Insecurity: No Food Insecurity (05/19/2022)  Housing: Low Risk  (05/19/2022)  Transportation Needs: No Transportation Needs (05/19/2022)  Utilities: Not At Risk (05/19/2022)  Depression (PHQ2-9): Low Risk  (06/05/2022)  Tobacco Use: Low Risk  (07/26/2022)    Readmission Risk Interventions     No data to display

## 2022-07-28 NOTE — Plan of Care (Signed)

## 2022-07-29 LAB — SODIUM
Sodium: 129 mmol/L — ABNORMAL LOW (ref 135–145)
Sodium: 131 mmol/L — ABNORMAL LOW (ref 135–145)
Sodium: 131 mmol/L — ABNORMAL LOW (ref 135–145)
Sodium: 130 mmol/L — ABNORMAL LOW (ref 135–145)
Sodium: 132 mmol/L — ABNORMAL LOW (ref 135–145)

## 2022-07-29 MED ORDER — ENSURE ENLIVE PO LIQD
237.0000 mL | Freq: Two times a day (BID) | ORAL | Status: DC
  Administered 2022-07-29 – 2022-08-03 (×8): 237 mL via ORAL

## 2022-07-29 MED ORDER — FUROSEMIDE 10 MG/ML IJ SOLN
80.0000 mg | Freq: Four times a day (QID) | INTRAMUSCULAR | Status: DC
  Administered 2022-07-29 – 2022-07-30 (×4): 80 mg via INTRAVENOUS
  Filled 2022-07-29 (×4): qty 8

## 2022-07-29 MED ORDER — ADULT MULTIVITAMIN W/MINERALS CH
1.0000 | ORAL_TABLET | Freq: Every day | ORAL | Status: DC
  Administered 2022-07-30 – 2022-08-03 (×5): 1 via ORAL
  Filled 2022-07-29 (×5): qty 1

## 2022-07-29 MED ORDER — LACTATED RINGERS IV BOLUS
500.0000 mL | Freq: Once | INTRAVENOUS | Status: AC
  Administered 2022-07-29: 500 mL via INTRAVENOUS

## 2022-07-29 NOTE — Progress Notes (Signed)
Subjective:   Stable overnight Serum sodium 131 this morning, stable GFR remains normal, serum albumin 2.0 Not a great diuresis in the past 24 hours, weights are down to maybe 1.5 kg from presentation Currently receiving parenteral furosemide 80 mg twice daily   Objective Vital signs in last 24 hours: Vitals:   07/29/22 0500 07/29/22 0900 07/29/22 1000 07/29/22 1004  BP:  (!) 113/58 (!) 148/88 (!) 113/58  Pulse:  82 79 79  Resp: 16 18  17   Temp: 97.6 F (36.4 C) 97.6 F (36.4 C)  98.1 F (36.7 C)  TempSrc: Oral Oral  Oral  SpO2:  99% 99% 100%  Weight: 63.8 kg     Height:       Weight change: -0.771 kg  Intake/Output Summary (Last 24 hours) at 07/29/2022 1132 Last data filed at 07/29/2022 0900 Gross per 24 hour  Intake 834.92 ml  Output 1950 ml  Net -1115.08 ml     Assessment/Plan:  Hypervolemic hyponatremia - due to nephrotic syndrome.  Asymptomatic.  Failed outpatient oral diuresis.  Improved sodium but remains hypervolemic.  Increase frequency of diuresis today.  Dietitian consult for low-sodium, fluid restricted diet.  Not ready to transition to oral diuretics. 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  Continue with ARB and IV lasix as above.  Continue apixaban for DVT prevention Syncope - likely orthostatic with use of diuretics and beta-blocker.  Stable blood pressures.  No further recurrence. Atrial fibrillation - rate controlled and on Eliquis. Hypoalbuminemia - due to nephrotic syndrome. HTN - stable.   Nutrition - heart healthy diet.  Needs low-sodium and fluid restricted diet, dietitian consult today. Hyperlipidemia on rosuvastatin  [name]    Labs: Basic Metabolic Panel: Recent Labs  Lab 07/27/22 0301 07/27/22 1116 07/28/22 0256 07/28/22 0716 07/28/22 2301 07/29/22 0310 07/29/22 0710  NA 123*   < > 122*   < > 130* 129* 131*  K 4.3  --  3.7  --  4.4   --   --   CL 93*  --  94*  --  98  --   --   CO2 23  --  23  --  26  --   --   GLUCOSE 84  --  111*  --  110*  --   --   BUN 30*  --  36*  --  39*  --   --   CREATININE 0.84  --  1.12*  --  0.91  --   --   CALCIUM 7.9*  --  7.5*  --  8.2*  --   --   PHOS 4.2  --  4.2  --  4.2  --   --    < > = values in this interval not displayed.    Liver Function Tests: Recent Labs  Lab 07/26/22 2149 07/27/22 0301 07/28/22 0256 07/28/22 2301  AST 28  --   --   --   ALT 18  --   --   --   ALKPHOS 44  --   --   --   BILITOT 0.8  --   --   --   PROT 5.2*  --   --   --   ALBUMIN 1.6* 1.7* <1.5* 2.0*    No results for input(s): "LIPASE", "AMYLASE" in the last 168 hours. No results for input(s): "AMMONIA" in the last 168 hours.  CBC: Recent Labs  Lab 07/26/22 2149  WBC 8.8  NEUTROABS 6.5  HGB 12.3  HCT 36.3  MCV 91.2  PLT 198    Cardiac Enzymes: No results for input(s): "CKTOTAL", "CKMB", "CKMBINDEX", "TROPONINI" in the last 168 hours. CBG: No results for input(s): "GLUCAP" in the last 168 hours.  Iron Studies: No results for input(s): "IRON", "TIBC", "TRANSFERRIN", "FERRITIN" in the last 72 hours. Studies/Results: No results found. Medications: Infusions:  famotidine (PEPCID) IV     sodium chloride      Scheduled Medications:  apixaban  5 mg Oral BID   feeding supplement  237 mL Oral BID BM   furosemide  80 mg Intravenous BID   losartan  50 mg Oral Daily   metoprolol succinate  25 mg Oral Daily   [START ON 07/30/2022] multivitamin with minerals  1 tablet Oral Daily   polyethylene glycol  17 g Oral Daily   rosuvastatin  10 mg Oral Daily    have reviewed scheduled and prn medications.  Physical Exam: General: NAD, sitting up eating, comfortable, conversational, husband at bedside Heart: Irregularly irregular, NRMG Lungs: dec BS at bases, no rales or wheezing Abdomen: non tender, non-distended Extremities:  2+ pitting edema to level of thighs    07/29/2022,11:32 AM   LOS: 2 days      I have seen and examined this patient and agree with plan and assessment in the above note with renal recommendations/intervention highlighted. Unfortunately, no accurate I's/O's documented.  Would recommend purewik to help with collecting and measuring UOP.  Overall, she feels better and that she is "lighter".  Slight bump in Scr.  Na improving, however changed from tid to bid for unclear reasons.  Continue to follow.  Meredith Leeds Xochilt Conant,MD 07/29/2022 11:32 AM

## 2022-07-29 NOTE — Plan of Care (Signed)
Nutrition Education Note  RD consulted for nutrition education regarding CHF.  77 y/o female with h/o Nephrotic syndrome with MPGN, PAF, CHF, HLD, HTN, cardiomyopathy, pulmonary hypertension and hypothyroidism who is admitted with syncope and CHF.   Spoke with pt's daughter via phone. Daughter reports pt with good appetite and oral intake at baseline. Daughter prepares patient's meals for her and reports that pt has had two recent admissions for CHF.   RD provided "Low Sodium Nutrition Therapy" handout from the [employer] via mail. Reviewed patient's dietary recall. Provided examples on ways to decrease sodium intake in diet. Discouraged intake of processed foods and use of salt shaker. Encouraged fresh fruits and vegetables as well as whole grain sources of carbohydrates to maximize fiber intake.   RD discussed why it is important for patient to adhere to diet recommendations, and emphasized the role of fluids, foods to avoid, and importance of weighing self daily. Teach back method used.  Expect good compliance.  Body mass index is 31.54 kg/m. Pt meets criteria for obese based on current BMI.  Current diet order is HH, patient is consuming approximately 100% of meals at this time. Labs and medications reviewed. No further nutrition interventions warranted at this time. RD contact information provided. If additional nutrition issues arise, please re-consult RD.   Ensure Enlive po BID, each supplement provides 350 kcal and 20 grams of protein.  Koleen Distance MS, RD, LDN Please refer to [facility] for RD and/or RD on-call/weekend/after hours pager

## 2022-07-29 NOTE — Progress Notes (Signed)
Interval history Small dose of IV fluids administered overnight for rapid correction of sodium.  Sodium stabilized and is appropriate this morning.  Overall feeling well.  Swelling improving.  No shortness of breath.  Moving around the unit well.  Still has some tingling sensation in the right arm.  This has been ongoing for several weeks, since the onset of her kidney problems.  Sometimes she moves her hand, opens and closes her fist for relief.  Physical exam Blood pressure (!) 127/58, pulse 72, temperature 97.6 F (36.4 C), temperature source Oral, resp. rate 16, height 4\' 8"  (1.422 m), weight 63.8 kg, SpO2 98 %.  Comfortable appearing, enjoying breakfast Heart rate normal, rhythm irregularly irregular, bilateral radial pulses 2+, marked lower extremity edema bilaterally Breathing is regular and unlabored on room air, lungs clear without crackles or wheezes Skin is warm and dry Alert and oriented, grip strength equal, no gross neurologic deficits Pleasant, mood and affect concordant  Weight change: -0.771 kg   Intake/Output Summary (Last 24 hours) at 07/29/2022 0644 Last data filed at 07/29/2022 0416 Gross per 24 hour  Intake 714.92 ml  Output 1550 ml  Net -835.08 ml   Net IO Since Admission: -925.48 mL [07/29/22 0644]  Lab Results  Component Value Date/Time   NA 131 (L) 07/29/2022 07:10 AM   NA 129 (L) 07/29/2022 03:10 AM   NA 130 (L) 07/28/2022 11:01 PM   NA 130 (L) 07/28/2022 10:59 PM   NA 127 (L) 07/28/2022 07:57 PM   NA 126 (L) 07/28/2022 03:17 PM   NA 123 (L) 07/28/2022 11:12 AM   NA 125 (L) 07/28/2022 07:16 AM   NA 122 (L) 07/28/2022 02:56 AM   NA 124 (L) 07/27/2022 11:17 PM   NA 124 (L) 07/27/2022 07:06 PM   NA 131 (L) 07/27/2022 03:53 PM   NA 126 (L) 07/27/2022 11:16 AM   NA 123 (L) 07/27/2022 03:01 AM   NA 120 (L) 07/26/2022 09:49 PM   Assessment and plan Hospital day 2  Rita Myers is a 77 y.o. 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  Renal function stable.  Lasix were appropriately held yesterday and free water administered.  Still remains with significant lower extremity edema but no crackles, JVD, or signs suggestive of pulmonary edema and left heart congestion.  Will resume diuresis while continuing to closely monitor serum sodium.  Goal is for normal sodium prior to discharge given this patient's at high risk for fall at home.  Continue 2 L fluid restriction.  I's/O not well documented but good subjective urine output.  Status post rituximab for membranous nephropathy yesterday. - Every 4 hour serum sodium - Daily renal function panel - Furosemide 80 mg IV twice daily - Strict I's/O - Continue Rituximab   Acute on Chronic systolic heart failure Cardiomyopathy of unclear etiology.  EF 40 to 45% on echo from January 2024.  No shortness of breath orthopnea, suspect volume overload secondary to renal disease rather than heart failure.  Will continue home medications for now.  Blood pressure stable. -  Losartan 50 mg daily - Metoprolol succinate 25 mg daily - Diuretics as above  Syncope with fall Orthostatic vital signs negative, making reflex syncope more likely.  Fall associated with prodrome of lightheadedness. No palpitations, arrhythmia less likely.  Is wearing a 30-day event monitor, prescribed for syncope.  This was interrogated by device rep yesterday and revealed sinus tachycardia around time of event but no other arrhythmia.  No further workup, goal is for normal serum sodium before  discharge to further reduce risk for falls.   Atrial fibrillation Rate controlled.  Anticoagulated. - Eliquis 5 mg twice daily   Constipation - MiraLAX  Transitions of care OT recommendation for home health occupational therapy and 24/7 supervision and support from family noted.  Face-to-face order placed today.  Will enlist TOC assistance for home health OT referral.  Diet: Heart healthy IVF: Diuresing today VTE: Therapeutic Eliquis Code: Full PT/OT recommendations: Ambulating well with rollator, no PT follow-up, home health OT, 24/7 supervision and support from family Family Update: At bedside.  Discharge plan: pending treatment of hyponatremia and nephrotic syndrome.   [name] MD 07/29/2022, 6:44 AM  Pager: [phone] After 5pm on weekdays and 1pm on weekends: [phone]

## 2022-07-29 NOTE — Plan of Care (Signed)

## 2022-07-30 DIAGNOSIS — E871 Hypo-osmolality and hyponatremia: Secondary | ICD-10-CM | POA: Diagnosis not present

## 2022-07-30 DIAGNOSIS — N049 Nephrotic syndrome with unspecified morphologic changes: Secondary | ICD-10-CM | POA: Diagnosis not present

## 2022-07-30 LAB — SODIUM
Sodium: 132 mmol/L — ABNORMAL LOW (ref 135–145)
Sodium: 133 mmol/L — ABNORMAL LOW (ref 135–145)
Sodium: 136 mmol/L (ref 135–145)

## 2022-07-30 LAB — RENAL FUNCTION PANEL
Albumin: 1.6 g/dL — ABNORMAL LOW (ref 3.5–5.0)
Anion gap: 6 (ref 5–15)
BUN: 45 mg/dL — ABNORMAL HIGH (ref 8–23)
CO2: 26 mmol/L (ref 22–32)
Calcium: 8.3 mg/dL — ABNORMAL LOW (ref 8.9–10.3)
Chloride: 101 mmol/L (ref 98–111)
Creatinine, Ser: 0.94 mg/dL (ref 0.44–1.00)
GFR, Estimated: >60 mL/min (ref >60)
Glucose, Bld: 113 mg/dL — ABNORMAL HIGH (ref 70–99)
Phosphorus: 5 mg/dL — ABNORMAL HIGH (ref 2.5–4.6)
Potassium: 4.1 mmol/L (ref 3.5–5.1)
Sodium: 133 mmol/L — ABNORMAL LOW (ref 135–145)

## 2022-07-30 LAB — MAGNESIUM: Magnesium: 2.4 mg/dL (ref 1.7–2.4)

## 2022-07-30 MED ORDER — POLYETHYLENE GLYCOL 3350 17 G PO PACK
17.0000 g | PACK | Freq: Two times a day (BID) | ORAL | Status: DC
  Administered 2022-07-30 – 2022-08-02 (×3): 17 g via ORAL
  Filled 2022-07-30 (×6): qty 1

## 2022-07-30 MED ORDER — SENNOSIDES-DOCUSATE SODIUM 8.6-50 MG PO TABS
1.0000 | ORAL_TABLET | Freq: Every day | ORAL | Status: DC
  Administered 2022-07-30 – 2022-08-03 (×5): 1 via ORAL
  Filled 2022-07-30 (×4): qty 1

## 2022-07-30 MED ORDER — FUROSEMIDE 10 MG/ML IJ SOLN
80.0000 mg | Freq: Three times a day (TID) | INTRAMUSCULAR | Status: DC
  Administered 2022-07-30 – 2022-07-31 (×2): 80 mg via INTRAVENOUS
  Filled 2022-07-30 (×2): qty 8

## 2022-07-30 NOTE — Progress Notes (Signed)
Interval history NAEON.   Overall feeling well. Swelling improving. No shortness of breath or chest pain. Moving around the unit well. She reports that she has not had a bowel movement in 4 days. Still has some tingling sensation in the right arm but this improves when patient squeezes her fist. This has been ongoing for several weeks, since the onset of her kidney problems.  Physical exam Blood pressure (!) 129/59, pulse 81, temperature 98.4 F (36.9 C), temperature source Oral, resp. rate 17, height 4\' 8"  (1.422 m), weight 62 kg, SpO2 97 %.  Comfortable appearing, in no acute distress Heart rate normal, rhythm irregularly irregular, bilateral radial pulses 2+, marked lower extremity edema bilaterally Breathing is regular and unlabored on room air, lungs clear without crackles or wheezes Skin is warm and dry Alert and oriented, grip strength equal, no gross neurologic deficits Pleasant, mood and affect concordant  Weight change: -0.121 kg   Intake/Output Summary (Last 24 hours) at 07/30/2022 0649 Last data filed at 07/30/2022 0600 Gross per 24 hour  Intake 240 ml  Output 2300 ml  Net -2060 ml   Net IO Since Admission: -2,985.48 mL [07/30/22 0649]  Labs Na 133 this a.m.  Assessment and plan Hospital day 3  Rita Myers is a 77 y.o. with nephrotic syndrome due to membranous nephropathy admitted for hypervolemic hyponatremia after a fall at home.  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 Lasix given overnight. Still has lower extremity edema but no crackles, JVD, or signs suggestive of pulmonary edema and left heart congestion.  Will continue to diurese with Lasix Q8 per nephrology  while closely monitoring serum sodium. Goal is for normal sodium prior to discharge given this patient's at high risk for fall at home.  Continue 2 L fluid restriction.  I's/O not well documented but good subjective urine output. Rituximab given 3/15. - Twice daily serum sodium - Daily renal function panel - Furosemide 80 mg IV Q8 - Strict I's/O - Continue Rituximab   Acute on Chronic systolic heart failure Cardiomyopathy of unclear etiology.  EF 40 to 45% on echo from January 2024.  No shortness of breath or orthopnea. Will continue home medications for now. Blood pressure stable. - Losartan 50 mg daily - Metoprolol succinate 25 mg daily - Diuretics as above  Syncope with fall Orthostatic vital signs negative, making reflex syncope more likely.  Fall associated with prodrome of lightheadedness. No palpitations, arrhythmia less likely.  Is wearing a 30-day event monitor, prescribed for syncope.  This was interrogated by device rep yesterday and revealed sinus tachycardia around time of event but no other arrhythmia.  No further workup, goal is for normal serum sodium before discharge to further reduce risk for falls.   Atrial fibrillation Rate controlled.  Anticoagulated. - Eliquis 5 mg twice daily   Constipation Patient states that she has not had a BM in 4 days. She feels as though her abdomen is full but still not able to go to the bathroom. We will increase her Miralax to twice daily and add Senekot once daily. - MiraLAX 17g BID -Senekot 1 tab daily  Transitions of care OT recommendation for home health occupational therapy and 24/7  supervision and support from family noted.  Will enlist TOC assistance for home health OT referral.  Diet: Heart healthy IVF: Diuresing today VTE: Therapeutic Eliquis Code: Full PT/OT recommendations: Ambulating well with rollator, no PT follow-up, home health OT, 24/7 supervision and support from family Family Update: At bedside.  Discharge plan:  pending treatment of hyponatremia and nephrotic syndrome.   Signed: Marisa Cyphers, Medical Student   Attestation for Student Documentation:  I personally was present and performed or re-performed the history, physical exam and medical decision-making activities of this service and have verified that the service and findings are accurately documented in the student's note.  [name] MD 07/30/2022, 12:15 PM

## 2022-07-30 NOTE — Progress Notes (Signed)
Subjective:   No acute events overnight Na up to 133 Patient reports that her swelling is slightly better Weight down to 62kg UOP  2.3L   Objective Vital signs in last 24 hours: Vitals:   07/29/22 2000 07/30/22 0057 07/30/22 0205 07/30/22 0600  BP: (!) 144/79 (!) 131/58 (!) 129/59   Pulse:  90 81   Resp:  17 17   Temp:  98 F (36.7 C) 98.4 F (36.9 C)   TempSrc:  Oral Oral   SpO2:  100% 97%   Weight:  63.7 kg  62 kg  Height:       Weight change: -0.121 kg  Intake/Output Summary (Last 24 hours) at 07/30/2022 [id number] Last data filed at 07/30/2022 0600 Gross per 24 hour  Intake 240 ml  Output 2300 ml  Net -2060 ml    Assessment/Plan:  Hypervolemic hyponatremia - due to nephrotic syndrome.  Asymptomatic.  Failed outpatient oral diuresis.  Improved sodium to 133 but remains hypervolemic.  Not ready to transition to oral diuretics yet (would likely consider torsemide as PO diuretic of choice). 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  Continue with ARB and IV lasix 80mg  IV TID.  Continue apixaban for DVT prevention. If improvement in vol status stalls, can consider albumin with lasix Syncope - likely orthostatic with use of diuretics and beta-blocker.  Stable blood pressures.  No further recurrence. Atrial fibrillation - rate controlled and on Eliquis. Hypoalbuminemia - due to nephrotic syndrome. HTN - stable.   Nutrition - heart healthy diet.  Needs low-sodium and fluid restricted diet Hyperlipidemia on rosuvastatin  Rita Myers    Labs: Basic Metabolic Panel: Recent Labs  Lab 07/28/22 0256 07/28/22 0716 07/28/22 2301 07/29/22 0310 07/29/22 2053 07/30/22 0048 07/30/22 0324  NA 122*   < > 130*   < > 132* 132* 133*  K 3.7  --  4.4  --   --   --  4.1  CL 94*  --  98  --   --   --  101  CO2 23  --  26  --   --   --  26  GLUCOSE 111*  --  110*  --   --   --  113*  BUN  36*  --  39*  --   --   --  45*  CREATININE 1.12*  --  0.91  --   --   --  0.94  CALCIUM 7.5*  --  8.2*  --   --   --  8.3*  PHOS 4.2  --  4.2  --   --   --  5.0*   < > = values in this interval not displayed.   Liver Function Tests: Recent Labs  Lab 07/26/22 2149 07/27/22 0301 07/28/22 0256 07/28/22 2301 07/30/22 0324  AST 28  --   --   --   --   ALT 18  --   --   --   --   ALKPHOS 44  --   --   --   --   BILITOT 0.8  --   --   --   --   PROT 5.2*  --   --   --   --   ALBUMIN 1.6*   < > <1.5* 2.0* 1.6*   < > = values in this interval not displayed.   No results  for input(s): "LIPASE", "AMYLASE" in the last 168 hours. No results for input(s): "AMMONIA" in the last 168 hours. CBC: Recent Labs  Lab 07/26/22 2149  WBC 8.8  NEUTROABS 6.5  HGB 12.3  HCT 36.3  MCV 91.2  PLT 198   Cardiac Enzymes: No results for input(s): "CKTOTAL", "CKMB", "CKMBINDEX", "TROPONINI" in the last 168 hours. CBG: No results for input(s): "GLUCAP" in the last 168 hours.  Iron Studies: No results for input(s): "IRON", "TIBC", "TRANSFERRIN", "FERRITIN" in the last 72 hours. Studies/Results: No results found. Medications: Infusions:  famotidine (PEPCID) IV     sodium chloride      Scheduled Medications:  apixaban  5 mg Oral BID   feeding supplement  237 mL Oral BID BM   furosemide  80 mg Intravenous Q6H   losartan  50 mg Oral Daily   metoprolol succinate  25 mg Oral Daily   multivitamin with minerals  1 tablet Oral Daily   polyethylene glycol  17 g Oral Daily   rosuvastatin  10 mg Oral Daily    have reviewed scheduled and prn medications.  Physical Exam: General: NAD, laying flat in bed Heart: Irregularly irregular, NRMG Lungs: slightly decreased air entry bibasilar, no rales or wheezing, normal WOB Abdomen: non tender, non-distended, no abd wall edema Extremities:  2+ pitting edema to level of thighs    07/30/2022,7:33 AM  LOS: 3 days

## 2022-07-30 NOTE — Progress Notes (Signed)
Pt arrived from [place] via w/c.  Pt's husband is with her.  Pt denies any pain or shob.  2-3+ edema to BLE which pt reports is a little better.  Pt oriented to room and call bell in reach.      07/30/22 0205  Vitals  Temp 98.4 F (36.9 C)  Temp Source Oral  BP (!) 129/59  MAP (mmHg) 81  BP Location Left Arm  BP Method Automatic  Patient Position (if appropriate) Lying  Pulse Rate 81  Pulse Rate Source Monitor  Resp 17  MEWS COLOR  MEWS Score Color Green  Oxygen Therapy  SpO2 97 %  O2 Device Room Air   Ayesha Mohair BSN RN [facility] 07/30/2022, 3:46 AM

## 2022-07-30 NOTE — Progress Notes (Signed)
Patient downgraded to lower level of care. Assessment complete and benign. Report called to 2W RN; care responsibilities transferred.

## 2022-07-31 DIAGNOSIS — I5023 Acute on chronic systolic (congestive) heart failure: Secondary | ICD-10-CM | POA: Diagnosis not present

## 2022-07-31 DIAGNOSIS — N045 Nephrotic syndrome with diffuse mesangiocapillary glomerulonephritis: Secondary | ICD-10-CM | POA: Diagnosis not present

## 2022-07-31 DIAGNOSIS — I480 Paroxysmal atrial fibrillation: Secondary | ICD-10-CM | POA: Diagnosis not present

## 2022-07-31 LAB — RENAL FUNCTION PANEL
Albumin: <1.5 g/dL — ABNORMAL LOW (ref 3.5–5.0)
Anion gap: 7 (ref 5–15)
BUN: 55 mg/dL — ABNORMAL HIGH (ref 8–23)
CO2: 25 mmol/L (ref 22–32)
Calcium: 8.3 mg/dL — ABNORMAL LOW (ref 8.9–10.3)
Chloride: 103 mmol/L (ref 98–111)
Creatinine, Ser: 0.99 mg/dL (ref 0.44–1.00)
GFR, Estimated: 58 mL/min — ABNORMAL LOW (ref >60)
Glucose, Bld: 124 mg/dL — ABNORMAL HIGH (ref 70–99)
Phosphorus: 5.4 mg/dL — ABNORMAL HIGH (ref 2.5–4.6)
Potassium: 4 mmol/L (ref 3.5–5.1)
Sodium: 135 mmol/L (ref 135–145)

## 2022-07-31 MED ORDER — FUROSEMIDE 10 MG/ML IJ SOLN
120.0000 mg | Freq: Three times a day (TID) | INTRAVENOUS | Status: DC
  Administered 2022-07-31 – 2022-08-01 (×3): 120 mg via INTRAVENOUS
  Filled 2022-07-31 (×2): qty 12
  Filled 2022-07-31: qty 10
  Filled 2022-07-31: qty 12
  Filled 2022-07-31: qty 10

## 2022-07-31 NOTE — Progress Notes (Signed)
                 Interval history Feeling well, swelling is improved.  Small bowel movement this morning.  Physical exam Blood pressure (!) 151/73, pulse 91, temperature 97.9 F (36.6 C), temperature source Oral, resp. rate 18, height 4\' 8"  (1.422 m), weight 62 kg, SpO2 100 %.  Comfortable appearing Strong radial pulse Breathing is regular and unlabored Skin is warm and dry Bilateral lower extremity edema to knees  Intake/Output Summary (Last 24 hours) at 07/31/2022 1352 Last data filed at 07/31/2022 1337 Gross per 24 hour  Intake 300 ml  Output 1250 ml  Net -950 ml   Net IO Since Admission: -4,417.48 mL [07/31/22 1352]  Labs, images, and other studies Sodium 135 Potassium 4 BUN/creatinine 55/0.99 Phosphorus 5.5  Assessment and plan Hospital day 4  Rita Myers is a 77 y.o. admitted for hypervolemic hyponatremia due to nephrotic syndrome.  Improving.  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  Still grossly volume overloaded on exam.  Continue IV diuresis and fluid restriction.  Anticipate home on torsemide in the next couple of days. - Daily renal function panel - Furosemide 120 mg IV Q8 - Strict I's/O   Acute on Chronic systolic heart failure Cardiomyopathy of unclear etiology.  EF 40 to 45% on echo from January 2024.  No shortness of breath or orthopnea. Will continue home medications for now. Blood pressure stable. - Losartan 50 mg daily - Metoprolol succinate 25 mg daily - Diuretics as above   Syncope with fall Orthostatic vital signs negative, making reflex syncope more likely.  Fall associated with prodrome of lightheadedness. No palpitations, arrhythmia less  likely.  Is wearing a 30-day event monitor, prescribed for syncope.  This was interrogated by device rep and revealed sinus tachycardia around time of event but no other arrhythmia.  No further workup, goal is for normal serum sodium before discharge to further reduce risk for falls.   Atrial fibrillation Rate controlled.  Anticoagulated. - Eliquis 5 mg twice daily   Constipation Bowel movement this a.m.  Feeling better from symptomatic standpoint. - Scheduled MiraLAX and senna   Transitions of care OT recommendation for home health occupational therapy and 24/7 supervision and support from family noted.  Will enlist TOC assistance for home health OT referral.  Diet: Heart healthy IVF: None, diuresing VTE: Therapeutic Eliquis Code: Full PT/OT recommendations: Home health OT Family Update: At bedside  Discharge plan: Pending further diuresis  Rita Gasser MD 07/31/2022, 1:52 PM  Pager: [phone] After 5pm on weekdays and 1pm on weekends: [phone]

## 2022-07-31 NOTE — TOC Progression Note (Signed)
Transition of Care [facility]) - Progression Note    Patient Details  Name: Rita Myers MRN: [id number] Date of Birth: [birth year]  Transition of Care [facility]) CM/SW Contact  Curlene Labrum, RN Phone Number: 07/31/2022, 3:29 PM  Clinical Narrative:     Transition of Care (TOC) Screening Note   Patient Details  Name: Rita Myers Date of Birth: 03-30-1946   Transition of Care [facility]) CM/SW Contact:    Curlene Labrum, RN Phone Number: 07/31/2022, 3:29 PM    Transition of Care Department [facility]) has reviewed patient and no TOC needs have been identified at this time.   We will continue to monitor patient advancement through interdisciplinary progression rounds. If new patient transition needs arise, please place a TOC consult.          Expected Discharge Plan and Services                                               Social Determinants of Health (SDOH) Interventions SDOH Screenings   Food Insecurity: No Food Insecurity (05/19/2022)  Housing: Low Risk  (05/19/2022)  Transportation Needs: No Transportation Needs (05/19/2022)  Utilities: Not At Risk (05/19/2022)  Depression (PHQ2-9): Low Risk  (06/05/2022)  Tobacco Use: Low Risk  (07/26/2022)    Readmission Risk Interventions     No data to display

## 2022-07-31 NOTE — Progress Notes (Signed)
Subjective:   No acute events overnight Na up to 135 Patient reports that her swelling is slightly better No AM weight UOP  1L Husband and daughter updated   Objective Vital signs in last 24 hours: Vitals:   07/30/22 1607 07/30/22 1936 07/31/22 0506 07/31/22 0739  BP: 132/64 (!) 148/72 136/76 (!) 151/73  Pulse: 95 95 77 91  Resp: 16 16 16 18   Temp: 98.4 F (36.9 C) 98.1 F (36.7 C) 98.2 F (36.8 C) 97.9 F (36.6 C)  TempSrc: Oral Oral Oral Oral  SpO2: 100% 100% 100% 100%  Weight:      Height:       Weight change:   Intake/Output Summary (Last 24 hours) at 07/31/2022 1228 Last data filed at 07/31/2022 0940 Gross per 24 hour  Intake 300 ml  Output 1050 ml  Net -750 ml     Assessment/Plan:  Hypervolemic hyponatremia - due to nephrotic syndrome.  Asymptomatic.  Resolved.  Failed outpatient oral diuresis.  Continue fluid restriction Hypervolemia secondary to nephrotic syndrome: Slowly improving.  Increase furosemide dosing today.  Anticipate transition to oral torsemide tomorrow.  Continue low-sodium diet.   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  Continue with ARB   Continue apixaban for DVT prevention. If improvement in vol status stalls, can consider albumin with lasix Syncope - likely orthostatic with use of diuretics and beta-blocker.  Stable blood pressures.  No further recurrence. PT and OT Atrial fibrillation - rate controlled and on Eliquis. Hypoalbuminemia - due to nephrotic syndrome. HTN - stable.   Nutrition - heart healthy diet.  Needs low-sodium and fluid restricted diet Hyperlipidemia on rosuvastatin  [name]    Labs: Basic Metabolic Panel: Recent Labs  Lab 07/28/22 2301 07/29/22 0310 07/30/22 0324 07/30/22 0702 07/30/22 1758 07/31/22 0416  NA 130*   < > 133* 133* 136 135  K 4.4  --  4.1  --   --  4.0  CL 98  --  101  --   --  103  CO2 26  --  26  --   --  25  GLUCOSE  110*  --  113*  --   --  124*  BUN 39*  --  45*  --   --  55*  CREATININE 0.91  --  0.94  --   --  0.99  CALCIUM 8.2*  --  8.3*  --   --  8.3*  PHOS 4.2  --  5.0*  --   --  5.4*   < > = values in this interval not displayed.    Liver Function Tests: Recent Labs  Lab 07/26/22 2149 07/27/22 0301 07/28/22 2301 07/30/22 0324 07/31/22 0416  AST 28  --   --   --   --   ALT 18  --   --   --   --   ALKPHOS 44  --   --   --   --   BILITOT 0.8  --   --   --   --   PROT 5.2*  --   --   --   --   ALBUMIN 1.6*   < > 2.0* 1.6* <1.5*   < > = values in this interval not displayed.    No results for input(s): "LIPASE", "AMYLASE" in the last 168 hours. No results for input(s): "AMMONIA" in the last 168 hours. CBC: Recent Labs  Lab 07/26/22  2149  WBC 8.8  NEUTROABS 6.5  HGB 12.3  HCT 36.3  MCV 91.2  PLT 198    Cardiac Enzymes: No results for input(s): "CKTOTAL", "CKMB", "CKMBINDEX", "TROPONINI" in the last 168 hours. CBG: No results for input(s): "GLUCAP" in the last 168 hours.  Iron Studies: No results for input(s): "IRON", "TIBC", "TRANSFERRIN", "FERRITIN" in the last 72 hours. Studies/Results: No results found. Medications: Infusions:  furosemide      Scheduled Medications:  apixaban  5 mg Oral BID   feeding supplement  237 mL Oral BID BM   losartan  50 mg Oral Daily   metoprolol succinate  25 mg Oral Daily   multivitamin with minerals  1 tablet Oral Daily   polyethylene glycol  17 g Oral BID   rosuvastatin  10 mg Oral Daily   senna-docusate  1 tablet Oral Daily    have reviewed scheduled and prn medications.  Physical Exam: General: NAD, laying flat in bed Heart: Irregularly irregular, NRMG Lungs: slightly decreased air entry bibasilar, no rales or wheezing, normal WOB Abdomen: non tender, non-distended, no abd wall edema Extremities:  2+ pitting edema to level of thighs    07/31/2022,12:28 PM  LOS: 4 days

## 2022-07-31 NOTE — Progress Notes (Signed)
Occupational Therapy Treatment and Discharge Summary  Patient Details Name: Geana Lella MRN: [id number] DOB: November 21, 1945 Today's Date: 07/31/2022   History of present illness Pt is 77 year old presented to [facility] on 3/14 after a syncopal episode. Past medical history of HTN, HLD, HFrEF (EF 40-45% 05/2022), paroxysmal A-fib (on Eliquis), nephrotic syndrome, hypothyroidism, and prediabetes   OT comments  Pt continuing to progress towards Pt focused goals and all OT goals met at this time. Pt completing hall level ambulation and bed mobility with Supervision + Rollator. Pt does still require mod-max A with lower body dressing d/t BLE edema but patient's spouse is able to assist. Pt has met 2/3 OT goals, is at or near baseline, and no longer requires skilled acute OT services at this time. Discussed with patient the benefits of outpatient therapy services for edema management if needed but Pt declined further need for services. Pt would benefit from continued mobility with mobility specialist during acute stay. No follow-up OT recommended   Recommendations for follow up therapy are one component of a multi-disciplinary discharge planning process, led by the attending physician.  Recommendations may be updated based on patient status, additional functional criteria and insurance authorization.    Follow Up Recommendations  No OT follow up     Assistance Recommended at Discharge Intermittent Supervision/Assistance  Patient can return home with the following  A little help with walking and/or transfers;A lot of help with bathing/dressing/bathroom;Assistance with cooking/housework;Help with stairs or ramp for entrance;Assist for transportation   Equipment Recommendations  Other (comment) (Pt would benefit from Handheld shower hose)    Recommendations for Other Services      Precautions / Restrictions Precautions Precautions: Fall Precaution Comments: Heart monitor Restrictions Weight Bearing  Restrictions: No       Mobility Bed Mobility Overal bed mobility: Modified Independent Bed Mobility: Supine to Sit, Sit to Supine     Supine to sit: Modified independent (Device/Increase time), HOB elevated Sit to supine: Modified independent (Device/Increase time)        Transfers Overall transfer level: Needs assistance Equipment used: Rollator (4 wheels) Transfers: Sit to/from Stand Sit to Stand: Supervision           General transfer comment: Pt needed to be cued to lock Rollator brakes     Balance Overall balance assessment: Needs assistance Sitting-balance support: Feet supported, No upper extremity supported Sitting balance-Leahy Scale: Normal     Standing balance support: Reliant on assistive device for balance, Bilateral upper extremity supported, During functional activity Standing balance-Leahy Scale: Poor Standing balance comment: Supervision with use of Rollator                           ADL either performed or assessed with clinical judgement   ADL Overall ADL's : Needs assistance/impaired             Lower Body Bathing: Moderate assistance;Maximal assistance;Sitting/lateral leans Lower Body Bathing Details (indicate cue type and reason): Pt Max A to don velcro shoes, Mod A to doff velcro band shoes                       General ADL Comments: Pt completed hall level ambulation with Supervision + Rollator    Extremity/Trunk Assessment Upper Extremity Assessment Upper Extremity Assessment: Overall WFL for tasks assessed   Lower Extremity Assessment Lower Extremity Assessment: Overall WFL for tasks assessed (BLE edema)   Cervical / Trunk Assessment Cervical /  Trunk Assessment: Normal    Vision Baseline Vision/History: 1 Wears glasses Patient Visual Report: No change from baseline Vision Assessment?: No apparent visual deficits   Perception     Praxis      Cognition Arousal/Alertness: Awake/alert Behavior During  Therapy: WFL for tasks assessed/performed Overall Cognitive Status: Within Functional Limits for tasks assessed                                          Exercises      Shoulder Instructions       General Comments  VSS on RA    Pertinent Vitals/ Pain       Pain Assessment Pain Assessment: No/denies pain  Home Living                                          Prior Functioning/Environment              Frequency  Other (comment) (OT d/c)        Progress Toward Goals  OT Goals(current goals can now be found in the care plan section)  Progress towards OT goals: Progressing toward goals  Acute Rehab OT Goals Patient Stated Goal: Go home OT Goal Formulation: With patient Time For Goal Achievement: 08/11/22 Potential to Achieve Goals: Good  Plan Frequency needs to be updated;All goals met and education completed, patient discharged from OT services;Discharge plan needs to be updated    Co-evaluation                 AM-PAC OT "6 Clicks" Daily Activity     Outcome Measure   Help from another person eating meals?: None Help from another person taking care of personal grooming?: None Help from another person toileting, which includes using toliet, bedpan, or urinal?: A Little Help from another person bathing (including washing, rinsing, drying)?: A Little Help from another person to put on and taking off regular upper body clothing?: None Help from another person to put on and taking off regular lower body clothing?: A Lot 6 Click Score: 20    End of Session Equipment Utilized During Treatment: Gait belt;Rollator (4 wheels)  OT Visit Diagnosis: Unsteadiness on feet (R26.81);History of falling (Z91.81)   Activity Tolerance Patient tolerated treatment well   Patient Left in bed;with call bell/phone within reach;with family/visitor present   Nurse Communication Mobility status        Time: [id number] OT Time  Calculation (min): 12 min  Charges: OT General Charges $OT Visit: 1 Visit OT Treatments $Therapeutic Activity: 8-22 mins  07/31/2022  AB, OTR/L  Acute Rehabilitation Services  Office: [phone]   Cori Razor 07/31/2022, 10:59 AM

## 2022-07-31 NOTE — Progress Notes (Signed)
Mobility Specialist - Progress Note   07/31/22 1435  Mobility  Activity Ambulated with assistance in hallway  Level of Assistance Standby assist, set-up cues, supervision of patient - no hands on  Assistive Device Four wheel walker  Distance Ambulated (ft) 300 ft  Activity Response Tolerated well  Mobility Referral Yes  $Mobility charge 1 Mobility   Pt was received in bed and agreeable to mobility. No complaints throughout session. Pt was returned to bed with all needs met.   Franki Monte  Mobility Specialist Please contact via Solicitor or Rehab office at [phone]

## 2022-07-31 NOTE — Care Management Important Message (Signed)
Important Message  Patient Details  Name: Rita Myers MRN: [id number] Date of Birth: [birth year]   Medicare Important Message Given:  Yes     Skeeter Sheard Montine Circle 07/31/2022, 2:35 PM

## 2022-08-01 DIAGNOSIS — I5023 Acute on chronic systolic (congestive) heart failure: Secondary | ICD-10-CM | POA: Diagnosis not present

## 2022-08-01 DIAGNOSIS — N045 Nephrotic syndrome with diffuse mesangiocapillary glomerulonephritis: Secondary | ICD-10-CM | POA: Diagnosis not present

## 2022-08-01 DIAGNOSIS — I480 Paroxysmal atrial fibrillation: Secondary | ICD-10-CM | POA: Diagnosis not present

## 2022-08-01 LAB — RENAL FUNCTION PANEL
Albumin: <1.5 g/dL — ABNORMAL LOW (ref 3.5–5.0)
Anion gap: 8 (ref 5–15)
BUN: 61 mg/dL — ABNORMAL HIGH (ref 8–23)
CO2: 28 mmol/L (ref 22–32)
Calcium: 8.4 mg/dL — ABNORMAL LOW (ref 8.9–10.3)
Chloride: 102 mmol/L (ref 98–111)
Creatinine, Ser: 0.92 mg/dL (ref 0.44–1.00)
GFR, Estimated: >60 mL/min (ref >60)
Glucose, Bld: 112 mg/dL — ABNORMAL HIGH (ref 70–99)
Phosphorus: 5.7 mg/dL — ABNORMAL HIGH (ref 2.5–4.6)
Potassium: 3.8 mmol/L (ref 3.5–5.1)
Sodium: 138 mmol/L (ref 135–145)

## 2022-08-01 MED ORDER — TORSEMIDE 20 MG PO TABS
100.0000 mg | ORAL_TABLET | Freq: Two times a day (BID) | ORAL | Status: DC
  Administered 2022-08-01 – 2022-08-03 (×5): 100 mg via ORAL
  Filled 2022-08-01 (×5): qty 5

## 2022-08-01 NOTE — Progress Notes (Signed)
Mobility Specialist - Progress Note   08/01/22 1017  Mobility  Activity Ambulated with assistance in hallway  Level of Assistance Standby assist, set-up cues, supervision of patient - no hands on  Assistive Device Four wheel walker  Distance Ambulated (ft) 500 ft  Activity Response Tolerated well  Mobility Referral Yes  $Mobility charge 1 Mobility   Pt was received in bed and agreeable to mobility. No complaints throughout session.Pt was returned to bed with all needs met.   Franki Monte  Mobility Specialist Please contact via Solicitor or Rehab office at [phone]

## 2022-08-01 NOTE — Progress Notes (Signed)
                 Interval history Feeling well.  Swelling seems stable.  No shortness of breath.  Moving around the unit well.  Physical exam Blood pressure 136/63, pulse 82, temperature 97.7 F (36.5 C), temperature source Oral, resp. rate 16, height 4\' 8"  (1.422 m), weight 62 kg, SpO2 100 %.  Comfortable appearing Breathing is regular and unlabored Skin is warm and dry Bilateral lower extremity edema to the knees  Intake/Output Summary (Last 24 hours) at 08/01/2022 1401 Last data filed at 08/01/2022 1339 Gross per 24 hour  Intake 306 ml  Output 1750 ml  Net -1444 ml   Net IO Since Admission: -5,861.48 mL [08/01/22 1401]  Labs, images, and other studies Sodium 138 Potassium 3.8 BUN/creatinine 61/0.92 Phosphorus 5.7 Calcium 8.4 Albumin <1.5  Assessment and plan Hospital day [place] is a 77 y.o. admitted for hypervolemic hyponatremia due to nephrotic syndrome.  Hyponatremia has resolved.  Switch to p.o. diuretics today.  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  Continue fluid restriction.  Transition to torsemide p.o. today.  Anticipate home on torsemide in the next couple of days. - Daily renal function panel - Torsemide 100 mg p.o. twice daily - Strict I's/O   Acute on Chronic systolic heart failure Cardiomyopathy of unclear etiology.  EF 40 to 45% on echo from January 2024.  No shortness of breath or orthopnea. Will continue home medications for now. Blood pressure stable. - Losartan 50 mg daily - Metoprolol succinate 25 mg daily - Diuretics as above   Atrial fibrillation Rate controlled.  Anticoagulated. - Eliquis 5 mg twice daily   Constipation Bowel movement this a.m.  Feeling better from symptomatic standpoint. - Scheduled  MiraLAX and senna  Diet: Heart healthy, fluid restriction IVF: Fluid restrict, diuresing VTE: Therapeutic apixaban Code: Full PT/OT recommendations: No outpatient follow-up TOC recommendations: Following Family Update: Husband at bedside  Discharge plan: Pending good diuresis on p.o. torsemide  [name] MD 08/01/2022, 2:01 PM  Pager: [phone] After 5pm or weekend: [phone]

## 2022-08-01 NOTE — TOC Initial Note (Signed)
Transition of Care [facility]) - Initial/Assessment Note    Patient Details  Name: Rita Myers MRN: [id number] Date of Birth: 09-26-1945  Transition of Care [facility]) CM/SW Contact:    Curlene Labrum, RN Phone Number: 08/01/2022, 1:23 PM  Clinical Narrative:                 CM met with the patient and husband at the bedside to discuss TOC needs for home.  The patient lives with the husband at the home and the daughter requests home health services for disease management.  Home Health orders placed for RN and aide at request of the daughter.  I spoke with the daughter and provided her with Medicare choice regarding home health agency and she did not have a preference.  I spoke with Tommi Rumps, [facility] at [place] and the agency accepted for home health services.  I made the daughter aware that Medicare will likely cover for a short period of time for disease management and asked that she plan to follow up with a [place] agency in [place], [place] for private pay personal care services in case she needs additional or continued help in the home.  The patient's daughter is aware.  The patient has Rolator at the bedside.  She will discharge home with the family once she is medically stable for discharge.  Expected Discharge Plan: [place] Barriers to Discharge: Continued Medical Work up   Patient Goals and CMS Choice Patient states their goals for this hospitalization and ongoing recovery are:: to get better and go home CMS Medicare.gov Compare Post Acute Care list provided to:: Patient Choice offered to / list presented to : Patient [place] ownership interest in [facility].provided to:: Patient    Expected Discharge Plan and Services   Discharge Planning Services: CM Consult Post Acute Care Choice: [place] arrangements for the past 2 months: [place]: RN, Nurse's Aide [place] Agency: [place] Date [place]: 08/01/22 Time [place]: 1321 Representative spoke with at [place]: Tommi Rumps, RNCM with [facility] accepted for services  Prior Living Arrangements/Services Living arrangements for the past 2 months: Single Family Home Lives with:: Spouse Patient language and need for interpreter reviewed:: Yes Do you feel safe going back to the place where you live?: Yes      Need for Family Participation in Patient Care: Yes (Comment) Care giver support system in place?: Yes (comment) Current home services: DME (Rolator at home) Criminal Activity/Legal Involvement Pertinent to Current Situation/Hospitalization: No - Comment as needed  Activities of Daily Living      Permission Sought/Granted Permission sought to share information with : Case Manager Permission granted to share information with : Yes, Release of Information Signed     Permission granted to share info w AGENCY: [facility] accepted for RN, aide  Permission granted to share info w Relationship: spouse Jolin Kain [id number]     Emotional Assessment Appearance:: Appears stated age Attitude/Demeanor/Rapport: Gracious Affect (typically observed): Accepting Orientation: : Oriented to Self, Oriented to Place, Oriented to  Time, Oriented to Situation Alcohol / Substance Use: Never Used Psych Involvement: No (comment)  Admission diagnosis:  Hyponatremia [E87.1] Syncope [R55] Orthostatic syncope [I95.1] Nephrotic syndrome [N04.9] Patient Active Problem List   Diagnosis Date Noted  Syncope 07/27/2022   Hyponatremia 07/27/2022   Orthostatic syncope 07/27/2022   Chest pain at rest 06/05/2022   Cardiomyopathy ([place]) 05/20/2022   Heart failure with reduced ejection fraction ([place]) 05/18/2022   Chronic anticoagulation 05/18/2022   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 05/18/2022   Prediabetes 12/27/2021   Pure hypercholesterolemia 12/27/2021    Hypothyroidism 12/27/2021   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   Paroxysmal atrial fibrillation ([place]) 07/11/2017   Syncope, vasovagal 07/11/2017   Hypokalemia 07/11/2017   Essential hypertension 07/11/2017   PCP:  Romana Juniper, MD Pharmacy:   [facility] [place], [place] - [address] AT [address] [place] [address] Phone: [phone] Fax: [phone]     Social Determinants of Health ([place]) Social History: [place]: No Food Insecurity (05/19/2022)  Housing: Low Risk  (05/19/2022)  Transportation Needs: No Transportation Needs (05/19/2022)  Utilities: Not At Risk (05/19/2022)  Depression (PHQ2-9): Low Risk  (06/05/2022)  Tobacco Use: Low Risk  (07/26/2022)   SDOH Interventions:     Readmission Risk Interventions    08/01/2022    1:23 PM  Readmission Risk Prevention Plan  Transportation Screening Complete  PCP or Specialist Appt within 5-7 Days Complete  Medication Review (RN CM) Complete

## 2022-08-01 NOTE — Progress Notes (Signed)
Subjective:   No acute events overnight Na up to 138, GFR stable, ALb  <1.5 No AM weights 1.7L UOP, neg 1.5L? Ambulated in halls, feeling stable on feet  Objective Vital signs in last 24 hours: Vitals:   07/31/22 1612 07/31/22 1959 08/01/22 0514 08/01/22 0736  BP: 137/62 137/61 126/67 (!) 155/109  Pulse: 84 90 80 82  Resp: 18 17 17 16   Temp: 98 F (36.7 C) 98.5 F (36.9 C) 98.3 F (36.8 C) 97.7 F (36.5 C)  TempSrc: Oral Oral Oral Oral  SpO2: 100% 100% 99% 100%  Weight:      Height:       Weight change:   Intake/Output Summary (Last 24 hours) at 08/01/2022 0850 Last data filed at 08/01/2022 0845 Gross per 24 hour  Intake 124 ml  Output 1650 ml  Net -1526 ml     Assessment/Plan:  Hypervolemic hyponatremia - due to nephrotic syndrome.  Asymptomatic.  Resolved.  Failed outpatient oral diuresis.  Continue fluid restriction Hypervolemia secondary to nephrotic syndrome: Slowly improving.  Change from IV furosemide to PO Torsemide 100mg  BID today.  Continue low-sodium diet.   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  Continue with ARB   Continue apixaban for DVT prevention.  Syncope - likely orthostatic with use of diuretics and beta-blocker.  Stable blood pressures.  No further recurrence. PT and OT Atrial fibrillation - rate controlled and on Eliquis. Hypoalbuminemia - due to nephrotic syndrome. HTN - stable.   Nutrition - heart healthy diet.  Needs low-sodium and fluid restricted diet Hyperlipidemia on rosuvastatin  [name]    Labs: Basic Metabolic Panel: Recent Labs  Lab 07/30/22 0324 07/30/22 0702 07/30/22 1758 07/31/22 0416 08/01/22 0340  NA 133*   < > 136 135 138  K 4.1  --   --  4.0 3.8  CL 101  --   --  103 102  CO2 26  --   --  25 28  GLUCOSE 113*  --   --  124* 112*  BUN 45*  --   --  55* 61*  CREATININE 0.94  --   --  0.99 0.92  CALCIUM 8.3*  --   --  8.3* 8.4*  PHOS 5.0*  --    --  5.4* 5.7*   < > = values in this interval not displayed.    Liver Function Tests: Recent Labs  Lab 07/26/22 2149 07/27/22 0301 07/30/22 0324 07/31/22 0416 08/01/22 0340  AST 28  --   --   --   --   ALT 18  --   --   --   --   ALKPHOS 44  --   --   --   --   BILITOT 0.8  --   --   --   --   PROT 5.2*  --   --   --   --   ALBUMIN 1.6*   < > 1.6* <1.5* <1.5*   < > = values in this interval not displayed.    No results for input(s): "LIPASE", "AMYLASE" in the last 168 hours. No results for input(s): "AMMONIA" in the last 168 hours. CBC: Recent Labs  Lab 07/26/22 2149  WBC 8.8  NEUTROABS 6.5  HGB 12.3  HCT 36.3  MCV 91.2  PLT 198    Cardiac Enzymes: No results for input(s): "CKTOTAL", "CKMB", "CKMBINDEX", "TROPONINI" in the last 168 hours. CBG: No  results for input(s): "GLUCAP" in the last 168 hours.  Iron Studies: No results for input(s): "IRON", "TIBC", "TRANSFERRIN", "FERRITIN" in the last 72 hours. Studies/Results: No results found. Medications: Infusions:    Scheduled Medications:  apixaban  5 mg Oral BID   feeding supplement  237 mL Oral BID BM   losartan  50 mg Oral Daily   metoprolol succinate  25 mg Oral Daily   multivitamin with minerals  1 tablet Oral Daily   polyethylene glycol  17 g Oral BID   rosuvastatin  10 mg Oral Daily   senna-docusate  1 tablet Oral Daily   torsemide  100 mg Oral BID    have reviewed scheduled and prn medications.  Physical Exam: General: NAD, laying flat in bed Heart: Irregularly irregular, NRMG Lungs: slightly decreased air entry bibasilar, no rales or wheezing, normal WOB Abdomen: non tender, non-distended, no abd wall edema Extremities:  2+ pitting edema to level of thighs    08/01/2022,8:50 AM  LOS: 5 days

## 2022-08-02 LAB — RENAL FUNCTION PANEL
Albumin: <1.5 g/dL — ABNORMAL LOW (ref 3.5–5.0)
Anion gap: 6 (ref 5–15)
BUN: 60 mg/dL — ABNORMAL HIGH (ref 8–23)
CO2: 30 mmol/L (ref 22–32)
Calcium: 8.3 mg/dL — ABNORMAL LOW (ref 8.9–10.3)
Chloride: 103 mmol/L (ref 98–111)
Creatinine, Ser: 0.99 mg/dL (ref 0.44–1.00)
GFR, Estimated: 58 mL/min — ABNORMAL LOW (ref >60)
Glucose, Bld: 110 mg/dL — ABNORMAL HIGH (ref 70–99)
Phosphorus: 5.2 mg/dL — ABNORMAL HIGH (ref 2.5–4.6)
Potassium: 3.4 mmol/L — ABNORMAL LOW (ref 3.5–5.1)
Sodium: 139 mmol/L (ref 135–145)

## 2022-08-02 LAB — MAGNESIUM: Magnesium: 2.2 mg/dL (ref 1.7–2.4)

## 2022-08-02 MED ORDER — POTASSIUM CHLORIDE 20 MEQ PO PACK
40.0000 meq | PACK | Freq: Once | ORAL | Status: AC
  Administered 2022-08-02: 40 meq via ORAL
  Filled 2022-08-02: qty 2

## 2022-08-02 NOTE — Progress Notes (Signed)
Mobility Specialist - Progress Note   08/02/22 0937  Mobility  Activity Ambulated independently in hallway  Level of Assistance Independent  Assistive Device Four wheel walker  Distance Ambulated (ft) 600 ft  Range of Motion/Exercises Active;All extremities  Activity Response Tolerated well  Mobility Referral Yes  $Mobility charge 1 Mobility   Pt was received ambulating in hallway and agreeable to session. No complaints throughout session. Pt was returned to room with all needs met.   Rita Myers  Mobility Specialist Please contact via Solicitor or Rehab office at [phone]

## 2022-08-02 NOTE — Discharge Instructions (Addendum)
To Ms. 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 They were treated IV diuresis and fluid restriction.  They also received a dose of rituximab for their nephrotic syndrome.  They were discharged from the hospital on 08/03/22. I recommend the following after leaving the hospital:  Increase the dose of torsemide to 100 mg twice daily.  Follow-up with nephrology as soon as possible after leaving the hospital.  Continue taking your medications as prescribed.  Limit fluid intake from all sources, including beverages, soups, fruits and vegetables, to less than 2 L/day.  [name] MD 08/03/2022, 2:09 PM

## 2022-08-02 NOTE — Discharge Summary (Addendum)
Name: Rita Myers MRN: [id number] DOB: [birth year] 77 y.o. PCP: Romana Juniper, MD  Date of Admission: 07/26/2022  9:23 PM Date of Discharge: 08/03/2022 3:34 PM Attending Physician: No att. 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   loratadine 10 MG tablet Commonly known as: CLARITIN Take 10 mg by mouth daily.   losartan 100 MG tablet Commonly known as: COZAAR Take 0.5 tablets (50 mg total) by mouth daily. What changed: how much to take   metoprolol succinate 25 MG 24 hr tablet Commonly known as: TOPROL-XL Take 1 tablet (25 mg total) by mouth daily.   potassium chloride SA 20 MEQ tablet Commonly known as: KLOR-CON M Take 1 tablet (20 mEq total) by mouth daily.   rosuvastatin 10 MG tablet Commonly known as: CRESTOR Take 1 tablet (10 mg total) by mouth daily.   torsemide 100 MG tablet Commonly known as: DEMADEX Take 1 tablet (100 mg total) by mouth 2 (two) times daily. What changed:  medication strength how much to take when to take this        Follow-up Appointments:  Follow-up Information     Romana Juniper, MD Follow up.   Why: Please call the clinic and schedule a hospital follow up in the next 7-10 days. Contact information: [place] [address] [phone]         [facility], [facility] Follow up.   Specialty: Home Health Services Why: [facility] will be providing home  health RN and aide for disease management.  They will call you in the next 24-48 hours to set up services. Contact information: [address]  [place] [address] [phone]                 Disposition and follow-up: Rita Myers is a 77 y.o. year old hospitalized for hypervolemic hyponatremia due to nephrotic syndrome.  Nephrotic syndrome Hyponatremia, resolved Sodium within normal limits on discharge.  Rituximab for biopsy-proven nephrotic syndrome.  Continue fluid restricted diet.  Outpatient nephrology follow-up. - Torsemide 100 mg twice daily - Fluid and sodium restricted diet  Syncope and fall Reflex syncope?  No orthostatic changes on admission.  Blood pressure was stable with aggressive diuresis.  Paroxysmal atrial fibrillation Rate controlled and anticoagulated.  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  Referred to ED by outpatient nephrologist.  Admission sodium 120.  Diuresed aggressively with rapid correction of serum sodium.  Diuretics were held, free water administered, upon restarting diuretics sodium came up nicely.  This person had no complications from treatment.  Discharged with sodium within normal limits.  Hyponatremia thought to increase her risk for falls.  Nephrotic syndrome Due to  THSD7A-associated membranous nephropathy.  Status post rituximab 07/28/2022.  Nephrology following inpatient and outpatient.  Syncope and fall Orthostatic with diuretics and beta-blocker versus reflex syncope.  Orthostatic vital signs were tested and normal during admission.  This favors reflex syncope.  Also patient's blood pressure  remained stable with aggressive IV diuresis during admission.  She tolerated walking around the unit well.  She reports history of nervousness and I wonder if these findings favor reflex syncope as well.  Paroxysmal A-fib Rate controlled with metoprolol.  Chronic systolic heart failure Hypervolemia thought to be due to nephrotic syndrome rather than cardiac congestion.  Discharge Exam: Feels well, no shortness of breath, eager for discharge.   Blood pressure (!) 122/59, pulse 95, temperature 97.8 F (36.6 C), resp. rate 14, height 4\' 8"  (1.422 m), weight 57.8 kg, SpO2 100 %.  Comfortable Heart rate normal, rhythm irregularly irregular, no murmurs, left radial pulse 2+ Breathing regular and unlabored on room air, lung sounds clear Skin is warm and dry Lower extremity edema to knees Pleasant, mood and affect concordant  Pertinent studies and procedures: Imaging Orders         CT HEAD WO CONTRAST (5MM)         CT Cervical Spine Wo Contrast         DG Chest Portable 1 View    Lab Orders         CBC with Differential         Comprehensive metabolic panel         Brain natriuretic peptide         Renal function panel         Osmolality         Osmolality, urine         Na and K (sodium & potassium), rand urine         Sodium         Magnesium         Sodium         Sodium         Magnesium         Sodium         Magnesium     Discharge Instructions:   Discharge Instructions      To Ms. 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 They were treated IV diuresis and fluid restriction.   They also received a dose of rituximab for their nephrotic syndrome.  They were discharged from the hospital on 08/03/22. I recommend the following after leaving the hospital:  Increase the dose of torsemide to 100 mg twice daily.  Follow-up with nephrology as soon as possible after leaving the hospital.  Continue taking your medications as prescribed.  Limit fluid intake from all sources, including beverages, soups, fruits and vegetables, to less than 2 L/day.  [name] MD 08/03/2022, 2:09 PM      [name] MD 08/03/2022, 3:34 PM

## 2022-08-02 NOTE — Progress Notes (Signed)
Mobility Specialist - Progress Note   08/02/22 1608  Mobility  Activity Ambulated independently in hallway  Level of Assistance Independent  Assistive Device Four wheel walker  Distance Ambulated (ft) 300 ft  Activity Response Tolerated well  Mobility Referral Yes  $Mobility charge 1 Mobility   Pt was received ambulating hallway and agreeable to session. No complaints throughout. Pt was returned to room with all needs met. Family present.  Franki Monte  Mobility Specialist Please contact via Solicitor or Rehab office at [phone]

## 2022-08-02 NOTE — Progress Notes (Signed)
                 Interval history No new concerns.  Swelling improving.  No shortness of breath.  Good urine output.  Watching fluid intake closely.  Physical exam Blood pressure 134/68, pulse 71, temperature 97.6 F (36.4 C), resp. rate 17, height 4\' 8"  (1.422 m), weight 62 kg, SpO2 100 %.  Comfortable Heart rate normal, rhythm irregularly irregular, no murmurs, left radial pulse 2+ Breathing regular and unlabored on room air, lung sounds clear Skin is warm and dry Lower extremity edema to knees Pleasant, mood and affect concordant  Weight change:    Intake/Output Summary (Last 24 hours) at 08/02/2022 0641 Last data filed at 08/01/2022 1808 Gross per 24 hour  Intake 422 ml  Output 1350 ml  Net -928 ml   Net IO Since Admission: -6,021.48 mL [08/02/22 0641]  Assessment and plan Hospital day Starbuck is a 77 y.o. admitted with hypervolemic hyponatremia which has resolved.  Still volume overloaded from nephrotic syndrome.  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  Continue fluid and sodium restriction.  P.o. torsemide.  Likely home tomorrow. - Daily renal function panel - Torsemide 100 mg p.o. twice daily - Strict I's/O   Acute on Chronic systolic heart failure Cardiomyopathy of unclear etiology.  EF 40 to 45% on echo from January 2024.  No shortness of breath or orthopnea. Will continue home medications for now. Blood pressure stable. - Losartan 50 mg daily - Metoprolol succinate 25 mg daily - Diuretics as above   Atrial fibrillation Rate controlled.  Anticoagulated. - Eliquis 5 mg twice daily   Constipation Resolved. - Scheduled MiraLAX and senna  Diet: Heart healthy, 2 L fluid restriction IVF: None VTE: Therapeutic Eliquis  Code:  Full PT/OT recommendations: No outpatient follow-up TOC recommendations: Following Family Update: At bedside  Discharge plan: Likely tomorrow after another day of diuresis  [name] MD 08/02/2022, 6:41 AM  Pager: [phone] After 5pm or weekend: [phone]

## 2022-08-02 NOTE — Progress Notes (Signed)
Subjective:   No acute events overnight No AM weight recorded, patient states was 132lb Edema present but improved Stable GFR, K 3.4 Torsemide 100 BID, UOP 1.4L and net neg 0.9L yesterday SNa remains normal  Objective Vital signs in last 24 hours: Vitals:   08/01/22 1622 08/01/22 2002 08/02/22 0431 08/02/22 0802  BP: 139/70 (!) 130/53 134/68 (!) 137/59  Pulse: 85 82 71 73  Resp: 14 16 17 16   Temp: 98.4 F (36.9 C) 98.1 F (36.7 C) 97.6 F (36.4 C) (!) 97.5 F (36.4 C)  TempSrc: Oral   Oral  SpO2: 100% 100% 100% 100%  Weight:      Height:       Weight change:   Intake/Output Summary (Last 24 hours) at 08/02/2022 1101 Last data filed at 08/02/2022 0800 Gross per 24 hour  Intake 660 ml  Output 1450 ml  Net -790 ml     Assessment/Plan:  Hypervolemic hyponatremia - due to nephrotic syndrome.  Asymptomatic.  Resolved.  Failed outpatient oral diuresis.  Continue fluid restriction Hypervolemia secondary to nephrotic syndrome: Slowly improving.  Cont PO Torsemide 100mg  BID today.  Continue low-sodium diet.   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  Continue with ARB   Continue apixaban for DVT prevention.  Syncope - likely orthostatic with use of diuretics and beta-blocker.  Stable blood pressures.  No further recurrence. PT and OT.  Ambulating well in halls.  Family remains understandably anxious about another fall at home.  Check orthostatic VS today Atrial fibrillation - rate controlled and on Eliquis. Hypoalbuminemia - due to nephrotic syndrome. HTN - stable.   Nutrition - heart healthy diet.  Needs low-sodium and fluid restricted diet Hyperlipidemia on rosuvastatin  [name]    Labs: Basic Metabolic Panel: Recent Labs  Lab 07/31/22 0416 08/01/22 0340 08/02/22 0320  NA 135 138 139  K 4.0 3.8 3.4*  CL 103 102 103  CO2 25 28 30   GLUCOSE 124* 112* 110*  BUN 55* 61* 60*  CREATININE 0.99 0.92  0.99  CALCIUM 8.3* 8.4* 8.3*  PHOS 5.4* 5.7* 5.2*    Liver Function Tests: Recent Labs  Lab 07/26/22 2149 07/27/22 0301 07/31/22 0416 08/01/22 0340 08/02/22 0320  AST 28  --   --   --   --   ALT 18  --   --   --   --   ALKPHOS 44  --   --   --   --   BILITOT 0.8  --   --   --   --   PROT 5.2*  --   --   --   --   ALBUMIN 1.6*   < > <1.5* <1.5* <1.5*   < > = values in this interval not displayed.    No results for input(s): "LIPASE", "AMYLASE" in the last 168 hours. No results for input(s): "AMMONIA" in the last 168 hours. CBC: Recent Labs  Lab 07/26/22 2149  WBC 8.8  NEUTROABS 6.5  HGB 12.3  HCT 36.3  MCV 91.2  PLT 198    Cardiac Enzymes: No results for input(s): "CKTOTAL", "CKMB", "CKMBINDEX", "TROPONINI" in the last 168 hours. CBG: No results for input(s): "GLUCAP" in the last 168 hours.  Iron Studies: No results for input(s): "IRON", "TIBC", "TRANSFERRIN", "FERRITIN" in the last 72 hours. Studies/Results: No results found. Medications: Infusions:    Scheduled Medications:  apixaban  5 mg Oral BID  feeding supplement  237 mL Oral BID BM   losartan  50 mg Oral Daily   metoprolol succinate  25 mg Oral Daily   multivitamin with minerals  1 tablet Oral Daily   polyethylene glycol  17 g Oral BID   rosuvastatin  10 mg Oral Daily   senna-docusate  1 tablet Oral Daily   torsemide  100 mg Oral BID    have reviewed scheduled and prn medications.  Physical Exam: General: NAD, laying flat in bed Heart: Irregularly irregular, NRMG Lungs: slightly decreased air entry bibasilar, no rales or wheezing, normal WOB Abdomen: non tender, non-distended, no abd wall edema Extremities:  2+ pitting edema to level of thighs    08/02/2022,11:01 AM  LOS: 6 days

## 2022-08-03 ENCOUNTER — Other Ambulatory Visit (HOSPITAL_COMMUNITY): Payer: Self-pay

## 2022-08-03 LAB — RENAL FUNCTION PANEL
Albumin: <1.5 g/dL — ABNORMAL LOW (ref 3.5–5.0)
Anion gap: 7 (ref 5–15)
BUN: 63 mg/dL — ABNORMAL HIGH (ref 8–23)
CO2: 29 mmol/L (ref 22–32)
Calcium: 8.3 mg/dL — ABNORMAL LOW (ref 8.9–10.3)
Chloride: 103 mmol/L (ref 98–111)
Creatinine, Ser: 1.06 mg/dL — ABNORMAL HIGH (ref 0.44–1.00)
GFR, Estimated: 54 mL/min — ABNORMAL LOW (ref >60)
Glucose, Bld: 118 mg/dL — ABNORMAL HIGH (ref 70–99)
Phosphorus: 4.8 mg/dL — ABNORMAL HIGH (ref 2.5–4.6)
Potassium: 4 mmol/L (ref 3.5–5.1)
Sodium: 139 mmol/L (ref 135–145)

## 2022-08-03 MED ORDER — TORSEMIDE 100 MG PO TABS
100.0000 mg | ORAL_TABLET | Freq: Two times a day (BID) | ORAL | 0 refills | Status: DC
  Filled 2022-08-03: qty 60, 30d supply, fill #0

## 2022-08-03 MED ORDER — POTASSIUM CHLORIDE CRYS ER 20 MEQ PO TBCR
20.0000 meq | EXTENDED_RELEASE_TABLET | Freq: Every day | ORAL | 11 refills | Status: DC
  Filled 2022-08-03: qty 30, 30d supply, fill #0

## 2022-08-03 NOTE — Progress Notes (Signed)
Subjective:   No acute events overnight Orthostatics with no change in blood pressure, heart rate did go up.  She felt fairly stable during that time. Labs largely unchanged, K4.0, sodium normal Weights down to 127.5 pounds, edema is improved but still present in the distal legs -1 L yesterday, made 1.6 L with twice daily 100 mg torsemide  Objective Vital signs in last 24 hours: Vitals:   08/03/22 0457 08/03/22 0756 08/03/22 0758 08/03/22 1020  BP: (!) 126/90 (!) 118/57 (!) 122/59   Pulse: 73 89 95   Resp: 17  14   Temp: 98 F (36.7 C) 97.8 F (36.6 C) 97.8 F (36.6 C)   TempSrc: Oral Oral    SpO2:  100% 100%   Weight:    57.8 kg  Height:       Weight change:   Intake/Output Summary (Last 24 hours) at 08/03/2022 1124 Last data filed at 08/03/2022 0809 Gross per 24 hour  Intake 300 ml  Output 1800 ml  Net -1500 ml     Assessment/Plan:  Hypervolemic hyponatremia - due to nephrotic syndrome.  Asymptomatic.  Resolved.  Failed outpatient oral diuresis.  Continue fluid restriction Hypervolemia secondary to nephrotic syndrome: Slowly improving.  Cont PO Torsemide 100mg  BID at discharge.  Continue low-sodium diet.  Reinforced critical importance of daily weights and blood pressures. 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  Continue with ARB   Continue apixaban for DVT prevention.  Syncope -stable with PT, mobility specialist.  Orthostatics negative 3/20.  Discussed critical importance of getting up slowly and carefully at home  atrial fibrillation - rate controlled and on Eliquis. Hypoalbuminemia - due to nephrotic syndrome. HTN - stable.   Nutrition - heart healthy diet.  Needs low-sodium and fluid restricted diet Hyperlipidemia on rosuvastatin  She will follow-up in our office this 3/25 for vital signs and labs.  She has an appointment to see me on 4/2.  Okay for discharge.  [name]    Labs: Basic  Metabolic Panel: Recent Labs  Lab 08/01/22 0340 08/02/22 0320 08/03/22 0317  NA 138 139 139  K 3.8 3.4* 4.0  CL 102 103 103  CO2 28 30 29   GLUCOSE 112* 110* 118*  BUN 61* 60* 63*  CREATININE 0.92 0.99 1.06*  CALCIUM 8.4* 8.3* 8.3*  PHOS 5.7* 5.2* 4.8*    Liver Function Tests: Recent Labs  Lab 08/01/22 0340 08/02/22 0320 08/03/22 0317  ALBUMIN <1.5* <1.5* <1.5*    No results for input(s): "LIPASE", "AMYLASE" in the last 168 hours. No results for input(s): "AMMONIA" in the last 168 hours. CBC: No results for input(s): "WBC", "NEUTROABS", "HGB", "HCT", "MCV", "PLT" in the last 168 hours.  Cardiac Enzymes: No results for input(s): "CKTOTAL", "CKMB", "CKMBINDEX", "TROPONINI" in the last 168 hours. CBG: No results for input(s): "GLUCAP" in the last 168 hours.  Iron Studies: No results for input(s): "IRON", "TIBC", "TRANSFERRIN", "FERRITIN" in the last 72 hours. Studies/Results: No results found. Medications: Infusions:    Scheduled Medications:  apixaban  5 mg Oral BID   feeding supplement  237 mL Oral BID BM   losartan  50 mg Oral Daily   metoprolol succinate  25 mg Oral Daily   multivitamin with minerals  1 tablet Oral Daily   polyethylene glycol  17 g Oral BID   rosuvastatin  10 mg Oral Daily   senna-docusate  1 tablet Oral Daily   torsemide  100 mg Oral BID    have reviewed scheduled and prn medications.  Physical Exam: General: NAD, laying flat in bed Heart: Irregularly irregular, NRMG Lungs: slightly decreased air entry bibasilar, no rales or wheezing, normal WOB Abdomen: non tender, non-distended, no abd wall edema Extremities:  2+ pitting edema to level of thighs    08/03/2022,11:24 AM  LOS: 7 days

## 2022-08-03 NOTE — Progress Notes (Signed)
Mobility Specialist - Progress Note   08/03/22 0959  Mobility  Activity Ambulated independently in hallway  Level of Assistance Independent  Assistive Device Four wheel walker  Distance Ambulated (ft) 600 ft  Activity Response Tolerated well  Mobility Referral Yes  $Mobility charge 1 Mobility   Pt was received at door and agreeable to mobility. No complaints throughout session. Pt was returned to bed with all needs met. Husband present throughout session.   Franki Monte  Mobility Specialist Please contact via Solicitor or Rehab office at [phone]

## 2022-08-07 DIAGNOSIS — N049 Nephrotic syndrome with unspecified morphologic changes: Secondary | ICD-10-CM | POA: Diagnosis not present

## 2022-08-07 DIAGNOSIS — E871 Hypo-osmolality and hyponatremia: Secondary | ICD-10-CM | POA: Diagnosis not present

## 2022-08-07 DIAGNOSIS — N189 Chronic kidney disease, unspecified: Secondary | ICD-10-CM | POA: Diagnosis not present

## 2022-08-07 DIAGNOSIS — I129 Hypertensive chronic kidney disease with stage 1 through stage 4 chronic kidney disease, or unspecified chronic kidney disease: Secondary | ICD-10-CM | POA: Diagnosis not present

## 2022-08-09 ENCOUNTER — Ambulatory Visit (INDEPENDENT_AMBULATORY_CARE_PROVIDER_SITE_OTHER): Payer: Medicare Other | Admitting: Student

## 2022-08-09 ENCOUNTER — Telehealth: Payer: Self-pay

## 2022-08-09 VITALS — BP 138/67 | HR 100 | Temp 98.3°F | Ht <= 58 in | Wt 124.6 lb

## 2022-08-09 DIAGNOSIS — N045 Nephrotic syndrome with diffuse mesangiocapillary glomerulonephritis: Secondary | ICD-10-CM

## 2022-08-09 DIAGNOSIS — R55 Syncope and collapse: Secondary | ICD-10-CM | POA: Diagnosis not present

## 2022-08-09 DIAGNOSIS — E039 Hypothyroidism, unspecified: Secondary | ICD-10-CM

## 2022-08-09 MED ORDER — LEVOTHYROXINE SODIUM 25 MCG PO TABS: 25.0000 ug | ORAL_TABLET | Freq: Every morning | ORAL | 1 refills | Status: DC

## 2022-08-09 MED ORDER — LEVOTHYROXINE SODIUM 25 MCG PO TABS: 25.0000 ug | ORAL_TABLET | Freq: Every morning | ORAL | 3 refills | Status: DC

## 2022-08-09 NOTE — Telephone Encounter (Signed)
Incoming fax from pharmacy  Medication: Levothyroxine  Pharmacy is requesting a 90 day supply instead of 30 day.

## 2022-08-09 NOTE — Patient Instructions (Addendum)
It was a pleasure seeing you in clinic   Thyroid We will check your thyroid labs today and I will call with results  Multivitamin You can try something like one a day or centrum  Syncope It is good that there has not been more falls at home Please make sure you are using a walker  Follow up in 2 months

## 2022-08-09 NOTE — Progress Notes (Unsigned)
Established Patient Office Visit  Subjective   Patient ID: Rita Myers, female    DOB: [birth year]  Age: 77 y.o. MRN: [id number]  Chief Complaint  Patient presents with   Hospitalization Follow-up    Rita Myers is a 77 y.o. person living with a history listed below who presents to clinic for hospital follow-up of hyper bulimic type hyponatremia in the setting of nephrotic syndrome.  Patient interviewed with daughter present.  Daughter served as Optometrist for the patient. Please refer to problem based charting for further details and assessment and plan of current problem and chronic medical conditions.     Patient Active Problem List   Diagnosis Date Noted   Chest pain at rest 06/05/2022   Cardiomyopathy ([place]) 05/20/2022   Heart failure with reduced ejection fraction (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) 122/59  06/29/22 130/80      Physical Exam Constitutional:      Comments: Chronically ill appearing, sitting in chair comfortably  HENT:     Head: Normocephalic and atraumatic.     Mouth/Throat:     Mouth: Mucous membranes are moist.     Pharynx: Oropharynx is clear.  Eyes:     Extraocular Movements: Extraocular movements intact.     Conjunctiva/sclera: Conjunctivae  normal.     Pupils: Pupils are equal, round, and reactive to light.  Cardiovascular:     Rate and Rhythm: Normal rate and regular rhythm.     Pulses: Normal pulses.     Heart sounds: No murmur heard.    Comments: 2+ b/l LE edema Pulmonary:     Effort: Pulmonary effort is normal.     Breath sounds: No rhonchi or rales.  Abdominal:     General: Abdomen is flat. Bowel sounds are normal. There is no distension.     Palpations: Abdomen is soft.     Tenderness: There is no abdominal tenderness.  Musculoskeletal:     Comments: Good strength with sit to stand, wide based gait  Skin:    General: Skin is warm and dry.     Capillary Refill: Capillary refill takes less than 2 seconds.  Neurological:     General: No focal deficit present.     Mental Status: She is alert and oriented to person, place, and time. Mental status is at baseline.     Gait: Gait normal.  Psychiatric:        Mood and Affect: Mood normal.        Behavior: Behavior normal.    Broad based gait Good strengh sitting up  Results for orders placed or performed in visit on 08/09/22  Lab report - scanned  Result Value Ref Range   Creatinine, POC 24.1 mg/dL   Microalb Creat Ratio  [id number]    EGFR [id number]     Last metabolic panel Lab Results  Component Value Date   GLUCOSE 118 (H) 08/03/2022   NA 139 08/03/2022   K 4.0 08/03/2022   CL 103 08/03/2022   CO2 29 08/03/2022   BUN 63 (H) 08/03/2022   CREATININE 1.06 (H) 08/03/2022   GFRNONAA 54 (L) 08/03/2022   CALCIUM 8.3 (L) 08/03/2022   PHOS 4.8 (H) 08/03/2022   PROT 5.2 (L) 07/26/2022   ALBUMIN <1.5 (L) 08/03/2022   LABGLOB 2.8 05/19/2022   AGRATIO 0.6 (L) 05/19/2022   BILITOT 0.8 07/26/2022   ALKPHOS 44 07/26/2022   AST 28 07/26/2022   ALT 18 07/26/2022   ANIONGAP 7 08/03/2022      The 10-year ASCVD risk score (Arnett DK, et al., 2019) is: 54.1%    Assessment & Plan:   Problem List Items Addressed This Visit       Endocrine   Hypothyroidism    Will  continue on current mediation and hold off on TSH today in setting of recent hospitalization. Will get repeat TSH at next visit.       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  She was diuresed and treated with rituximab.  She was discharged on torsemide 100 mg twice daily.  Weight appears to be downtrending from her hospital stay to 124 today.  Had labs performed with nephrology last Friday.  Reports sodium improved to 144 at that time and potassium was normal.  Was told to decrease her torsemide to 100 mg daily.  She does still have some lower extremity swelling but seems to be doing well.  Is wearing compression stockings at nighttime.   Continue torsemide 100 mg daily and potassium 20 mg oral once daily Follow-up with nephrology on 4/2        Other   Syncope, vasovagal    Has had 2 episodes of vasovagal's syncope in the last several months.  Most recently probably prior to her admission on 07/26/2022 after going to the bathroom.  Workup during admission with negative orthostatic vitals.  Patient states she feels stable with when walking with her walker.  BP normotensive today.  Has nursing coming to the home.  No PT recommended for at home.  Recommend she slowly increase her activity when she gets home as she is likely deconditioned.  Discussed this with her daughter who lives nearby.  Daughter will let us know if there is concerns for falling at home with home RN coming by and we will follow-up if she has further PT needs.       Return in about 2 months (around 10/09/2022) for follow up thyroid, with Dr. Altamease Oiler.    Iona Beard, MD

## 2022-08-09 NOTE — Telephone Encounter (Signed)
Will resend

## 2022-08-10 ENCOUNTER — Telehealth: Payer: Self-pay | Admitting: Student

## 2022-08-10 MED ORDER — TORSEMIDE 100 MG PO TABS: 100.0000 mg | ORAL_TABLET | Freq: Every day | ORAL | 0 refills | Status: DC

## 2022-08-10 NOTE — Telephone Encounter (Signed)
Rec'd call from RN with [facility] [phone]. Pt's daughter  refused [facility] for yesterday 08/09/2022.  Pt will be seen on 08/15/2022.

## 2022-08-10 NOTE — Assessment & Plan Note (Signed)
Will continue on current mediation and hold off on TSH today in setting of recent hospitalization. Will get repeat TSH at next visit.

## 2022-08-10 NOTE — Assessment & Plan Note (Signed)
Admission between 3/14 and 08/03/2022 for hypervolemic hyponatremia in the setting of nephrotic syndrome.  She was diuresed and treated with rituximab.  She was discharged on torsemide 100 mg twice daily.  Weight appears to be downtrending from her hospital stay to 124 today.  Had labs performed with nephrology last Friday.  Reports sodium improved to 144 at that time and potassium was normal.  Was told to decrease her torsemide to 100 mg daily.  She does still have some lower extremity swelling but seems to be doing well.  Is wearing compression stockings at nighttime.   Continue torsemide 100 mg daily and potassium 20 mg oral once daily Follow-up with nephrology on 4/2

## 2022-08-10 NOTE — Telephone Encounter (Signed)
Spoke with Daughter. Rescheduled visit to 08/15/2022 so daughter could be present for visit.

## 2022-08-10 NOTE — Assessment & Plan Note (Signed)
Has had 2 episodes of vasovagal's syncope in the last several months.  Most recently probably prior to her admission on 07/26/2022 after going to the bathroom.  Workup during admission with negative orthostatic vitals.  Patient states she feels stable with when walking with her walker.  BP normotensive today.  Has nursing coming to the home.  No PT recommended for at home.  Recommend she slowly increase her activity when she gets home as she is likely deconditioned.  Discussed this with her daughter who lives nearby.  Daughter will let us know if there is concerns for falling at home with home RN coming by and we will follow-up if she has further PT needs.

## 2022-08-15 ENCOUNTER — Telehealth: Payer: Self-pay | Admitting: *Deleted

## 2022-08-15 ENCOUNTER — Telehealth: Payer: Self-pay | Admitting: Nurse Practitioner

## 2022-08-15 ENCOUNTER — Other Ambulatory Visit: Payer: Self-pay

## 2022-08-15 DIAGNOSIS — I10 Essential (primary) hypertension: Secondary | ICD-10-CM

## 2022-08-15 DIAGNOSIS — R809 Proteinuria, unspecified: Secondary | ICD-10-CM | POA: Diagnosis not present

## 2022-08-15 DIAGNOSIS — I5023 Acute on chronic systolic (congestive) heart failure: Secondary | ICD-10-CM | POA: Diagnosis not present

## 2022-08-15 DIAGNOSIS — I080 Rheumatic disorders of both mitral and aortic valves: Secondary | ICD-10-CM | POA: Diagnosis not present

## 2022-08-15 DIAGNOSIS — N049 Nephrotic syndrome with unspecified morphologic changes: Secondary | ICD-10-CM | POA: Diagnosis not present

## 2022-08-15 DIAGNOSIS — N042 Nephrotic syndrome with diffuse membranous glomerulonephritis, unspecified: Secondary | ICD-10-CM | POA: Diagnosis not present

## 2022-08-15 DIAGNOSIS — I272 Pulmonary hypertension, unspecified: Secondary | ICD-10-CM | POA: Diagnosis not present

## 2022-08-15 DIAGNOSIS — I110 Hypertensive heart disease with heart failure: Secondary | ICD-10-CM | POA: Diagnosis not present

## 2022-08-15 DIAGNOSIS — N022 Recurrent and persistent hematuria with diffuse membranous glomerulonephritis: Secondary | ICD-10-CM | POA: Diagnosis not present

## 2022-08-15 MED ORDER — LOSARTAN POTASSIUM 100 MG PO TABS: 100.0000 mg | ORAL_TABLET | Freq: Every day | ORAL | 3 refills | Status: DC

## 2022-08-15 NOTE — Telephone Encounter (Signed)
Received call from Genevive Bi, RN with [facility]. Requesting VO for [place] 1 week 4 to work on disease and med management, and heart failure. Also requesting PT Eval per daughter's request. Verbal auth given. Will route to Yellow Team for agreement/denial.  Patient's daughter has been giving patient Miralax for constipation. Beth wants to ensure this is ok with Provider.

## 2022-08-15 NOTE — Telephone Encounter (Signed)
Agreed. Ok to continue miralax

## 2022-08-15 NOTE — Telephone Encounter (Signed)
  Pt c/o medication issue:  1. Name of Medication: losartan (COZAAR) 100 MG tablet   2. How are you currently taking this medication (dosage and times per day)?   Take 0.5 tablets (50 mg total) by mouth daily.    3. Are you having a reaction (difficulty breathing--STAT)? no  4. What is your medication issue? Beth with [facility] called, she said, pt been taking 100 mg and she would like to verify what is the correct dose the pt should be taking. Pt's BP 148/70 HR 100 irregular

## 2022-08-15 NOTE — Telephone Encounter (Signed)
Losartan was increased at visit on 01/30 with PCP. I was going to send over a message to PCP, but she is out of office. Hospitalization states the same "patient taking differently, taking 100 mg tablet". Dosage was changed from 50 mg to 100 mg at visit with PCP on 01/30- but does not look like sig was changed to reflect the 100 mg.   I did not see anything in hospitalization notes about decreasing.   Patient has been doing 100 mg daily currently.   Thank you for your help!

## 2022-08-15 NOTE — Telephone Encounter (Signed)
Thanks! Medication list updated.

## 2022-08-15 NOTE — Telephone Encounter (Signed)
Called Beth with [facility], and advised that per last note from PCP office: Patient has is being followed by nephrology; increased Losartan from 50 mg to 100 mg daily.    Looks like the signature on the medication wasn't updated, but the dosage was. Just wanted to clarify with you- patient is taking 100 mg of Losartan. I see no other discharge instructions that advised to decrease back down.   I can fix the prescription, but wanted to verify.  Thanks!

## 2022-08-16 NOTE — Telephone Encounter (Signed)
Returned call to Genevive Bi, Therapist, sports with [employer]. No answer. Left detailed message on her self-identified VM that it is ok for patient to continue Miralax.

## 2022-08-16 NOTE — Progress Notes (Signed)
Internal Medicine Clinic Attending ? ?Case discussed with Dr. Liang  At the time of the visit.  We reviewed the resident?s history and exam and pertinent patient test results.  I agree with the assessment, diagnosis, and plan of care documented in the resident?s note. ? ?

## 2022-08-17 ENCOUNTER — Telehealth: Payer: Self-pay

## 2022-08-17 NOTE — Telephone Encounter (Signed)
Lmom to discuss event monitor results. Waiting on a return call.

## 2022-08-22 ENCOUNTER — Telehealth: Payer: Self-pay | Admitting: *Deleted

## 2022-08-22 DIAGNOSIS — I5023 Acute on chronic systolic (congestive) heart failure: Secondary | ICD-10-CM | POA: Diagnosis not present

## 2022-08-22 DIAGNOSIS — I110 Hypertensive heart disease with heart failure: Secondary | ICD-10-CM | POA: Diagnosis not present

## 2022-08-22 DIAGNOSIS — I080 Rheumatic disorders of both mitral and aortic valves: Secondary | ICD-10-CM | POA: Diagnosis not present

## 2022-08-22 DIAGNOSIS — N042 Nephrotic syndrome with diffuse membranous glomerulonephritis, unspecified: Secondary | ICD-10-CM | POA: Diagnosis not present

## 2022-08-22 DIAGNOSIS — I272 Pulmonary hypertension, unspecified: Secondary | ICD-10-CM | POA: Diagnosis not present

## 2022-08-22 NOTE — Telephone Encounter (Signed)
Beth, RN with [facility] called in stating patient was prescribed Ciprodex ear drops for left ear, 4 drops twice per week by ENT for itching. Wants to ensure this is ok with PCP.

## 2022-08-22 NOTE — Telephone Encounter (Signed)
Beth notified of info below. She states if sx continue beyond 2 weeks she will have patient f/u.

## 2022-08-23 ENCOUNTER — Telehealth: Payer: Self-pay

## 2022-08-23 NOTE — Telephone Encounter (Signed)
Spoke with pts daughter. She was notified of pts results. Pt will continue current medication and follow up as planned.

## 2022-08-24 NOTE — Progress Notes (Deleted)
HPI: Follow-up atrial fibrillation, hypertension, mitral valve regurgitation and hyperlipidemia.  Previously followed by Dr. Katrinka Blazing.  Patient was admitted January 2024 with bilateral lower extremity edema.  Last echocardiogram January 2024 showed ejection fraction 40 to 45%, grade 2 diastolic dysfunction, mild left atrial enlargement, small pericardial effusion, mild mitral regurgitation, trace aortic insufficiency.  She was found to have nephrotic syndrome at that time.  She has been treated with rituximab. Subsequently seen with syncopal episode felt possibly secondary to volume depletion/dehydration.Monitor March 2024 showed sinus rhythm with PACs, brief PAT and 5 beats nonsustained ventricular tachycardia.    Current Outpatient Medications  Medication Sig Dispense Refill   apixaban (ELIQUIS) 5 MG TABS tablet Take 1 tablet (5 mg total) by mouth 2 (two) times daily. 60 tablet 11   levothyroxine (SYNTHROID) 25 MCG tablet Take 1 tablet (25 mcg total) by mouth every morning. 90 tablet 3   loratadine (CLARITIN) 10 MG tablet Take 10 mg by mouth daily.     losartan (COZAAR) 100 MG tablet Take 1 tablet (100 mg total) by mouth daily. 90 tablet 3   metoprolol succinate (TOPROL-XL) 25 MG 24 hr tablet Take 1 tablet (25 mg total) by mouth daily. 30 tablet 11   potassium chloride SA (KLOR-CON M) 20 MEQ tablet Take 1 tablet (20 mEq total) by mouth daily. 30 tablet 11   rosuvastatin (CRESTOR) 10 MG tablet Take 1 tablet (10 mg total) by mouth daily. 30 tablet 11   torsemide (DEMADEX) 100 MG tablet Take 1 tablet (100 mg total) by mouth daily. 60 tablet 0   No current facility-administered medications for this visit.     Past Medical History:  Diagnosis Date   Aortic insufficiency    Essential hypertension 07/11/2017   Hyperlipemia    Hypertension    Hypokalemia 07/11/2017   Mitral regurgitation    Nausea, vomiting, and diarrhea 07/11/2017   Paroxysmal A-fib (HCC) 07/11/2017   Pulmonary  hypertension (HCC)    Syncope, vasovagal 07/11/2017    No past surgical history on file.  Social History   Socioeconomic History   Marital status: Married    Spouse name: Not on file   Number of children: Not on file   Years of education: Not on file   Highest education level: Not on file  Occupational History   Not on file  Tobacco Use   Smoking status: Never   Smokeless tobacco: Never  Vaping Use   Vaping Use: Never used  Substance and Sexual Activity   Alcohol use: No   Drug use: No   Sexual activity: Not on file  Other Topics Concern   Not on file  Social History Narrative   Not on file   Social Determinants of Health   Financial Resource Strain: Not on file  Food Insecurity: No Food Insecurity (05/19/2022)   Hunger Vital Sign    Worried About Running Out of Food in the Last Year: Never true    Ran Out of Food in the Last Year: Never true  Transportation Needs: No Transportation Needs (05/19/2022)   PRAPARE - Administrator, Civil Service (Medical): No    Lack of Transportation (Non-Medical): No  Physical Activity: Not on file  Stress: Not on file  Social Connections: Not on file  Intimate Partner Violence: Not At Risk (05/19/2022)   Humiliation, Afraid, Rape, and Kick questionnaire    Fear of Current or Ex-Partner: No    Emotionally Abused: No    Physically  Abused: No    Sexually Abused: No    Family History  Problem Relation Age of Onset   Diabetes Mother    Diabetes Sister    Diabetes Brother     ROS: no fevers or chills, productive cough, hemoptysis, dysphasia, odynophagia, melena, hematochezia, dysuria, hematuria, rash, seizure activity, orthopnea, PND, pedal edema, claudication. Remaining systems are negative.  Physical Exam: Well-developed well-nourished in no acute distress.  Skin is warm and dry.  HEENT is normal.  Neck is supple.  Chest is clear to auscultation with normal expansion.  Cardiovascular exam is regular rate and rhythm.   Abdominal exam nontender or distended. No masses palpated. Extremities show no edema. neuro grossly intact  ECG- personally reviewed  A/P  1 cardiomyopathy with mild LV dysfunction-  2 paroxysmal atrial fibrillation-she is in sinus rhythm-  3 recently diagnosed nephrotic syndrome-managed by nephrology.  4 previous DVT-  5 hypertension-  6 history of syncope-felt secondary to dehydration-  7 history of SVT-continue beta-blocker.  Olga Millers, MD

## 2022-08-25 DIAGNOSIS — I5023 Acute on chronic systolic (congestive) heart failure: Secondary | ICD-10-CM | POA: Diagnosis not present

## 2022-08-25 DIAGNOSIS — I080 Rheumatic disorders of both mitral and aortic valves: Secondary | ICD-10-CM | POA: Diagnosis not present

## 2022-08-25 DIAGNOSIS — N042 Nephrotic syndrome with diffuse membranous glomerulonephritis, unspecified: Secondary | ICD-10-CM | POA: Diagnosis not present

## 2022-08-25 DIAGNOSIS — I272 Pulmonary hypertension, unspecified: Secondary | ICD-10-CM | POA: Diagnosis not present

## 2022-08-25 DIAGNOSIS — I110 Hypertensive heart disease with heart failure: Secondary | ICD-10-CM | POA: Diagnosis not present

## 2022-08-29 DIAGNOSIS — N042 Nephrotic syndrome with diffuse membranous glomerulonephritis, unspecified: Secondary | ICD-10-CM | POA: Diagnosis not present

## 2022-08-29 DIAGNOSIS — I5023 Acute on chronic systolic (congestive) heart failure: Secondary | ICD-10-CM | POA: Diagnosis not present

## 2022-08-29 DIAGNOSIS — I272 Pulmonary hypertension, unspecified: Secondary | ICD-10-CM | POA: Diagnosis not present

## 2022-08-29 DIAGNOSIS — I110 Hypertensive heart disease with heart failure: Secondary | ICD-10-CM | POA: Diagnosis not present

## 2022-08-29 DIAGNOSIS — I080 Rheumatic disorders of both mitral and aortic valves: Secondary | ICD-10-CM | POA: Diagnosis not present

## 2022-08-30 DIAGNOSIS — I5023 Acute on chronic systolic (congestive) heart failure: Secondary | ICD-10-CM | POA: Diagnosis not present

## 2022-08-30 DIAGNOSIS — N042 Nephrotic syndrome with diffuse membranous glomerulonephritis, unspecified: Secondary | ICD-10-CM | POA: Diagnosis not present

## 2022-08-30 DIAGNOSIS — I080 Rheumatic disorders of both mitral and aortic valves: Secondary | ICD-10-CM | POA: Diagnosis not present

## 2022-08-30 DIAGNOSIS — I272 Pulmonary hypertension, unspecified: Secondary | ICD-10-CM | POA: Diagnosis not present

## 2022-08-30 DIAGNOSIS — I110 Hypertensive heart disease with heart failure: Secondary | ICD-10-CM | POA: Diagnosis not present

## 2022-09-02 DIAGNOSIS — H2513 Age-related nuclear cataract, bilateral: Secondary | ICD-10-CM | POA: Diagnosis not present

## 2022-09-03 ENCOUNTER — Emergency Department (HOSPITAL_COMMUNITY)
Admission: EM | Admit: 2022-09-03 | Discharge: 2022-09-03 | Disposition: A | Discharge status: Home / Self Care | Payer: Medicare Other | Attending: Emergency Medicine | Admitting: Emergency Medicine

## 2022-09-03 ENCOUNTER — Other Ambulatory Visit: Payer: Self-pay

## 2022-09-03 ENCOUNTER — Emergency Department (HOSPITAL_COMMUNITY): Payer: Medicare Other

## 2022-09-03 DIAGNOSIS — R739 Hyperglycemia, unspecified: Secondary | ICD-10-CM | POA: Insufficient documentation

## 2022-09-03 DIAGNOSIS — R531 Weakness: Secondary | ICD-10-CM | POA: Diagnosis not present

## 2022-09-03 DIAGNOSIS — I491 Atrial premature depolarization: Secondary | ICD-10-CM | POA: Diagnosis not present

## 2022-09-03 DIAGNOSIS — I10 Essential (primary) hypertension: Secondary | ICD-10-CM | POA: Diagnosis not present

## 2022-09-03 DIAGNOSIS — D649 Anemia, unspecified: Secondary | ICD-10-CM | POA: Diagnosis not present

## 2022-09-03 DIAGNOSIS — Z79899 Other long term (current) drug therapy: Secondary | ICD-10-CM | POA: Insufficient documentation

## 2022-09-03 DIAGNOSIS — R112 Nausea with vomiting, unspecified: Secondary | ICD-10-CM | POA: Insufficient documentation

## 2022-09-03 DIAGNOSIS — R1111 Vomiting without nausea: Secondary | ICD-10-CM | POA: Diagnosis not present

## 2022-09-03 DIAGNOSIS — I959 Hypotension, unspecified: Secondary | ICD-10-CM | POA: Diagnosis not present

## 2022-09-03 DIAGNOSIS — R42 Dizziness and giddiness: Secondary | ICD-10-CM | POA: Diagnosis not present

## 2022-09-03 DIAGNOSIS — R55 Syncope and collapse: Secondary | ICD-10-CM | POA: Diagnosis not present

## 2022-09-03 DIAGNOSIS — R0602 Shortness of breath: Secondary | ICD-10-CM | POA: Diagnosis not present

## 2022-09-03 DIAGNOSIS — R1110 Vomiting, unspecified: Secondary | ICD-10-CM | POA: Diagnosis not present

## 2022-09-03 DIAGNOSIS — Z7901 Long term (current) use of anticoagulants: Secondary | ICD-10-CM | POA: Diagnosis not present

## 2022-09-03 DIAGNOSIS — E039 Hypothyroidism, unspecified: Secondary | ICD-10-CM | POA: Insufficient documentation

## 2022-09-03 LAB — CBC WITH DIFFERENTIAL/PLATELET
Abs Immature Granulocytes: 0.12 10*3/uL — ABNORMAL HIGH (ref 0.00–0.07)
Basophils Absolute: 0 10*3/uL (ref 0.0–0.1)
Basophils Relative: 0 %
Eosinophils Absolute: 0 10*3/uL (ref 0.0–0.5)
Eosinophils Relative: 0 %
HCT: 32.3 % — ABNORMAL LOW (ref 36.0–46.0)
Hemoglobin: 10.7 g/dL — ABNORMAL LOW (ref 12.0–15.0)
Immature Granulocytes: 1 %
Lymphocytes Relative: 23 %
Lymphs Abs: 2 10*3/uL (ref 0.7–4.0)
MCH: 31.7 pg (ref 26.0–34.0)
MCHC: 33.1 g/dL (ref 30.0–36.0)
MCV: 95.6 fL (ref 80.0–100.0)
Monocytes Absolute: 0.8 10*3/uL (ref 0.1–1.0)
Monocytes Relative: 9 %
Neutro Abs: 5.9 10*3/uL (ref 1.7–7.7)
Neutrophils Relative %: 67 %
Platelets: 124 10*3/uL — ABNORMAL LOW (ref 150–400)
RBC: 3.38 MIL/uL — ABNORMAL LOW (ref 3.87–5.11)
RDW: 12.9 % (ref 11.5–15.5)
WBC: 8.9 10*3/uL (ref 4.0–10.5)
nRBC: 0 % (ref 0.0–0.2)

## 2022-09-03 LAB — COMPREHENSIVE METABOLIC PANEL
ALT: 21 U/L (ref 0–44)
AST: 28 U/L (ref 15–41)
Albumin: 2.2 g/dL — ABNORMAL LOW (ref 3.5–5.0)
Alkaline Phosphatase: 36 U/L — ABNORMAL LOW (ref 38–126)
Anion gap: 9 (ref 5–15)
BUN: 64 mg/dL — ABNORMAL HIGH (ref 8–23)
CO2: 24 mmol/L (ref 22–32)
Calcium: 8.1 mg/dL — ABNORMAL LOW (ref 8.9–10.3)
Chloride: 103 mmol/L (ref 98–111)
Creatinine, Ser: 1.18 mg/dL — ABNORMAL HIGH (ref 0.44–1.00)
GFR, Estimated: 47 mL/min — ABNORMAL LOW (ref >60)
Glucose, Bld: 147 mg/dL — ABNORMAL HIGH (ref 70–99)
Potassium: 4.4 mmol/L (ref 3.5–5.1)
Sodium: 136 mmol/L (ref 135–145)
Total Bilirubin: 0.5 mg/dL (ref 0.3–1.2)
Total Protein: 5.5 g/dL — ABNORMAL LOW (ref 6.5–8.1)

## 2022-09-03 LAB — URINALYSIS, W/ REFLEX TO CULTURE (INFECTION SUSPECTED)
Bacteria, UA: NONE SEEN
Bilirubin Urine: NEGATIVE
Glucose, UA: NEGATIVE mg/dL
Ketones, ur: NEGATIVE mg/dL
Leukocytes,Ua: NEGATIVE
Nitrite: NEGATIVE
Protein, ur: >=300 mg/dL — AB
Specific Gravity, Urine: 1.008 (ref 1.005–1.030)
pH: 5 (ref 5.0–8.0)

## 2022-09-03 LAB — CBG MONITORING, ED: Glucose-Capillary: 106 mg/dL — ABNORMAL HIGH (ref 70–99)

## 2022-09-03 LAB — BRAIN NATRIURETIC PEPTIDE: B Natriuretic Peptide: 178 pg/mL — ABNORMAL HIGH (ref 0.0–100.0)

## 2022-09-03 LAB — LIPASE, BLOOD: Lipase: 72 U/L — ABNORMAL HIGH (ref 11–51)

## 2022-09-03 LAB — TROPONIN I (HIGH SENSITIVITY)
Troponin I (High Sensitivity): 7 ng/L (ref <18)
Troponin I (High Sensitivity): 8 ng/L (ref <18)

## 2022-09-03 MED ORDER — ONDANSETRON HCL 4 MG/2ML IJ SOLN: 4.0000 mg | INTRAMUSCULAR | Status: DC | PRN

## 2022-09-03 MED ORDER — SODIUM CHLORIDE 0.9 % IV SOLN: Freq: Once | INTRAVENOUS | Status: AC

## 2022-09-03 MED ORDER — ONDANSETRON 4 MG PO TBDP: 4.0000 mg | ORAL_TABLET | Freq: Three times a day (TID) | ORAL | 0 refills | Status: AC | PRN

## 2022-09-03 NOTE — ED Triage Notes (Signed)
Patient BIB EMS from home witnessed syncope episode. Vomit x2. C/O dizziness. H/O A-fib. 20LAC, Zofran . VSS CBG 184

## 2022-09-03 NOTE — ED Provider Notes (Signed)
[place] EMERGENCY DEPARTMENT AT [facility] Provider Note  CSN: [id number] Arrival date & time: 09/03/22 1435  Chief Complaint(s) Loss of Consciousness  HPI Rita Myers is a 77 y.o. female    The history is provided by the patient.  Loss of Consciousness Episode history:  Single Duration: several minutes. Progression:  Resolved Chronicity:  New Context comment:  N/V Witnessed: yes   Relieved by:  Nothing Worsened by:  Nothing Associated symptoms: nausea, vomiting and weakness (generalized)   Associated symptoms: no chest pain, no confusion, no diaphoresis, no difficulty breathing, no dizziness, no fever, no focal weakness, no headaches, no malaise/fatigue, no recent fall, no seizures and no shortness of breath   Associated symptoms comment:  Also reported seeing various colors just prior to the episode.   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 Authorizing Provider  ondansetron (ZOFRAN-ODT) 4 MG  disintegrating tablet Take 1 tablet (4 mg total) by mouth every 8 (eight) hours as needed for up to 3 days for nausea or vomiting. 09/03/22 09/06/22 Yes Hjalmer Iovino, Amadeo Garnet, MD  apixaban (ELIQUIS) 5 MG TABS tablet Take 1 tablet (5 mg total) by mouth 2 (two) times daily. 06/13/22 06/13/23  Morene Crocker, MD  levothyroxine (SYNTHROID) 25 MCG tablet Take 1 tablet (25 mcg total) by mouth every morning. 08/09/22 08/04/23  Quincy Simmonds, MD  loratadine (CLARITIN) 10 MG tablet Take 10 mg by mouth daily.    [provider]  losartan (COZAAR) 100 MG tablet Take 1 tablet (100 mg total) by mouth daily. 08/15/22 08/15/23  Joylene Grapes, NP  metoprolol succinate (TOPROL-XL) 25 MG 24 hr tablet Take 1 tablet (25 mg total) by mouth daily. 06/13/22 06/13/23  Morene Crocker, MD  potassium chloride SA (KLOR-CON M) 20 MEQ tablet Take 1 tablet (20 mEq total) by mouth daily. 08/03/22 08/03/23  Marrianne Mood, MD  rosuvastatin (CRESTOR) 10 MG tablet Take 1 tablet (10 mg total) by mouth daily. 06/13/22 06/13/23  Morene Crocker, MD  torsemide (DEMADEX) 100 MG tablet Take 1 tablet (100 mg total) by mouth daily. 08/10/22   Quincy Simmonds, MD  Allergies Patient has no known allergies.  Review of Systems Review of Systems  Constitutional:  Negative for diaphoresis, fever and malaise/fatigue.  Respiratory:  Negative for cough and shortness of breath.   Cardiovascular:  Positive for syncope. Negative for chest pain and leg swelling.  Gastrointestinal:  Positive for nausea and vomiting. Negative for abdominal pain, anal bleeding, blood in stool and constipation.  Genitourinary:  Negative for dysuria and frequency.  Neurological:  Positive for weakness (generalized). Negative for dizziness, focal weakness, seizures and headaches.  Psychiatric/Behavioral:  Negative for  confusion.    As noted in HPI  Physical Exam Vital Signs  I have reviewed the triage vital signs BP (!) 111/58   Pulse 67   Temp 98 F (36.7 C)   Resp 18   SpO2 98%   Physical Exam Vitals reviewed.  Constitutional:      General: She is not in acute distress.    Appearance: She is well-developed. She is not diaphoretic.  HENT:     Head: Normocephalic and atraumatic.     Nose: Nose normal.  Eyes:     General: No scleral icterus.       Right eye: No discharge.        Left eye: No discharge.     Conjunctiva/sclera: Conjunctivae normal.     Pupils: Pupils are equal, round, and reactive to light.  Cardiovascular:     Rate and Rhythm: Normal rate and regular rhythm.     Heart sounds: No murmur heard.    No friction rub. No gallop.  Pulmonary:     Effort: Pulmonary effort is normal. No respiratory distress.     Breath sounds: Normal breath sounds. No stridor. No rales.  Abdominal:     General: There is no distension.     Palpations: Abdomen is soft.     Tenderness: There is no abdominal tenderness.  Musculoskeletal:        General: No tenderness.     Cervical back: Normal range of motion and neck supple.  Skin:    General: Skin is warm and dry.     Findings: No erythema or rash.  Neurological:     Mental Status: She is alert and oriented to person, place, and time.     ED Results and Treatments Labs (all labs ordered are listed, but only abnormal results are displayed) Labs Reviewed  CBC WITH DIFFERENTIAL/PLATELET - Abnormal; Notable for the following components:      Result Value   RBC 3.38 (*)    Hemoglobin 10.7 (*)    HCT 32.3 (*)    Platelets 124 (*)    Abs Immature Granulocytes 0.12 (*)    All other components within normal limits  COMPREHENSIVE METABOLIC PANEL - Abnormal; Notable for the following components:   Glucose, Bld 147 (*)    BUN 64 (*)    Creatinine, Ser 1.18 (*)    Calcium 8.1 (*)    Total Protein 5.5 (*)    Albumin 2.2 (*)    Alkaline  Phosphatase 36 (*)    GFR, Estimated 47 (*)    All other components within normal limits  LIPASE, BLOOD - Abnormal; Notable for the following components:   Lipase 72 (*)    All other components within normal limits  URINALYSIS, W/ REFLEX TO CULTURE (INFECTION SUSPECTED) - Abnormal; Notable for the following components:   Hgb urine dipstick SMALL (*)    Protein, ur >=300 (*)    All other components within normal  limits  BRAIN NATRIURETIC PEPTIDE - Abnormal; Notable for the following components:   B Natriuretic Peptide 178.0 (*)    All other components within normal limits  CBG MONITORING, ED - Abnormal; Notable for the following components:   Glucose-Capillary 106 (*)    All other components within normal limits  TROPONIN I (HIGH SENSITIVITY)  TROPONIN I (HIGH SENSITIVITY)                                                                                                                         EKG  EKG Interpretation  Date/Time:  Sunday September 03 2022 14:52:08 EDT Ventricular Rate:  63 PR Interval:  171 QRS Duration: 91 QT Interval:  475 QTC Calculation: 487 R Axis:   11 Text Interpretation: Sinus rhythm Borderline prolonged QT interval Confirmed by Drema Pry [phone]) on 09/03/2022 5:20:38 PM       Radiology DG Chest Port 1 View  Result Date: 09/03/2022 CLINICAL DATA:  Syncope, vomiting, dizziness EXAM: PORTABLE CHEST 1 VIEW COMPARISON:  07/26/2022 FINDINGS: Single frontal view of the chest demonstrates an unremarkable cardiac silhouette. No acute airspace disease, effusion, or pneumothorax. No acute bony abnormality. IMPRESSION: 1. No acute intrathoracic process. Electronically Signed   By: Sharlet Salina M.D.   On: 09/03/2022 15:43    Medications Ordered in ED Medications  ondansetron (ZOFRAN) injection 4 mg (has no administration in time range)  0.9 %  sodium chloride infusion (0 mLs Intravenous Stopped 09/03/22 1932)                                                                                                                                      Procedures Procedures  (including critical care time)  Medical Decision Making / ED Course  Click here for ABCD2, HEART and other calculators  Medical Decision Making Amount and/or Complexity of Data Reviewed Labs: ordered. Radiology: ordered.  Risk Prescription drug management.    This patient presents to the ED for concern of syncope, this involves an extensive number of treatment options, and is a complaint that carries with it a high risk of complications and morbidity. The differential diagnosis includes but not limited to dysrhythmias, blocks, ACS, CHF exacerbation, anemia, electrolyte/metabolic derangements, dehydration, orthostasis, vasovagal, infection.  Initial intervention:  Saline infusion  Work up: [employer] and/or imaging independently interpreted by me) Orthostatics were reassuring EKG without acute ischemic changes, dysrhythmias or blocks.  Troponins negative x 2. BNP less than 180. CBC without leukocytosis.  Mild anemia but close to her baseline. CMP without significant electrolyte derangements.  Stable renal function.  Mild hyperglycemia without DKA. No evidence of bili obstruction or pancreatitis. UA without evidence of infection.  Reassessment: Patient has been able to get up and ambulate without complication.  Able to tolerate p.o.  Has been monitored for several hours in the emergency department without recurrence of her symptoms.       Final Clinical Impression(s) / ED Diagnoses Final diagnoses:  Syncope and collapse   The patient appears reasonably screened and/or stabilized for discharge and I doubt any other medical condition or other [facility] requiring further screening, evaluation, or treatment in the ED at this time. I have discussed the findings, Dx and Tx plan with the patient/family who expressed understanding and agree(s) with the plan. Discharge instructions discussed at  length. The patient/family was given strict return precautions who verbalized understanding of the instructions. No further questions at time of discharge.  Disposition: Discharge  Condition: Good  ED Discharge Orders          Ordered    ondansetron (ZOFRAN-ODT) 4 MG disintegrating tablet  Every 8 hours PRN        09/03/22 2225             Follow Up: Primary care provider  Call  to schedule an appointment for close follow up           This chart was dictated using voice recognition software.  Despite best efforts to proofread,  errors can occur which can change the documentation meaning.    Nira Conn, MD 09/03/22 2228

## 2022-09-05 ENCOUNTER — Telehealth: Payer: Self-pay | Admitting: Cardiology

## 2022-09-05 ENCOUNTER — Telehealth: Payer: Self-pay

## 2022-09-05 ENCOUNTER — Ambulatory Visit: Payer: Medicare Other | Admitting: Cardiology

## 2022-09-05 DIAGNOSIS — I080 Rheumatic disorders of both mitral and aortic valves: Secondary | ICD-10-CM | POA: Diagnosis not present

## 2022-09-05 DIAGNOSIS — I110 Hypertensive heart disease with heart failure: Secondary | ICD-10-CM | POA: Diagnosis not present

## 2022-09-05 DIAGNOSIS — I272 Pulmonary hypertension, unspecified: Secondary | ICD-10-CM | POA: Diagnosis not present

## 2022-09-05 DIAGNOSIS — I5023 Acute on chronic systolic (congestive) heart failure: Secondary | ICD-10-CM | POA: Diagnosis not present

## 2022-09-05 DIAGNOSIS — N042 Nephrotic syndrome with diffuse membranous glomerulonephritis, unspecified: Secondary | ICD-10-CM | POA: Diagnosis not present

## 2022-09-05 NOTE — Telephone Encounter (Signed)
Raford Pitcher  from [facility] [phone] is requesting a call back about pt she stated that the number provided is a secure number and you can leave a message

## 2022-09-05 NOTE — Telephone Encounter (Signed)
RN Beth from [place] was calling about the pt stating the pt was in the er over the weekend for syncope. Pt's labs came back normal. When she went to check on the pt today, Beth stated that the pt's heart rate was very irregular ranging from 60's-104. Beth stated that the patients BP and vitals are normal and she stated that the patient did not express any dizziness. Please advise

## 2022-09-05 NOTE — Telephone Encounter (Signed)
Called to number listed for Duanne Moron voice answers to voicemail.  Left message if this is Beth phone, please call our office.   Call to patient and no answer. LM to call our office

## 2022-09-06 NOTE — Telephone Encounter (Signed)
RTC to [place], Charity fundraiser with [employer].  Message was left that VO for Nurse visits has been approved.

## 2022-09-06 NOTE — Telephone Encounter (Signed)
Call Beth, RN [facility].  Needs 1 time a week for 3 more weeks.  Disease and Med Management . Heart was irregular on yesterday.  Cardiologist was called. Waynetta Sandy can be reached at [phone] to confirm VO.

## 2022-09-06 NOTE — Telephone Encounter (Signed)
RTC to [place] from [place] at [phone]..  Message left that the Clinics had returned her call.

## 2022-09-06 NOTE — Telephone Encounter (Signed)
The number given for the RN is incorrect, spoke with owner of phone/number. Called patient daughter and spoke with her regarding patient.  States patient "seems like she passed out". She had some weakness and then passed out briefly.  Daughter thinks due to not eating meals on time and not drinking. So she did not eat or drink until 3:30 pm , She thinks she was dehydrated.  Daughter states she had had a heart monitor and all was normal. BP was low at the time too.  She states no other episodes. She states she is not pushing things off but feels it was dehydration  She will continue to monitor as well as make sure she is eating and drinking well.  She will also check her BP and pulse  to be sure no issues.   She ask multiple questions regarding pulse, oxygen, and BP.  Advise to monitor BP and pulse. She asked regarding pulse ox as she was told to buy one by PT. Advised she can buy OTC or [employer], this will allow her to check HR without checking the BP each time, but can also check radial /wrist if she is able.  She wants to know what patient pulse and BP should be.  Let her know that it can vary.  If she has BP greater than 160/90 or lower than 90/60 repeatedly, then can give [place] a call. If her pulse is above 100 and she has symptoms of  pounding pulse, dizzy, headache, or other symptoms  , then to call us.  If she has a repeat episode as she states the other day, then she is to call office.  She states understanding of all information and will monitor patient.

## 2022-09-07 DIAGNOSIS — I080 Rheumatic disorders of both mitral and aortic valves: Secondary | ICD-10-CM | POA: Diagnosis not present

## 2022-09-07 DIAGNOSIS — N042 Nephrotic syndrome with diffuse membranous glomerulonephritis, unspecified: Secondary | ICD-10-CM | POA: Diagnosis not present

## 2022-09-07 DIAGNOSIS — I5023 Acute on chronic systolic (congestive) heart failure: Secondary | ICD-10-CM | POA: Diagnosis not present

## 2022-09-07 DIAGNOSIS — I272 Pulmonary hypertension, unspecified: Secondary | ICD-10-CM | POA: Diagnosis not present

## 2022-09-07 DIAGNOSIS — I110 Hypertensive heart disease with heart failure: Secondary | ICD-10-CM | POA: Diagnosis not present

## 2022-09-12 ENCOUNTER — Telehealth: Payer: Self-pay | Admitting: Cardiology

## 2022-09-12 DIAGNOSIS — I110 Hypertensive heart disease with heart failure: Secondary | ICD-10-CM | POA: Diagnosis not present

## 2022-09-12 DIAGNOSIS — I272 Pulmonary hypertension, unspecified: Secondary | ICD-10-CM | POA: Diagnosis not present

## 2022-09-12 DIAGNOSIS — I080 Rheumatic disorders of both mitral and aortic valves: Secondary | ICD-10-CM | POA: Diagnosis not present

## 2022-09-12 DIAGNOSIS — I5023 Acute on chronic systolic (congestive) heart failure: Secondary | ICD-10-CM | POA: Diagnosis not present

## 2022-09-12 DIAGNOSIS — N042 Nephrotic syndrome with diffuse membranous glomerulonephritis, unspecified: Secondary | ICD-10-CM | POA: Diagnosis not present

## 2022-09-12 NOTE — Telephone Encounter (Signed)
I spoke with the patient's daughter; she reports that the patient is not having any symptoms- no sob, no dizziness.   Gave ER precautions and instructed to call if she does develop any s/s. She verbalized understanding.

## 2022-09-12 NOTE — Telephone Encounter (Signed)
Spoke with pt daughter, aware having atrial fib off and on is fine as long as she feels okay. Daughter voiced understanding. She feels the episodes of atrial fib are related to anxiety.

## 2022-09-12 NOTE — Telephone Encounter (Signed)
Patient c/o Palpitations:  High priority if patient c/o lightheadedness, shortness of breath, or chest pain  How long have you had palpitations/irregular HR/ Afib? Are you having the symptoms now?  Beth with [facility] states the patient had an irregular HR today, but she is asymptomatic. She mentions that today will be her last visit with the patient, but patient will be continuing with home health.  Are you currently experiencing lightheadedness, SOB or CP?  No   Do you have a history of afib (atrial fibrillation) or irregular heart rhythm?   Have you checked your BP or HR? (document readings if available):  BP remains consistently in the 120s/60s.HR is 88-92 and stable. Beth mentions that when patient takes her HR, it is in the 60s-70's  Are you experiencing any other symptoms?  No

## 2022-09-15 DIAGNOSIS — I080 Rheumatic disorders of both mitral and aortic valves: Secondary | ICD-10-CM | POA: Diagnosis not present

## 2022-09-15 DIAGNOSIS — I272 Pulmonary hypertension, unspecified: Secondary | ICD-10-CM | POA: Diagnosis not present

## 2022-09-15 DIAGNOSIS — I110 Hypertensive heart disease with heart failure: Secondary | ICD-10-CM | POA: Diagnosis not present

## 2022-09-15 DIAGNOSIS — I5023 Acute on chronic systolic (congestive) heart failure: Secondary | ICD-10-CM | POA: Diagnosis not present

## 2022-09-15 DIAGNOSIS — N042 Nephrotic syndrome with diffuse membranous glomerulonephritis, unspecified: Secondary | ICD-10-CM | POA: Diagnosis not present

## 2022-09-19 DIAGNOSIS — I5023 Acute on chronic systolic (congestive) heart failure: Secondary | ICD-10-CM | POA: Diagnosis not present

## 2022-09-19 DIAGNOSIS — I272 Pulmonary hypertension, unspecified: Secondary | ICD-10-CM | POA: Diagnosis not present

## 2022-09-19 DIAGNOSIS — I080 Rheumatic disorders of both mitral and aortic valves: Secondary | ICD-10-CM | POA: Diagnosis not present

## 2022-09-19 DIAGNOSIS — N042 Nephrotic syndrome with diffuse membranous glomerulonephritis, unspecified: Secondary | ICD-10-CM | POA: Diagnosis not present

## 2022-09-19 DIAGNOSIS — I110 Hypertensive heart disease with heart failure: Secondary | ICD-10-CM | POA: Diagnosis not present

## 2022-09-26 DIAGNOSIS — N022 Recurrent and persistent hematuria with diffuse membranous glomerulonephritis: Secondary | ICD-10-CM | POA: Diagnosis not present

## 2022-09-28 ENCOUNTER — Telehealth: Payer: Self-pay | Admitting: Nurse Practitioner

## 2022-09-28 DIAGNOSIS — N042 Nephrotic syndrome with diffuse membranous glomerulonephritis, unspecified: Secondary | ICD-10-CM

## 2022-09-28 DIAGNOSIS — I10 Essential (primary) hypertension: Secondary | ICD-10-CM

## 2022-09-28 MED ORDER — LOSARTAN POTASSIUM 100 MG PO TABS
100.0000 mg | ORAL_TABLET | Freq: Every day | ORAL | 3 refills | Status: DC
Start: 2022-09-28 — End: 2023-06-26

## 2022-09-28 NOTE — Telephone Encounter (Signed)
Pt c/o medication issue:  1. Name of Medication:   losartan (COZAAR) 100 MG tablet    2. How are you currently taking this medication (dosage and times per day)? As written   3. Are you having a reaction (difficulty breathing--STAT)?   No  4. What is your medication issue? Pt  daughter called in stating pt's pill states for her to take half a tablet. She confirmed pt has been taking this whole once a day. She would like clarification if this is correct and a new rx sent to the pharmacy below.    [id number] [place], [place] - [address] AT [facility] [address] & [facility] [address] Phone: [phone]  Fax: [phone]

## 2022-09-28 NOTE — Telephone Encounter (Signed)
Spoke with pt daughter, she will get her blood pressure log and let us know how it has been running on the 100 mg losartan. She will call back with those numbers.

## 2022-09-28 NOTE — Telephone Encounter (Signed)
Spoke with pt daughter, aware to continue 100 mg losartan daily.

## 2022-09-28 NOTE — Telephone Encounter (Signed)
Daughter returned RN's call. 

## 2022-09-28 NOTE — Telephone Encounter (Signed)
Daughter called to give recent blood pressure readings  Today-140/75 5/15- 128/62 5/14- 140/75 5/13-142/64 5/12-132/65 5/11-138/73

## 2022-09-28 NOTE — Telephone Encounter (Signed)
Patient daughter would like clarification on losartan. It states on Rx for her to take 0.5 tablets (50mg  total) daily.  In the comments it states patient is taking differently she is taking 100mg  daily.  She also states patient home health nurse called last month for clarification  and I see in the notes where her pcp is who increased her losartan to 100mg  daily but Bernadene Person stated it should be ok to take. I informed daughter of the notes and that losartan was increased by PCP.  Daughter states she would like clarification on if patient is supposed to take 50mg  or 100mg  daily from you and is asking for updated script being that patient will be out of her losartan because the dosage changed.

## 2022-10-02 DIAGNOSIS — I10 Essential (primary) hypertension: Secondary | ICD-10-CM | POA: Diagnosis not present

## 2022-10-02 DIAGNOSIS — N022 Recurrent and persistent hematuria with diffuse membranous glomerulonephritis: Secondary | ICD-10-CM | POA: Diagnosis not present

## 2022-10-02 DIAGNOSIS — N049 Nephrotic syndrome with unspecified morphologic changes: Secondary | ICD-10-CM | POA: Diagnosis not present

## 2022-10-02 DIAGNOSIS — R809 Proteinuria, unspecified: Secondary | ICD-10-CM | POA: Diagnosis not present

## 2022-10-10 ENCOUNTER — Ambulatory Visit: Payer: Medicare Other | Attending: Nurse Practitioner | Admitting: Nurse Practitioner

## 2022-10-10 ENCOUNTER — Encounter: Payer: Self-pay | Admitting: Nurse Practitioner

## 2022-10-10 VITALS — BP 132/76 | HR 93 | Ht 59.0 in | Wt 124.6 lb

## 2022-10-10 DIAGNOSIS — I10 Essential (primary) hypertension: Secondary | ICD-10-CM | POA: Diagnosis not present

## 2022-10-10 DIAGNOSIS — E782 Mixed hyperlipidemia: Secondary | ICD-10-CM | POA: Diagnosis not present

## 2022-10-10 DIAGNOSIS — I480 Paroxysmal atrial fibrillation: Secondary | ICD-10-CM

## 2022-10-10 DIAGNOSIS — I429 Cardiomyopathy, unspecified: Secondary | ICD-10-CM | POA: Diagnosis not present

## 2022-10-10 DIAGNOSIS — I340 Nonrheumatic mitral (valve) insufficiency: Secondary | ICD-10-CM

## 2022-10-10 DIAGNOSIS — R079 Chest pain, unspecified: Secondary | ICD-10-CM

## 2022-10-10 DIAGNOSIS — R000 Tachycardia, unspecified: Secondary | ICD-10-CM

## 2022-10-10 DIAGNOSIS — Z87898 Personal history of other specified conditions: Secondary | ICD-10-CM | POA: Diagnosis not present

## 2022-10-10 DIAGNOSIS — N049 Nephrotic syndrome with unspecified morphologic changes: Secondary | ICD-10-CM

## 2022-10-10 DIAGNOSIS — I471 Supraventricular tachycardia, unspecified: Secondary | ICD-10-CM | POA: Diagnosis not present

## 2022-10-10 DIAGNOSIS — I351 Nonrheumatic aortic (valve) insufficiency: Secondary | ICD-10-CM

## 2022-10-10 NOTE — Progress Notes (Signed)
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   Past Medical History    Past Medical History:  Diagnosis Date   Aortic insufficiency    Essential hypertension 07/11/2017   Hyperlipemia    Hypertension    Hypokalemia 07/11/2017   Mitral regurgitation    Nausea, vomiting, and diarrhea 07/11/2017   Paroxysmal A-fib (HCC) 07/11/2017   Pulmonary hypertension (HCC)    Syncope, vasovagal 07/11/2017   No past surgical history on file.  Allergies  No Known Allergies   Labs/Other Studies Reviewed    The following studies were reviewed today:  Cardiac Studies & Procedures       ECHOCARDIOGRAM  ECHOCARDIOGRAM COMPLETE 05/18/2022  Narrative ECHOCARDIOGRAM REPORT    Patient Name:   Rita Myers Date of Exam: 05/18/2022 Medical Rec #:  [id number]   Height:       52.0 in Accession #:    [id number]  Weight:       141.0 lb Date of Birth:  [birth year]  BSA:          1.451 m Patient Age:    77 years    BP:           177/79 mmHg Patient Gender: F           HR:           93 bpm. Exam Location:  Inpatient  Procedure: 2D Echo, Cardiac Doppler and Color Doppler  Indications:    Dyspnea R06.00  History:        Patient has prior history of Echocardiogram examinations, most recent 11/22/2020. Arrythmias:Atrial Fibrillation, Signs/Symptoms:Syncope; Risk Factors:Hypertension, Diabetes and Dyslipidemia.  Sonographer:    Lucendia Herrlich Referring Phys: [id number] STEPHEN RANCOUR  IMPRESSIONS   1.  Left ventricular ejection fraction, by estimation, is 40 to 45%. The left ventricle has mildly decreased function. The left ventricle demonstrates global hypokinesis. Left ventricular diastolic parameters are consistent with Grade II diastolic dysfunction (pseudonormalization). Elevated left atrial pressure. 2. Right ventricular systolic function is normal. The right ventricular size is normal. There is mildly elevated pulmonary artery systolic pressure. The estimated right ventricular systolic pressure is 43.4 mmHg. 3. Left atrial size was mildly dilated. 4. A small pericardial effusion is present. 5. The mitral valve is abnormal. Mild mitral valve regurgitation. Moderate mitral annular calcification. 6. The aortic valve is tricuspid. Aortic valve regurgitation is trivial. Aortic valve sclerosis is present, with no evidence of aortic valve stenosis. 7. The inferior vena cava is normal in size with greater than 50% respiratory variability, suggesting right atrial pressure of 3 mmHg.  FINDINGS Left Ventricle: Left ventricular ejection fraction, by estimation, is 40 to 45%. The left ventricle has mildly decreased function. The left ventricle demonstrates global hypokinesis. The left ventricular internal cavity size was normal in size. There is no left ventricular hypertrophy. Left ventricular diastolic parameters are consistent with Grade II diastolic dysfunction (pseudonormalization). Elevated left atrial pressure.  Right Ventricle: The right ventricular size is normal. No increase in right ventricular wall thickness. Right ventricular systolic function is normal. There is mildly elevated pulmonary artery systolic pressure. The  tricuspid regurgitant velocity is 3.18 m/s, and with an assumed right atrial pressure of 3 mmHg, the estimated right ventricular systolic pressure is 43.4 mmHg.  Left Atrium: Left atrial size was mildly dilated.  Right Atrium: Right atrial size was normal in  size.  Pericardium: A small pericardial effusion is present.  Mitral Valve: The mitral valve is abnormal. Moderate mitral annular calcification. Mild mitral valve regurgitation.  Tricuspid Valve: The tricuspid valve is normal in structure. Tricuspid valve regurgitation is mild.  Aortic Valve: The aortic valve is tricuspid. Aortic valve regurgitation is trivial. Aortic regurgitation PHT measures 660 msec. Aortic valve sclerosis is present, with no evidence of aortic valve stenosis. Aortic valve mean gradient measures 5.0 mmHg. Aortic valve peak gradient measures 9.3 mmHg. Aortic valve area, by VTI measures 1.85 cm.  Pulmonic Valve: The pulmonic valve was grossly normal. Pulmonic valve regurgitation is trivial.  Aorta: The aortic root and ascending aorta are structurally normal, with no evidence of dilitation.  Venous: The inferior vena cava is normal in size with greater than 50% respiratory variability, suggesting right atrial pressure of 3 mmHg.  IAS/Shunts: The interatrial septum was not well visualized.   LEFT VENTRICLE PLAX 2D LVIDd:         5.20 cm   Diastology LVIDs:         4.10 cm   LV e' medial:    4.13 cm/s LV PW:         0.90 cm   LV E/e' medial:  23.9 LV IVS:        0.90 cm   LV e' lateral:   4.90 cm/s LVOT diam:     1.90 cm   LV E/e' lateral: 20.1 LV SV:         53 LV SV Index:   37 LVOT Area:     2.84 cm   RIGHT VENTRICLE             IVC RV S prime:     18.10 cm/s  IVC diam: 1.20 cm TAPSE (M-mode): 3.0 cm  LEFT ATRIUM           Index        RIGHT ATRIUM           Index LA diam:      4.30 cm 2.96 cm/m   RA Area:     13.20 cm LA Vol (A2C): 31.1 ml 21.44 ml/m  RA Volume:   28.30 ml  19.51 ml/m LA Vol (A4C): 73.6 ml 50.74 ml/m AORTIC VALVE AV Area (Vmax):    1.87 cm AV Area (Vmean):   1.90 cm AV Area (VTI):     1.85 cm AV Vmax:           152.50 cm/s AV Vmean:          105.350 cm/s AV VTI:            0.286 m AV Peak Grad:      9.3 mmHg AV Mean Grad:       5.0 mmHg LVOT Vmax:         100.80 cm/s LVOT Vmean:        70.567 cm/s LVOT VTI:          0.187 m LVOT/AV VTI ratio: 0.65 AI PHT:            660 msec  AORTA Ao Root diam: 2.70 cm Ao Asc diam:  2.30 cm  MITRAL VALVE  TRICUSPID VALVE MV Area (PHT): 3.57 cm     TR Peak grad:   40.4 mmHg MV Decel Time: 213 msec     TR Vmax:        318.00 cm/s MR Peak grad: 173.7 mmHg MR Mean grad: 124.0 mmHg    SHUNTS MR Vmax:      659.00 cm/s   Systemic VTI:  0.19 m MR Vmean:     537.0 cm/s    Systemic Diam: 1.90 cm MV E velocity: 98.65 cm/s MV A velocity: 129.50 cm/s MV E/A ratio:  0.76  Epifanio Lesches MD Electronically signed by Epifanio Lesches MD Signature Date/Time: 05/18/2022/3:51:14 PM    Final    MONITORS  CARDIAC EVENT MONITOR 08/14/2022  Narrative Sinus bradycardia, normal sinus rhythm, sinus tachycardia, PACs, brief PAT, 5 beats nonsustained ventricular tachycardia. Olga Millers, MD          Recent Labs: 04/18/2022: NT-Pro BNP 717 05/18/2022: TSH 5.415 08/02/2022: Magnesium 2.2 09/03/2022: ALT 21; B Natriuretic Peptide 178.0; BUN 64; Creatinine, Ser 1.18; Hemoglobin 10.7; Platelets 124; Potassium 4.4; Sodium 136  Recent Lipid Panel    Component Value Date/Time   CHOL 277 (H) 05/19/2022 0055   TRIG 166 (H) 05/19/2022 0055   HDL 77 05/19/2022 0055   CHOLHDL 3.6 05/19/2022 0055   VLDL 33 05/19/2022 0055   LDLCALC 167 (H) 05/19/2022 0055    History of Present Illness    77 year old female with the above past medial history including paroxysmal atrial fibrillation, cardiomyopathy, hypertension, pulmonary hypertension by echo in 2020, 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   Echocardiogram in July 2022 showed normal LV function, G2 DD, mild mitral valve regurgitation, mild aortic insufficiency.  She traveled to [place] in 04/2022 and developed lower extremity edema.  Venous Dopplers  per report showed thrombus in her left lesser saphenous vein. She was treated with a short course of Lasix with no significant improvement. Follow-up venous Dopplers on 04/18/2022 showed no DVT. She presented to the ED on 05/18/2022 with progressive bilateral lower extremity edema that extended into her thighs. Cardiology was consulted. BNP was mildly elevated. She was diuresed with IV Lasix.  Lower extremity duplex was negative for DVT.  Repeat echocardiogram showed EF 40 to 45%, mildly decreased LV function, LV global hypokinesis, G2 DD, normal RV systolic function, mildly elevated PASP, small pericardial effusion, mild mitral valve regurgitation, moderate MAC, trivial aortic valve regurgitation. It was noted that cardiac CTA should be considered in the future to rule out coronary disease given reduced EF.  She did have a run of SVT on telemetry.  She was noted to have hypoalbuminemia and was diagnosed with nephrotic syndrome.  Nephrology was consulted.   It was felt that her lower extremity edema was multifactorial in the setting of nephrotic syndrome, possible acute on chronic systolic heart failure. 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  She was treated with rituximab.  She was evaluated in the ED in 05/2022 in the setting of chest pain.  CT angio of the chest was negative for PE.  Troponin was negative.  She was discharged home with recommendations for consideration of possible outpatient stress test.  She returned to the ED on 06/27/2022 following a syncopal episode.  She denied complete loss of consciousness.  She was hypotensive per EMS.  BP improved with fluid resuscitation.  CT/MRI were negative for acute findings.  Sodium was 126.  It was felt that her symptoms were likely  in the setting of volume depletion, dehydration with combined hyponatremia.  She was advised to follow-up with nephrology.  She was last seen in the office on 06/29/2022 and was stable overall from a  cardiac standpoint.  She denies any recurrent presyncope, syncope.  She did note increased palpitations.  She was hospitalized in March 2024 in the setting of hypervolemic hyponatremia due to nephrotic syndrome following a syncopal episode.  Sodium was 120 on admission.  She was diuresed.  Orthostatics were negative.  30-day event monitor revealed sinus bradycardia, sinus rhythm, sinus tachycardia, PACs, brief PAT, 5 beats of NSVT.  She returned to the ED on 09/03/2022 following another syncopal episode.  Orthostatics were negative.  EKG was without without acute changes.  Troponin was negative x 2. BNP was less than 180. Chest X-ray was unremarkable. She was discharged home in stable condition.   She presents today for follow-up accompanied by her husband. Since her last visit she has been stable from a cardiac standpoint.  She denies any recurrent presyncope or syncope.  She denies any symptoms concerning for angina, denies palpitations, dizziness, edema, PND, orthopnea, weight gain.  Overall, for the past month, she reports feeling well.  Home Medications    Current Outpatient Medications  Medication Sig Dispense Refill   apixaban (ELIQUIS) 5 MG TABS tablet Take 1 tablet (5 mg total) by mouth 2 (two) times daily. 60 tablet 11   levothyroxine (SYNTHROID) 25 MCG tablet Take 1 tablet (25 mcg total) by mouth every morning. 90 tablet 3   loratadine (CLARITIN) 10 MG tablet Take 10 mg by mouth daily.     losartan (COZAAR) 100 MG tablet Take 1 tablet (100 mg total) by mouth daily. 90 tablet 3   metoprolol succinate (TOPROL-XL) 25 MG 24 hr tablet Take 1 tablet (25 mg total) by mouth daily. 30 tablet 11   potassium chloride SA (KLOR-CON M) 20 MEQ tablet Take 1 tablet (20 mEq total) by mouth daily. 30 tablet 11   rosuvastatin (CRESTOR) 10 MG tablet Take 1 tablet (10 mg total) by mouth daily. 30 tablet 11   torsemide (DEMADEX) 100 MG tablet Take 1 tablet (100 mg total) by mouth daily. 60 tablet 0   No current  facility-administered medications for this visit.     Review of Systems    She denies chest pain, palpitations, dyspnea, pnd, orthopnea, n, v, dizziness, syncope, edema, weight gain, or early satiety. All other systems reviewed and are otherwise negative except as noted above.   Physical Exam    VS:  BP 132/76   Pulse 93   Ht 4\' 11"  (1.499 m)   Wt 124 lb 9.6 oz (56.5 kg)   SpO2 99%   BMI 25.17 kg/m   GEN: Well nourished, well developed, in no acute distress. HEENT: normal. Neck: Supple, no JVD, carotid bruits, or masses. Cardiac: IRIR, no murmurs, rubs, or gallops. No clubbing, cyanosis, edema.  Radials/DP/PT 2+ and equal bilaterally.  Respiratory:  Respirations regular and unlabored, clear to auscultation bilaterally. GI: Soft, nontender, nondistended, BS + x 4. MS: no deformity or atrophy. Skin: warm and dry, no rash. Neuro:  Strength and sensation are intact. Psych: Normal affect.  Accessory Clinical Findings    ECG personally reviewed by me today -sinus rhythm 93 bpm, PACs- no acute changes.   Lab Results  Component Value Date   WBC 8.9 09/03/2022   HGB 10.7 (L) 09/03/2022   HCT 32.3 (L) 09/03/2022   MCV 95.6 09/03/2022   PLT  124 (L) 09/03/2022   Lab Results  Component Value Date   CREATININE 1.18 (H) 09/03/2022   BUN 64 (H) 09/03/2022   NA 136 09/03/2022   K 4.4 09/03/2022   CL 103 09/03/2022   CO2 24 09/03/2022   Lab Results  Component Value Date   ALT 21 09/03/2022   AST 28 09/03/2022   ALKPHOS 36 (L) 09/03/2022   BILITOT 0.5 09/03/2022   Lab Results  Component Value Date   CHOL 277 (H) 05/19/2022   HDL 77 05/19/2022   LDLCALC 167 (H) 05/19/2022   TRIG 166 (H) 05/19/2022   CHOLHDL 3.6 05/19/2022    Lab Results  Component Value Date   HGBA1C 6.1 (H) 05/19/2022    Assessment & Plan    1. History of syncope: She has had recurrent syncope-none in the past month. Prior CT/MRI negative for acute findings.  MRA of the neck showed patent carotid  arteries.  Recent echo stable. Syncope most likely occurred in the setting of dehydration, hyponatremia. 30-day event monitor revealed sinus bradycardia, sinus rhythm, sinus tachycardia, PACs, brief PAT, 5 beats of NSVT.   She has declined ischemic evaluation. Discussed ED precautions.  Encouraged adequate hydration, continued follow-up with nephrology.    2. Cardiomyopathy/chest pain: Unclear etiology.  Echo in 05/2022 showed EF 40 to 45%, mildly decreased LV function, LV global hypokinesis, G2 DD, normal RV systolic function, mildly elevated PASP, small pericardial effusion, mild mitral regurgitation, moderate MAC, trivial aortic valve regurgitation. Euvolemic and well compensated on exam.  She did have prior episodes of chest pain. Now stable with no anginal symptoms. We again discussed possible ischemic evaluation with stress testing (she is not a good candidate for coronary CT angiogram given renal dysfunction).  Patient is uncertain whether she would wish to pursue further invasive testing. Will defer ischemic evaluation. Reviewed ED precautions. Continue losartan, metoprolol, torsemide.   3. Paroxysmal atrial fibrillation: Most recent cardiac monitor as above (no evidence of recurrent atrial fibrillation).  She denies any recent palpitations.  Continue Eliquis, metoprolol.   4. SVT/NSVT/tachycardia: Stable. Continue metoprolol.     5. Hypertension: BP well controlled. Continue current antihypertensive regimen.    6. Pulmonary hypertension: Stable on most recent echo.    7. Valvular heart disease: Most recent echo stable with evidence of mild mitral valve regurgitation, moderate MAC, trivial aortic valve regurgitation.    8. Nephrotic syndrome:  She has received rituximab infusions for THSD7A-associated membranous nephropathy.  Following with nephrology.  Continue torsemide per nephrology.   9. Hyperlipidemia: LDL was 167 on 05/19/2022.  Transitioned from pravastatin to Crestor.  Will repeat  fasting lipid panel, LFTs.  Continue Crestor.   10. Disposition: Follow-up in 3-4 months.       Joylene Grapes, NP 10/10/2022, 4:17 PM

## 2022-10-10 NOTE — Patient Instructions (Signed)
Medication Instructions:  Your physician recommends that you continue on your current medications as directed. Please refer to the Current Medication list given to you today.   *If you need a refill on your cardiac medications before your next appointment, please call your pharmacy*   Lab Work: Fasting Lipid panel or LFTs at your convenience.      If you have labs (blood work) drawn today and your tests are completely normal, you will receive your results only by: MyChart Message (if you have MyChart) OR A paper copy in the mail If you have any lab test that is abnormal or we need to change your treatment, we will call you to review the results.   Testing/Procedures: NONE ordered at this time of appointment     Follow-Up: At [facility], you and your health needs are our priority.  As part of our continuing mission to provide you with exceptional heart care, we have created designated Provider Care Teams.  These Care Teams include your primary Cardiologist (physician) and Advanced Practice Providers (APPs -  Physician Assistants and Nurse Practitioners) who all work together to provide you with the care you need, when you need it.  We recommend signing up for the patient portal called "MyChart".  Sign up information is provided on this After Visit Summary.  MyChart is used to connect with patients for Virtual Visits (Telemedicine).  Patients are able to view lab/test results, encounter notes, upcoming appointments, etc.  Non-urgent messages can be sent to your provider as well.   To learn more about what you can do with MyChart, go to [email or web address].    Your next appointment:   3-4 month(s)  Provider:   Olga Millers, MD  or Bernadene Person, NP        Other Instructions

## 2022-10-11 ENCOUNTER — Encounter: Payer: Medicare Other | Admitting: Student

## 2022-10-17 DIAGNOSIS — I429 Cardiomyopathy, unspecified: Secondary | ICD-10-CM | POA: Diagnosis not present

## 2022-10-17 DIAGNOSIS — Z87898 Personal history of other specified conditions: Secondary | ICD-10-CM | POA: Diagnosis not present

## 2022-10-17 DIAGNOSIS — R079 Chest pain, unspecified: Secondary | ICD-10-CM | POA: Diagnosis not present

## 2022-10-17 DIAGNOSIS — I471 Supraventricular tachycardia, unspecified: Secondary | ICD-10-CM | POA: Diagnosis not present

## 2022-10-17 DIAGNOSIS — I480 Paroxysmal atrial fibrillation: Secondary | ICD-10-CM | POA: Diagnosis not present

## 2022-10-17 LAB — LIPID PANEL
Chol/HDL Ratio: 4.1 ratio (ref 0.0–4.4)
Cholesterol, Total: 256 mg/dL — ABNORMAL HIGH (ref 100–199)
HDL: 62 mg/dL (ref >39)
LDL Chol Calc (NIH): 151 mg/dL — ABNORMAL HIGH (ref 0–99)
Triglycerides: 237 mg/dL — ABNORMAL HIGH (ref 0–149)
VLDL Cholesterol Cal: 43 mg/dL — ABNORMAL HIGH (ref 5–40)

## 2022-10-17 LAB — HEPATIC FUNCTION PANEL
ALT: 17 IU/L (ref 0–32)
AST: 27 IU/L (ref 0–40)
Albumin: 3.1 g/dL — ABNORMAL LOW (ref 3.8–4.8)
Alkaline Phosphatase: 44 IU/L (ref 44–121)
Bilirubin Total: <0.2 mg/dL (ref 0.0–1.2)
Bilirubin, Direct: <0.1 mg/dL (ref 0.00–0.40)
Total Protein: 5.2 g/dL — ABNORMAL LOW (ref 6.0–8.5)

## 2022-10-19 ENCOUNTER — Telehealth: Payer: Self-pay

## 2022-10-19 NOTE — Telephone Encounter (Signed)
Lmom to discuss lab results. Waiting on a return call.  

## 2022-10-20 NOTE — Telephone Encounter (Signed)
Lmom to discuss lab results and recommendations further. Waiting on a return call.

## 2022-10-23 ENCOUNTER — Ambulatory Visit (INDEPENDENT_AMBULATORY_CARE_PROVIDER_SITE_OTHER): Payer: Medicare Other | Admitting: Student

## 2022-10-23 ENCOUNTER — Other Ambulatory Visit: Payer: Self-pay

## 2022-10-23 ENCOUNTER — Encounter: Payer: Self-pay | Admitting: Student

## 2022-10-23 VITALS — BP 147/63 | HR 52 | Temp 98.1°F | Ht <= 58 in | Wt 126.6 lb

## 2022-10-23 DIAGNOSIS — I5020 Unspecified systolic (congestive) heart failure: Secondary | ICD-10-CM

## 2022-10-23 DIAGNOSIS — I480 Paroxysmal atrial fibrillation: Secondary | ICD-10-CM

## 2022-10-23 DIAGNOSIS — E039 Hypothyroidism, unspecified: Secondary | ICD-10-CM | POA: Diagnosis not present

## 2022-10-23 DIAGNOSIS — I110 Hypertensive heart disease with heart failure: Secondary | ICD-10-CM

## 2022-10-23 DIAGNOSIS — I10 Essential (primary) hypertension: Secondary | ICD-10-CM

## 2022-10-23 DIAGNOSIS — N045 Nephrotic syndrome with diffuse mesangiocapillary glomerulonephritis: Secondary | ICD-10-CM | POA: Diagnosis not present

## 2022-10-23 DIAGNOSIS — F411 Generalized anxiety disorder: Secondary | ICD-10-CM | POA: Diagnosis not present

## 2022-10-23 DIAGNOSIS — Z139 Encounter for screening, unspecified: Secondary | ICD-10-CM | POA: Insufficient documentation

## 2022-10-23 DIAGNOSIS — F419 Anxiety disorder, unspecified: Secondary | ICD-10-CM

## 2022-10-23 NOTE — Patient Instructions (Addendum)
Thank you, Ms.Derhonda Debruler for allowing [place] to provide your care today. Today we discussed your anxiety and your hypothyroidism.    Referrals: - Care coordination for transportation and medication assistance - Integrated behavioral services   I have ordered the following labs for you:  Lab Orders         TSH       I will call if any are abnormal. All of your labs can be accessed through "My Chart".   My Chart Access: [email or web address]  Please follow-up in: at the same time as your behavioral health appointment    We look forward to seeing you next time. Please call our clinic at [phone] if you have any questions or concerns. The best time to call is Monday-Friday from 9am-4pm, but there is someone available 24/7. If after hours or the weekend, call the main hospital number and ask for the Internal Medicine Resident On-Call. If you need medication refills, please notify your pharmacy one week in advance and they will send [place] a request.   Thank you for letting us take part in your care. Wishing you the best!  Morene Crocker, MD 10/23/2022, 11:05 AM [facility] Internal Medicine Resident, PGY-1

## 2022-10-23 NOTE — Assessment & Plan Note (Signed)
Regular rate on CV exam today. No syncopal episodes or palpitations.  - Continue Meto succinate 25 mg and Eliquis 5 mg BID

## 2022-10-23 NOTE — Assessment & Plan Note (Signed)
Patient has been having difficulty affording her medication co-pays and getting to and from appointments. Discussed the [facility] ACO care coordination coverage a potential benefits. Patient and daughter are interested in referral to care coordination. - Referral to care coordination

## 2022-10-23 NOTE — Progress Notes (Signed)
Subjective:  CC: chronic condition follow up  HPI:  RitaRita Myers is a 77 y.o. female with a past medical history stated below and presents today for chronic condition follow up. Please see problem based assessment and plan for additional details.  Past Medical History:  Diagnosis Date   Aortic insufficiency    Essential hypertension 07/11/2017   Hyperlipemia    Hypertension    Hypokalemia 07/11/2017   Mitral regurgitation    Nausea, vomiting, and diarrhea 07/11/2017   Paroxysmal A-fib (HCC) 07/11/2017   Pulmonary hypertension (HCC)    Syncope, vasovagal 07/11/2017    Current Outpatient Medications on File Prior to Visit  Medication Sig Dispense Refill   apixaban (ELIQUIS) 5 MG TABS tablet Take 1 tablet (5 mg total) by mouth 2 (two) times daily. 60 tablet 11   levothyroxine (SYNTHROID) 25 MCG tablet Take 1 tablet (25 mcg total) by mouth every morning. 90 tablet 3   loratadine (CLARITIN) 10 MG tablet Take 10 mg by mouth daily.     losartan (COZAAR) 100 MG tablet Take 1 tablet (100 mg total) by mouth daily. 90 tablet 3   metoprolol succinate (TOPROL-XL) 25 MG 24 hr tablet Take 1 tablet (25 mg total) by mouth daily. 30 tablet 11   potassium chloride SA (KLOR-CON M) 20 MEQ tablet Take 1 tablet (20 mEq total) by mouth daily. 30 tablet 11   rosuvastatin (CRESTOR) 10 MG tablet Take 1 tablet (10 mg total) by mouth daily. 30 tablet 11   torsemide (DEMADEX) 100 MG tablet Take 1 tablet (100 mg total) by mouth daily. 60 tablet 0   No current facility-administered medications on file prior to visit.    Family History  Problem Relation Age of Onset   Diabetes Mother    Diabetes Sister    Diabetes Brother     Social History   Socioeconomic History   Marital status: Married    Spouse name: Not on file   Number of children: Not on file   Years of education: Not on file   Highest education level: Not on file  Occupational History   Not on file  Tobacco Use   Smoking status:  Never   Smokeless tobacco: Never  Vaping Use   Vaping Use: Never used  Substance and Sexual Activity   Alcohol use: No   Drug use: No   Sexual activity: Not on file  Other Topics Concern   Not on file  Social History Narrative   Not on file   Social Determinants of Health   Financial Resource Strain: Not on file  Food Insecurity: No Food Insecurity (05/19/2022)   Hunger Vital Sign    Worried About Running Out of Food in the Last Year: Never true    Ran Out of Food in the Last Year: Never true  Transportation Needs: No Transportation Needs (05/19/2022)   PRAPARE - Administrator, Civil Service (Medical): No    Lack of Transportation (Non-Medical): No  Physical Activity: Not on file  Stress: Not on file  Social Connections: Not on file  Intimate Partner Violence: Not At Risk (05/19/2022)   Humiliation, Afraid, Rape, and Kick questionnaire    Fear of Current or Ex-Partner: No    Emotionally Abused: No    Physically Abused: No    Sexually Abused: No    Review of Systems: ROS negative except for what is noted on the assessment and plan.  Objective:   Vitals:   10/23/22 1016  10/23/22 1118  BP: (!) 150/56 (!) 147/63  Pulse: (!) 52   Temp: 98.1 F (36.7 C)   TempSrc: Oral   SpO2: 100%   Weight: 126 lb 9.6 oz (57.4 kg)   Height: 4\' 8"  (1.422 m)     Physical Exam: Constitutional: well-appearing elderly woman sitting in chair, in no acute distress HENT: normocephalic atraumatic, mucous membranes moist Eyes: conjunctiva non-erythematous Neck: supple Cardiovascular: regular rate and rhythm, no m/r/g, symmetric and 2+ Radial, PT, and DP pulses Pulmonary/Chest: normal work of breathing on room air, lungs clear to auscultation bilaterally Abdominal: soft, non-tender, non-distended MSK: normal bulk and tone, no lower extremity edema Neurological: alert & oriented x 3, 5/5 strength in bilateral upper and lower extremities, normal gait Skin: warm and dry Psych:  anxious mood and affect       10/23/2022   11:19 AM  Depression screen PHQ 2/9  Decreased Interest 0  Down, Depressed, Hopeless 0  PHQ - 2 Score 0  Altered sleeping 0  Tired, decreased energy 0  Change in appetite 0  Feeling bad or failure about yourself  0  Trouble concentrating 0  Suicidal thoughts 0  PHQ-9 Score 0       10/23/2022   11:19 AM  GAD 7 : Generalized Anxiety Score  Nervous, Anxious, on Edge 3  Control/stop worrying 3  Worry too much - different things 3  Trouble relaxing 3  Restless 0  Easily annoyed or irritable 0  Afraid - awful might happen 3  Total GAD 7 Score 15  Anxiety Difficulty Very difficult     Assessment & Plan:   Heart failure with reduced ejection fraction (HCC) No recent episodes of chest pain, shortness of breath, changes in activity. Has been more active. Patient is euvolemic today. Weight 126 compared to 123lb at last [facility] office visit in 09/2022. No episodes of hypovolemia/hypotension in the past two months.  Continue - Losartan 100 mg daily - Metoprolol - Kcloride   Paroxysmal atrial fibrillation (HCC) Regular rate on CV exam today. No syncopal episodes or palpitations.  - Continue Meto succinate 25 mg and Eliquis 5 mg BID   Essential hypertension Mildly hypertensive today but I do see home log with BP <130/80. Patient took medications this AM but we have been discussing difficult topics about internal home dynamics that have been driving patient's stress. I suspect this is likely contributing. 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   Reviewed recent records from [facility]. Patient with  multiple hospitalizations for hypovolemia and hyponatremia in the setting of nephrotic syndrome. Patient has been compliant with low-sodium diet and fluid restriction.   10/04/2022  labs ACR 11236Cr 0.8, GFR 75. BUN 54 - likely in the setting of high diuretic dose. otherwise normal BMP and LFTs.  Hgb 11 MCV 95 PLT 179 - No changes to current medications - Patient to follow with Dr. Marisue Humble - Euvolemic today  Hypothyroidism  Last Tsh 5.415 5 months ago . Currently on Synthroid. Pt needs refill. Will check TSH today and adjust dosing accordingly - TSH today   Anxiety    10/23/2022   11:19 AM  GAD 7 : Generalized Anxiety Score  Nervous, Anxious, on  Edge 3  Control/stop worrying 3  Worry too much - different things 3  Trouble relaxing 3  Restless 0  Easily annoyed or irritable 0  Afraid - awful might happen 3  Total GAD 7 Score 15  Anxiety Difficulty Very difficult    Patient and her daughter revealed that patient's anxiety has been heightened in the past few months. Initially triggered by new diagnoses, multiple chronic medical conditions, need for frequent clinic visits and hospitalizations, as well as at-home interfamily conflict. Patient feels alone and frustrated as patient's husbands has not been helpful and can be controlling when making medical decisions. This clashes with the patient's and daughters wishes, creating more contention and anxiety. Patient does not feel threatened and is physically safe at home. There is close interaction from her daughter, who lives close by.  With a GAD-7 score of 15, making her life very difficult, patient would benefit from interventions. Patient would like to try in-person CBT, as when she is at home she does not have privacy to share freely.  - Referral to behavioral services at [facility]  Encounter for screening involving social determinants of health [facility]) Patient has been having difficulty affording her medication co-pays and getting to  and from appointments. Discussed the [facility] ACO care coordination coverage a potential benefits. Patient and daughter are interested in referral to care coordination. - Referral to care coordination    Return for Chronic condition follow up at the same time she is seen with Christen Butter.  Patient discussed with Dr. Burna Forts, MD [facility] Internal Medicine Program - PGY-1 10/23/2022, 5:25 PM

## 2022-10-23 NOTE — Assessment & Plan Note (Signed)
Last Tsh 5.415 5 months ago . Currently on Synthroid. Pt needs refill. Will check TSH today and adjust dosing accordingly - TSH today

## 2022-10-23 NOTE — Assessment & Plan Note (Signed)
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   Reviewed recent records from [facility]. Patient with multiple hospitalizations for hypovolemia and hyponatremia in the setting of nephrotic syndrome. Patient has been compliant with low-sodium diet and fluid restriction.   10/04/2022  labs ACR 11236Cr 0.8, GFR 75. BUN 54 - likely in the setting of high diuretic dose. otherwise normal BMP and LFTs.  Hgb 11 MCV 95 PLT 179 - No changes to current medications - Patient to follow with Dr. Marisue Humble - Euvolemic today

## 2022-10-23 NOTE — Assessment & Plan Note (Addendum)
No recent episodes of chest pain, shortness of breath, changes in activity. Has been more active. Patient is euvolemic today. Weight 126 compared to 123lb at last [facility] office visit in 09/2022. No episodes of hypovolemia/hypotension in the past two months.  Continue - Losartan 100 mg daily - Metoprolol - Kcloride 

## 2022-10-23 NOTE — Assessment & Plan Note (Addendum)
    10/23/2022   11:19 AM  GAD 7 : Generalized Anxiety Score  Nervous, Anxious, on Edge 3  Control/stop worrying 3  Worry too much - different things 3  Trouble relaxing 3  Restless 0  Easily annoyed or irritable 0  Afraid - awful might happen 3  Total GAD 7 Score 15  Anxiety Difficulty Very difficult    Patient and her daughter revealed that patient's anxiety has been heightened in the past few months. Initially triggered by new diagnoses, multiple chronic medical conditions, need for frequent clinic visits and hospitalizations, as well as at-home interfamily conflict. Patient feels alone and frustrated as patient's husbands has not been helpful and can be controlling when making medical decisions. This clashes with the patient's and daughters wishes, creating more contention and anxiety. Patient does not feel threatened and is physically safe at home. There is close interaction from her daughter, who lives close by.  With a GAD-7 score of 15, making her life very difficult, patient would benefit from interventions. Patient would like to try in-person CBT, as when she is at home she does not have privacy to share freely.  - Referral to behavioral services at [facility]

## 2022-10-23 NOTE — Assessment & Plan Note (Signed)
Mildly hypertensive today but I do see home log with BP <130/80. Patient took medications this AM but we have been discussing difficult topics about internal home dynamics that have been driving patient's stress. I suspect this is likely contributing. - Continue - Torsemide 100 mg AM - Losartan 100 mg daily - Metoprolol - Kcloride - Instructed patient to share with nephrology at next follow up if consistently elevated

## 2022-10-24 LAB — TSH: TSH: 3.99 u[IU]/mL (ref 0.450–4.500)

## 2022-10-24 NOTE — Progress Notes (Signed)
Internal Medicine Clinic Attending  Case discussed with Dr. Gomez-Caraballo  At the time of the visit.  We reviewed the resident's history and exam and pertinent patient test results.  I agree with the assessment, diagnosis, and plan of care documented in the resident's note.  

## 2022-10-25 ENCOUNTER — Telehealth: Payer: Self-pay | Admitting: *Deleted

## 2022-10-25 ENCOUNTER — Telehealth: Payer: Self-pay

## 2022-10-25 NOTE — Progress Notes (Signed)
  Care Coordination   Note   10/25/2022 Name: [name] MRN: [id number] DOB: [birth year]  Rita Myers is a 77 y.o. year old female who sees Morene Crocker, MD for primary care. I reached out to [employer] by phone today to offer care coordination services.  Ms. Outland was given information about Care Coordination services today including:   The Care Coordination services include support from the care team which includes your Nurse Coordinator, Clinical Social Worker, or Pharmacist.  The Care Coordination team is here to help remove barriers to the health concerns and goals most important to you. Care Coordination services are voluntary, and the patient may decline or stop services at any time by request to their care team member.   Care Coordination Consent Status: Patient daughter Valetta Close DPR on file agreed to services and verbal consent obtained.   Follow up plan:  Telephone appointment with care coordination team member scheduled for:  10/26/22  Encounter Outcome:  Pt. Scheduled  [facility] Coordination Care Guide  Direct Dial: [phone]

## 2022-10-25 NOTE — Progress Notes (Signed)
   Care Guide Note  10/25/2022 Name: Rita Myers MRN: [id number] DOB: 09/13/1945  Referred by: Morene Crocker, MD Reason for referral : Care Coordination (Outreach to schedule with Pharm d )   Rita Myers is a 77 y.o. year old female who is a primary care patient of Morene Crocker, MD. Rita Myers was referred to the pharmacist for assistance related to CHF and HTN.    An unsuccessful telephone outreach was attempted today to contact the patient who was referred to the pharmacy team for assistance with medication assistance. Additional attempts will be made to contact the patient.   Rita Myers, RMA Care Guide [facility]  [place], [place] [address] Direct Dial: [phone] [email or web address]

## 2022-10-26 ENCOUNTER — Ambulatory Visit: Payer: Self-pay | Admitting: *Deleted

## 2022-10-26 ENCOUNTER — Other Ambulatory Visit: Payer: Self-pay | Admitting: Student

## 2022-10-26 DIAGNOSIS — E039 Hypothyroidism, unspecified: Secondary | ICD-10-CM

## 2022-10-26 MED ORDER — LEVOTHYROXINE SODIUM 25 MCG PO TABS
25.0000 ug | ORAL_TABLET | Freq: Every morning | ORAL | 3 refills | Status: AC
Start: 2022-10-26 — End: 2023-10-21

## 2022-10-26 NOTE — Patient Outreach (Addendum)
  Care Coordination   Follow Up Visit Note   10/26/2022 Name: Rita Myers MRN: [id number] DOB: [birth year]  Rita Myers is a 77 y.o. year old female who sees Morene Crocker, MD for primary care. I spoke with  Teka Goyal by phone today.  What matters to the patients health and wellness today?  Daughter expresses concerns for pt's anxiety (worsened recently) and wants to seek counseling for her mom who is agreeable- "someone to talk to".   Caregiver stress causing worsened anxiety due to pt's husband's needs (daughter indicates he has OCD and probable Aspergers which is exacerbating pt's anxiety)    Goals Addressed             This Visit's Progress    Link patient with support to reduce anxiety       Activities and task to complete in order to accomplish goals.   PCP has placed referral to [facility] for in-person counseling support- expect phone call to schedule Start / continue relaxed breathing 3 times daily Keep all upcoming appointments discussed today Continue with compliance of taking medication prescribed by Doctor         SDOH assessments and interventions completed:  Yes  SDOH Interventions Today    Flowsheet Row Most Recent Value  SDOH Interventions   Food Insecurity Interventions Intervention Not Indicated  Housing Interventions Intervention Not Indicated  Transportation Interventions Intervention Not Indicated, Other (Comment)  [family aware of transportation through insurance as well [facility]]  Utilities Interventions Intervention Not Indicated  Alcohol Usage Interventions Intervention Not Indicated (Score <7)  Financial Strain Interventions Intervention Not Indicated  Stress Interventions Other (Comment)  [pt has some stress/stressors and hasbeen referred to [facility] for support]        Care Coordination Interventions:  Yes, provided  Interventions Today    Flowsheet Row Most Recent Value  Chronic Disease   Chronic disease during today's visit Other  [Anxiety-  daughter reports pt has chronic GAD which has been exacerabted by stressors at home- caregiving, etc.]  General Interventions   General Interventions Discussed/Reviewed General Interventions Discussed  Mental Health Interventions   Mental Health Discussed/Reviewed Mental Health Discussed, Anxiety  [Reported by daughter and wants to get her counseling]  Pharmacy Interventions   Pharmacy Dicussed/Reviewed Pharmacy Topics Discussed, Affording Medications, Referral to Pharmacist  [Daughter asking about "bubble pack" or other organized system to aide in adherence/accuracy and asks for Pharmacy to reach out to her]  Referral to Pharmacist --  [Daughter reports concern for the expense of RX's for pt and husband- wants to discuss further with Pharmacy- CSW will communicate with Pharmact team]       Follow up plan: Follow up call scheduled for 11/21/22    Encounter Outcome:  Pt. Visit Completed

## 2022-10-27 NOTE — Patient Instructions (Signed)
Visit Information  Thank you for taking time to visit with me today. Please don't hesitate to contact me if I can be of assistance to you.   Following are the goals we discussed today:   Goals Addressed             This Visit's Progress    Link patient with support to reduce anxiety       Activities and task to complete in order to accomplish goals.   PCP has placed referral to [facility] for in-person counseling support- expect phone call to schedule Start / continue relaxed breathing 3 times daily Keep all upcoming appointments discussed today Continue with compliance of taking medication prescribed by Doctor         Our next appointment is by telephone on 11/21/22  Please call the care guide team at [phone] if you need to cancel or reschedule your appointment.   If you are experiencing a Mental Health or Behavioral Health Crisis or need someone to talk to, please call the Suicide and Crisis Lifeline: 988 call 911   The patient verbalized understanding of instructions, educational materials, and care plan provided today and DECLINED offer to receive copy of patient instructions, educational materials, and care plan.   Telephone follow up appointment with care management team member scheduled for: 11/21/22  Reece Levy, MSW, LCSW Clinical Social Worker [facility] [phone]

## 2022-11-01 NOTE — Progress Notes (Signed)
   Care Guide Note  11/01/2022 Name: Rita Myers MRN: [id number] DOB: [birth year]  Referred by: Morene Crocker, MD Reason for referral : Care Coordination (Outreach to schedule with Pharm d )   Rita Myers is a 77 y.o. year old female who is a primary care patient of Morene Crocker, MD. Rita Myers was referred to the pharmacist for assistance related to Atrial Fibrillation.    Successful contact was made with the patient to discuss pharmacy services including being ready for the pharmacist to call at least 5 minutes before the scheduled appointment time, to have medication bottles and any blood sugar or blood pressure readings ready for review. The patient agreed to meet with the pharmacist via with the pharmacist via telephone visit on (date/time).  11/13/2022  Rita Myers, RMA Care Guide [facility]  [place], [place] [address] Direct Dial: [phone] [email or web address]

## 2022-11-03 ENCOUNTER — Ambulatory Visit: Payer: Self-pay | Admitting: *Deleted

## 2022-11-03 NOTE — Patient Outreach (Addendum)
  Care Coordination   Follow Up Visit Note   11/03/2022 Name: Rita Myers MRN: [id number] DOB: 13-Feb-1946  Rita Myers is a 77 y.o. year old female who sees Morene Crocker, MD for primary care. I  spoke with pt's daughter, [name], by phone on 11/02/22  What matters to the patients health and wellness today?  Daughter feels pt needs an in-person opportunity to discuss her anxiety and caregiver stress. Requesting this CSW attempt to meet with pt in PCP office at time of next PCP visit (daughter awaiting callback to schedule).   Goals Addressed             This Visit's Progress    Link patient with support to reduce anxiety       Activities and task to complete in order to accomplish goals.   Consider in-person visit with a counselor as discussed (can meet with myself at office) and explore options in the community if desired Start / continue relaxed breathing 3 times daily Keep all upcoming appointments   Continue with compliance of taking medication prescribed by Doctor         SDOH assessments and interventions completed:  Yes     Care Coordination Interventions:  Yes, provided  Interventions Today    Flowsheet Row Most Recent Value  Chronic Disease   Chronic disease during today's visit Other  [anxiety/caregiver stress reported by daughter]  General Interventions   General Interventions Discussed/Reviewed Community Resources  Mental Health Interventions   Mental Health Discussed/Reviewed Mental Health Discussed, Anxiety, Coping Strategies, Other  [Discussed with pt's daughter by phone and offered to meet with pt at PCP office to assess in-person and assist/support and refer to community based Counselor if desired for long term treatment/support.]       Follow up plan: Follow up call scheduled for 12/04/22    Encounter Outcome:  Pt. Visit Completed

## 2022-11-03 NOTE — Patient Instructions (Signed)
Visit Information  Thank you for taking time to visit with me today. Please don't hesitate to contact me if I can be of assistance to you.   Following are the goals we discussed today:   Goals Addressed             This Visit's Progress    Link patient with support to reduce anxiety       Activities and task to complete in order to accomplish goals.   Consider in-person visit with a counselor as discussed (can meet with myself at office) and explore options in the community if desired Start / continue relaxed breathing 3 times daily Keep all upcoming appointments   Continue with compliance of taking medication prescribed by Doctor         Our next appointment is by telephone on 12/04/22   Please call the care guide team at [phone] if you need to cancel or reschedule your appointment.   If you are experiencing a Mental Health or Behavioral Health Crisis or need someone to talk to, please call the Suicide and Crisis Lifeline: 988 call 911   The patient verbalized understanding of instructions, educational materials, and care plan provided today and DECLINED offer to receive copy of patient instructions, educational materials, and care plan.   Telephone follow up appointment with care management team member scheduled for:12/04/22  Reece Levy, MSW, LCSW Clinical Social Worker [employer] [employer] [facility] [phone]

## 2022-11-07 ENCOUNTER — Ambulatory Visit: Payer: Self-pay | Admitting: *Deleted

## 2022-11-07 NOTE — Patient Instructions (Signed)
Visit Information  Thank you for taking time to visit with me today. Please don't hesitate to contact me if I can be of assistance to you.   Following are the goals we discussed today:   Goals Addressed             This Visit's Progress    Link patient with support to reduce anxiety       Activities and task to complete in order to accomplish goals.   Consider in-person visit with a counselor as discussed (can meet with myself at office) and explore options in the community if desired Start / continue relaxed breathing 3 times daily Keep all upcoming appointments   Continue with compliance of taking medication prescribed by Doctor  Consider therapeutic counseling referral- let me know if this is wanted        Our next appointment is by telephone on 12/04/22  Please call the care guide team at [phone] if you need to cancel or reschedule your appointment.   If you are experiencing a Mental Health or Behavioral Health Crisis or need someone to talk to, please call the Suicide and Crisis Lifeline: 988 call 911   The patient verbalized understanding of instructions, educational materials, and care plan provided today and DECLINED offer to receive copy of patient instructions, educational materials, and care plan.   Telephone follow up appointment with care management team member scheduled for:12/04/22  .Reece Levy, MSW, LCSW Clinical Social Worker [employer] [employer] [facility] [phone]

## 2022-11-07 NOTE — Patient Outreach (Signed)
  Care Coordination   Follow Up Visit Note   11/07/2022 Name: Rita Myers MRN: [id number] DOB: 05-19-1945  Rita Myers is a 77 y.o. year old female who sees Morene Crocker, MD for primary care. I  spoke with pt's daughter, Rita Myers, by phone today.  What matters to the patients health and wellness today?  CSW called daughter to further discuss arranging for a time for this CSW to meet pt at PCP office (soon) to offer face to face conversation about her anxiety, needs and to offer additional support and resources to aide her in reducing her anxiety. Daughter declines; voicing appreciation for our concerns, and does not feel this is needed. Will plan to reach back out next month to discuss/assess further. Daughter provided with CSW # and will call if needs change or arise.     Goals Addressed             This Visit's Progress    Link patient with support to reduce anxiety       Activities and task to complete in order to accomplish goals.   Consider in-person visit with a counselor as discussed (can meet with myself at office) and explore options in the community if desired Start / continue relaxed breathing 3 times daily Keep all upcoming appointments   Continue with compliance of taking medication prescribed by Doctor  Consider therapeutic counseling referral- let me know if this is wanted        SDOH assessments and interventions completed:  Yes     Care Coordination Interventions:  Yes, provided  Interventions Today    Flowsheet Row Most Recent Value  Chronic Disease   Chronic disease during today's visit Other  [per daughter, "undiagnosed GAD"]  General Interventions   General Interventions Discussed/Reviewed [facility]  [CSW offered again to daughter to provide resources for caregiver support groups, senior activities, etc but she indicates pt will not agree]  Mental Health Interventions   Mental Health Discussed/Reviewed Other, Anxiety  [Daughter feels  her mother has undiagnosed GAD- the spouse causes this to escalate at times. Daughter indicates pt will not agree to RX, counseling,  nor does she feel a visit with me is warranted. Daughter reports pt is "500% safe" just overly "worked up"]  Software engineer Discussed, Financial risk analyst  [Discussed with daughter who states pt is very safe, very loved by spouse. Discussed potential side effects from anxiety going untreated to include panic attacks, medical emergencies, and other potential factors- daughter does not feel it is at that level]       Follow up plan: Follow up call scheduled for 12/04/22    Encounter Outcome:  Pt. Visit Completed

## 2022-11-13 ENCOUNTER — Other Ambulatory Visit: Payer: Medicare Other

## 2022-11-13 ENCOUNTER — Telehealth: Payer: Self-pay

## 2022-11-13 NOTE — Telephone Encounter (Signed)
Lmom to discuss pts lab results and recommendations further.

## 2022-11-13 NOTE — Progress Notes (Signed)
   11/13/2022  Patient ID: Rita Myers, female   DOB: 29-Mar-1946, 77 y.o.   MRN: [id number]  Patient outreach for scheduled telephone visit.  I initially was not able to reach [name]'s daughter, [name]; but she returned my call and rescheduled the visit to next Thursday at 2pm.  Lenna Gilford, PharmD, DPLA

## 2022-11-23 ENCOUNTER — Other Ambulatory Visit: Payer: Medicare Other

## 2022-11-23 NOTE — Progress Notes (Signed)
   11/23/2022  Patient ID: Rita Myers, female   DOB: 1945/08/09, 77 y.o.   MRN: [id number]  Telephone visit with patient's daughter, [name], to assist with affordability of Eliquis.    -Patient taking Eliquis 5mg  BID at expensive copay -She would meet income requirements for [facility] PAP program, and daughter is aware of out-of-pocket expenditure and is contacting pharmacies for this documentation -Coordinating with medication assistance team to initiate the application process.  Daughter requests that the application be mailed to her (she is POA) at:  [employer] [address] [place], [place] [address]  Follow-up:  Will monitor progress and keep daughter/provider informed.  Lenna Gilford, PharmD, DPLA

## 2022-12-04 ENCOUNTER — Ambulatory Visit: Payer: Self-pay | Admitting: *Deleted

## 2022-12-04 NOTE — Patient Outreach (Signed)
  Care Coordination   Follow Up Visit Note   12/04/2022 Name: Rita Myers MRN: [id number] DOB: [birth year]  Rita Myers is a 77 y.o. year old female who sees Morene Crocker, MD for primary care. I  spoke with pt's daughter by phone today-   What matters to the patients health and wellness today?  Per daughter, pt is doing fine. Declines further CSW outreach.     Goals Addressed             This Visit's Progress    COMPLETED: Link patient with support to reduce anxiety       Activities and task to complete in order to accomplish goals.   Consider in-person visit with a counselor as discussed (can meet with myself at office) and explore options in the community if desired Start / continue relaxed breathing 3 times daily Keep all upcoming appointments   Continue with compliance of taking medication prescribed by Doctor  Consider therapeutic counseling referral- let me know if this is wanted        SDOH assessments and interventions completed:  Yes     Care Coordination Interventions:  Yes, provided  Interventions Today    Flowsheet Row Most Recent Value  Chronic Disease   Chronic disease during today's visit Other  [stress/anxiety]  Mental Health Interventions   Mental Health Discussed/Reviewed Anxiety, Other  [Per daughter,  pt is doing fine. Declines further CSW outreach.]       Follow up plan: No further intervention required.   Encounter Outcome:  Pt. Visit Completed

## 2022-12-05 ENCOUNTER — Ambulatory Visit: Payer: Self-pay | Admitting: *Deleted

## 2022-12-05 NOTE — Patient Outreach (Signed)
  Care Coordination   Follow Up Visit Note   12/05/2022 Name: Rita Myers MRN: [id number] DOB: Mar 02, 1946  Rita Myers is a 77 y.o. year old female who sees Morene Crocker, MD for primary care. I  received phone call from pt's daughter, [name], today.  CSW explained to daughter that her mother will not be able to be seen by [facility] in office on 01/01/23; however, I was still able to come and meet her in person on the day of the PCP visit to assess her anxiety/stress and needs and to assist with linking her with an on-going Counselor/Therapist in the community for on-gong therapy if desired. Daughter understands above and declines- she is familiar with this process and will arrange if pt decides to pursue in the future. CSW provided the "psychologytoday" website for her to filter providers in network and as well as other filtering options.     CSW has signed off and will be available if their needs/interests change.      Reece Levy, MSW, LCSW Clinical Social Worker [facility] [phone]

## 2022-12-11 ENCOUNTER — Telehealth: Payer: Self-pay

## 2022-12-11 NOTE — Telephone Encounter (Signed)
-----   Message from Lenna Gilford sent at 12/07/2022  2:51 PM EDT ----- Peri Jefferson afternoon!  Patient is taking Eliquis 5mg  BID at expensive copay, and she would meet income requirements for BMS PAP program.  Daughter is aware of out-of-pocket expenditure and is contacting pharmacies for this documentation.  Would you all mind to initiate the application process for her, please?  Daughter requests that the application be mailed to her (she is POA) at:  [employer] [address] [place], [place] [address]

## 2022-12-12 NOTE — Telephone Encounter (Signed)
PAP application for ELIQUIS THROUGH [facility]  has been mailed to pt home.   I will fax PCP pages once I receive pt pages    PLEASED BE ADVISED

## 2022-12-26 NOTE — Telephone Encounter (Signed)
Spoke with pts daughter on 10/20/22. She was notified of lab results. Pt is taking Crestor as directed and pt was fasting the morning lab work was completed. Pt's daughter would like to know if there is anything they need to do different (diet ect).  Per Bernadene Person NP, pt could increase Crestor to 20 mg daily if she was agreeable and repeat fasting lab work in 6-8 weeks.   Spoke with pts daughter on 12/26/2022 and at this time, pt is doing well and they'll wait to discuss further at pts apt 01/19/2023 with Bernadene Person NP.

## 2022-12-29 DIAGNOSIS — N049 Nephrotic syndrome with unspecified morphologic changes: Secondary | ICD-10-CM | POA: Diagnosis not present

## 2022-12-29 DIAGNOSIS — N022 Recurrent and persistent hematuria with diffuse membranous glomerulonephritis: Secondary | ICD-10-CM | POA: Diagnosis not present

## 2022-12-29 DIAGNOSIS — I10 Essential (primary) hypertension: Secondary | ICD-10-CM | POA: Diagnosis not present

## 2022-12-29 DIAGNOSIS — R809 Proteinuria, unspecified: Secondary | ICD-10-CM | POA: Diagnosis not present

## 2023-01-01 ENCOUNTER — Institutional Professional Consult (permissible substitution): Payer: Self-pay | Admitting: Licensed Clinical Social Worker

## 2023-01-01 ENCOUNTER — Encounter: Payer: Medicare Other | Admitting: Student

## 2023-01-19 ENCOUNTER — Encounter: Payer: Self-pay | Admitting: Nurse Practitioner

## 2023-01-19 ENCOUNTER — Ambulatory Visit: Payer: Medicare Other | Attending: Nurse Practitioner | Admitting: Nurse Practitioner

## 2023-01-19 VITALS — BP 140/86 | HR 95 | Ht <= 58 in | Wt 132.0 lb

## 2023-01-19 DIAGNOSIS — Z87898 Personal history of other specified conditions: Secondary | ICD-10-CM | POA: Diagnosis not present

## 2023-01-19 DIAGNOSIS — R000 Tachycardia, unspecified: Secondary | ICD-10-CM | POA: Diagnosis not present

## 2023-01-19 DIAGNOSIS — R079 Chest pain, unspecified: Secondary | ICD-10-CM | POA: Diagnosis not present

## 2023-01-19 DIAGNOSIS — I480 Paroxysmal atrial fibrillation: Secondary | ICD-10-CM

## 2023-01-19 DIAGNOSIS — E782 Mixed hyperlipidemia: Secondary | ICD-10-CM

## 2023-01-19 DIAGNOSIS — I10 Essential (primary) hypertension: Secondary | ICD-10-CM | POA: Diagnosis not present

## 2023-01-19 DIAGNOSIS — I429 Cardiomyopathy, unspecified: Secondary | ICD-10-CM | POA: Diagnosis not present

## 2023-01-19 DIAGNOSIS — I471 Supraventricular tachycardia, unspecified: Secondary | ICD-10-CM

## 2023-01-19 DIAGNOSIS — I351 Nonrheumatic aortic (valve) insufficiency: Secondary | ICD-10-CM

## 2023-01-19 DIAGNOSIS — I272 Pulmonary hypertension, unspecified: Secondary | ICD-10-CM | POA: Diagnosis not present

## 2023-01-19 DIAGNOSIS — I340 Nonrheumatic mitral (valve) insufficiency: Secondary | ICD-10-CM

## 2023-01-19 MED ORDER — ROSUVASTATIN CALCIUM 20 MG PO TABS: 20.0000 mg | ORAL_TABLET | Freq: Every day | ORAL | 3 refills | Status: DC

## 2023-01-19 NOTE — Patient Instructions (Signed)
Medication Instructions:  Increase Crestor to 20 mg ( Take 1 Tablet Daily). *If you need a refill on your cardiac medications before your next appointment, please call your pharmacy*   Lab Work: CMET, Lipid Panel : 6-8 Weeks If you have labs (blood work) drawn today and your tests are completely normal, you will receive your results only by: MyChart Message (if you have MyChart) OR A paper copy in the mail If you have any lab test that is abnormal or we need to change your treatment, we will call you to review the results.   Testing/Procedures: No Testing   Follow-Up: At [facility], you and your health needs are our priority.  As part of our continuing mission to provide you with exceptional heart care, we have created designated Provider Care Teams.  These Care Teams include your primary Cardiologist (physician) and Advanced Practice Providers (APPs -  Physician Assistants and Nurse Practitioners) who all work together to provide you with the care you need, when you need it.  We recommend signing up for the patient portal called "MyChart".  Sign up information is provided on this After Visit Summary.  MyChart is used to connect with patients for Virtual Visits (Telemedicine).  Patients are able to view lab/test results, encounter notes, upcoming appointments, etc.  Non-urgent messages can be sent to your provider as well.   To learn more about what you can do with MyChart, go to [email or web address].    Your next appointment:   6 month(s)  Provider:   Olga Millers, MD

## 2023-01-19 NOTE — Progress Notes (Signed)
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   Past Medical History    Past Medical History:  Diagnosis Date   Aortic insufficiency    Essential hypertension 07/11/2017   Hyperlipemia    Hypertension    Hypokalemia 07/11/2017   Mitral regurgitation    Nausea, vomiting, and diarrhea 07/11/2017   Paroxysmal A-fib (HCC) 07/11/2017   Pulmonary hypertension (HCC)    Syncope, vasovagal 07/11/2017   History reviewed. No pertinent surgical history.  Allergies  No Known Allergies   Labs/Other Studies Reviewed    The following studies were reviewed today:  Cardiac Studies & Procedures       ECHOCARDIOGRAM  ECHOCARDIOGRAM COMPLETE 05/18/2022  Narrative ECHOCARDIOGRAM REPORT    Patient Name:   Rita Myers Date of Exam: 05/18/2022 Medical Rec #:  [id number]   Height:       52.0 in Accession #:    [id number]  Weight:       141.0 lb Date of Birth:  [birth year]  BSA:          1.451 m Patient Age:    76 years    BP:           177/79 mmHg Patient Gender: F           HR:           93 bpm. Exam Location:  Inpatient  Procedure: 2D Echo, Cardiac Doppler and Color Doppler  Indications:    Dyspnea R06.00  History:        Patient has prior history of Echocardiogram examinations, most recent 11/22/2020. Arrythmias:Atrial Fibrillation, Signs/Symptoms:Syncope; Risk Factors:Hypertension, Diabetes and Dyslipidemia.  Sonographer:    Lucendia Herrlich Referring Phys: [id number] STEPHEN  RANCOUR  IMPRESSIONS   1. Left ventricular ejection fraction, by estimation, is 40 to 45%. The left ventricle has mildly decreased function. The left ventricle demonstrates global hypokinesis. Left ventricular diastolic parameters are consistent with Grade II diastolic dysfunction (pseudonormalization). Elevated left atrial pressure. 2. Right ventricular systolic function is normal. The right ventricular size is normal. There is mildly elevated pulmonary artery systolic pressure. The estimated right ventricular systolic pressure is 43.4 mmHg. 3. Left atrial size was mildly dilated. 4. A small pericardial effusion is present. 5. The mitral valve is abnormal. Mild mitral valve regurgitation. Moderate mitral annular calcification. 6. The aortic valve is tricuspid. Aortic valve regurgitation is trivial. Aortic valve sclerosis is present, with no evidence of aortic valve stenosis. 7. The inferior vena cava is normal in size with greater than 50% respiratory variability, suggesting right atrial pressure of 3 mmHg.  FINDINGS Left Ventricle: Left ventricular ejection fraction, by estimation, is 40 to 45%. The left ventricle has mildly decreased function. The left ventricle demonstrates global hypokinesis. The left ventricular internal cavity size was normal in size. There is no left ventricular hypertrophy. Left ventricular diastolic parameters are consistent with Grade II diastolic dysfunction (pseudonormalization). Elevated left atrial pressure.  Right Ventricle: The right ventricular size is normal. No increase in right ventricular wall thickness. Right ventricular systolic function is normal. There is mildly elevated pulmonary artery systolic pressure. The tricuspid  regurgitant velocity is 3.18 m/s, and with an assumed right atrial pressure of 3 mmHg, the estimated right ventricular systolic pressure is 43.4 mmHg.  Left Atrium: Left atrial size was mildly dilated.  Right Atrium: Right atrial size  was normal in size.  Pericardium: A small pericardial effusion is present.  Mitral Valve: The mitral valve is abnormal. Moderate mitral annular calcification. Mild mitral valve regurgitation.  Tricuspid Valve: The tricuspid valve is normal in structure. Tricuspid valve regurgitation is mild.  Aortic Valve: The aortic valve is tricuspid. Aortic valve regurgitation is trivial. Aortic regurgitation PHT measures 660 msec. Aortic valve sclerosis is present, with no evidence of aortic valve stenosis. Aortic valve mean gradient measures 5.0 mmHg. Aortic valve peak gradient measures 9.3 mmHg. Aortic valve area, by VTI measures 1.85 cm.  Pulmonic Valve: The pulmonic valve was grossly normal. Pulmonic valve regurgitation is trivial.  Aorta: The aortic root and ascending aorta are structurally normal, with no evidence of dilitation.  Venous: The inferior vena cava is normal in size with greater than 50% respiratory variability, suggesting right atrial pressure of 3 mmHg.  IAS/Shunts: The interatrial septum was not well visualized.   LEFT VENTRICLE PLAX 2D LVIDd:         5.20 cm   Diastology LVIDs:         4.10 cm   LV e' medial:    4.13 cm/s LV PW:         0.90 cm   LV E/e' medial:  23.9 LV IVS:        0.90 cm   LV e' lateral:   4.90 cm/s LVOT diam:     1.90 cm   LV E/e' lateral: 20.1 LV SV:         53 LV SV Index:   37 LVOT Area:     2.84 cm   RIGHT VENTRICLE             IVC RV S prime:     18.10 cm/s  IVC diam: 1.20 cm TAPSE (M-mode): 3.0 cm  LEFT ATRIUM           Index        RIGHT ATRIUM           Index LA diam:      4.30 cm 2.96 cm/m   RA Area:     13.20 cm LA Vol (A2C): 31.1 ml 21.44 ml/m  RA Volume:   28.30 ml  19.51 ml/m LA Vol (A4C): 73.6 ml 50.74 ml/m AORTIC VALVE AV Area (Vmax):    1.87 cm AV Area (Vmean):   1.90 cm AV Area (VTI):     1.85 cm AV Vmax:           152.50 cm/s AV Vmean:          105.350 cm/s AV VTI:            0.286 m AV Peak Grad:      9.3  mmHg AV Mean Grad:      5.0 mmHg LVOT Vmax:         100.80 cm/s LVOT Vmean:        70.567 cm/s LVOT VTI:          0.187 m LVOT/AV VTI ratio: 0.65 AI PHT:            660 msec  AORTA Ao Root diam: 2.70 cm Ao Asc diam:  2.30 cm  MITRAL VALVE  TRICUSPID VALVE MV Area (PHT): 3.57 cm     TR Peak grad:   40.4 mmHg MV Decel Time: 213 msec     TR Vmax:        318.00 cm/s MR Peak grad: 173.7 mmHg MR Mean grad: 124.0 mmHg    SHUNTS MR Vmax:      659.00 cm/s   Systemic VTI:  0.19 m MR Vmean:     537.0 cm/s    Systemic Diam: 1.90 cm MV E velocity: 98.65 cm/s MV A velocity: 129.50 cm/s MV E/A ratio:  0.76  Epifanio Lesches MD Electronically signed by Epifanio Lesches MD Signature Date/Time: 05/18/2022/3:51:14 PM    Final    MONITORS  CARDIAC EVENT MONITOR 07/27/2022  Narrative Sinus bradycardia, normal sinus rhythm, sinus tachycardia, PACs, brief PAT, 5 beats nonsustained ventricular tachycardia. Olga Millers, MD          Recent Labs: 04/18/2022: NT-Pro BNP 717 08/02/2022: Magnesium 2.2 09/03/2022: B Natriuretic Peptide 178.0; BUN 64; Creatinine, Ser 1.18; Hemoglobin 10.7; Platelets 124; Potassium 4.4; Sodium 136 10/17/2022: ALT 17 10/23/2022: TSH 3.990  Recent Lipid Panel    Component Value Date/Time   CHOL 256 (H) 10/17/2022 0815   TRIG 237 (H) 10/17/2022 0815   HDL 62 10/17/2022 0815   CHOLHDL 4.1 10/17/2022 0815   CHOLHDL 3.6 05/19/2022 0055   VLDL 33 05/19/2022 0055   LDLCALC 151 (H) 10/17/2022 0815    History of Present Illness    77 year old female with the above past medial history including paroxysmal atrial fibrillation, cardiomyopathy, hypertension, pulmonary hypertension by echo in 2020, 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   Echocardiogram in July 2022 showed normal LV function, G2 DD, mild mitral valve regurgitation, mild aortic insufficiency.  She traveled to [place] in  04/2022 and developed lower extremity edema.  Venous Dopplers per report showed thrombus in her left lesser saphenous vein. She was treated with a short course of Lasix with no significant improvement. Follow-up venous Dopplers on 04/18/2022 showed no DVT.  She was hospitalized in January 2024 in the setting of progressive bilateral lower extremity edema that extended into her thighs.  Repeat echocardiogram showed EF 40 to 45%, mildly decreased LV function, LV global hypokinesis, G2 DD, normal RV systolic function, mildly elevated PASP, small pericardial effusion, mild mitral valve regurgitation, moderate MAC, trivial aortic valve regurgitation.  She was noted to have hypoalbuminemia and was diagnosed with nephrotic syndrome. It was felt that her lower extremity edema was multifactorial in the setting of nephrotic syndrome, possible acute on chronic systolic heart failure. 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  She was treated with rituximab.  She was evaluated in the ED in 05/2022 in the setting of chest pain.  CT angio of the chest was negative for PE.  Troponin was negative.  She was discharged home with recommendations for consideration of possible outpatient stress test.  She returned to the ED on 06/27/2022 following a syncopal episode.  She denied complete loss of consciousness.  She was hypotensive per EMS.  BP improved with fluid resuscitation.  CT/MRI were negative for acute findings.  Sodium was 126.  It was felt that her symptoms were likely in the setting of volume depletion, dehydration with combined hyponatremia.  She was hospitalized in March 2024 in the setting of hypervolemic hyponatremia due to nephrotic syndrome following a syncopal episode.  Sodium was 120 on admission.  She was diuresed.  Orthostatics were negative.  30-day event monitor revealed sinus bradycardia, sinus rhythm, sinus tachycardia, PACs, brief PAT, 5 beats of NSVT.  She returned to the  ED on 09/03/2022 following another syncopal episode.  Work up was unremarkable. She was last seen in the office on 10/10/2022 and was stable from a cardiac standpoint. She denied any recurrent syncope, denied symptoms concerning for angina. She declined ischemic evaluation.   She presents today for follow-up accompanied by her husband. Since her last visit she has been stable from a cardiac standpoint.  She denies any recurrent syncope or presyncope, denies palpitations, dizziness, denies symptoms concerning for angina.  She denies any dyspnea, edema, PND, orthopnea, weight gain.  She notes that nephrology decreased her torsemide to 60 mg daily.  She has increased her activity and is walking twice a day for exercise.  Overall, she reports feeling well.  Home Medications    Current Outpatient Medications  Medication Sig Dispense Refill   apixaban (ELIQUIS) 5 MG TABS tablet Take 1 tablet (5 mg total) by mouth 2 (two) times daily. 60 tablet 11   levothyroxine (SYNTHROID) 25 MCG tablet Take 1 tablet (25 mcg total) by mouth every morning. 90 tablet 3   loratadine (CLARITIN) 10 MG tablet Take 10 mg by mouth daily.     losartan (COZAAR) 100 MG tablet Take 1 tablet (100 mg total) by mouth daily. 90 tablet 3   metoprolol succinate (TOPROL-XL) 25 MG 24 hr tablet Take 1 tablet (25 mg total) by mouth daily. 30 tablet 11   potassium chloride SA (KLOR-CON M) 20 MEQ tablet Take 1 tablet (20 mEq total) by mouth daily. 30 tablet 11   rosuvastatin (CRESTOR) 20 MG tablet Take 1 tablet (20 mg total) by mouth daily. 90 tablet 3   torsemide (DEMADEX) 100 MG tablet Take 1 tablet (100 mg total) by mouth daily. (Patient taking differently: Take 60 mg by mouth daily.) 60 tablet 0   No current facility-administered medications for this visit.     Review of Systems    She denies chest pain, palpitations, dyspnea, pnd, orthopnea, n, v, dizziness, syncope, edema, weight gain, or early satiety. All other systems reviewed and  are otherwise negative except as noted above.   Physical Exam    VS:  BP (!) 140/86 (BP Location: Left Arm, Patient Position: Sitting, Cuff Size: Normal)   Pulse 95   Ht 4\' 8"  (1.422 m)   Wt 132 lb (59.9 kg)   SpO2 98%   BMI 29.59 kg/m   GEN: Well nourished, well developed, in no acute distress. HEENT: normal. Neck: Supple, no JVD, carotid bruits, or masses. Cardiac: IRIR (audible ectopy), no murmurs, rubs, or gallops. No clubbing, cyanosis, edema.  Radials/DP/PT 2+ and equal bilaterally.  Respiratory:  Respirations regular and unlabored, clear to auscultation bilaterally. GI: Soft, nontender, nondistended, BS + x 4. MS: no deformity or atrophy. Skin: warm and dry, no rash. Neuro:  Strength and sensation are intact. Psych: Normal affect.  Accessory Clinical Findings    ECG personally reviewed by me today -sinus rhythm, 95 bpm, PACs- no acute changes.   Lab Results  Component Value Date   WBC 8.9 09/03/2022   HGB 10.7 (L) 09/03/2022   HCT 32.3 (L) 09/03/2022   MCV 95.6 09/03/2022   PLT 124 (L) 09/03/2022   Lab Results  Component Value Date   CREATININE 1.18 (H) 09/03/2022   BUN 64 (H) 09/03/2022   NA 136 09/03/2022   K 4.4 09/03/2022   CL 103 09/03/2022  CO2 24 09/03/2022   Lab Results  Component Value Date   ALT 17 10/17/2022   AST 27 10/17/2022   ALKPHOS 44 10/17/2022   BILITOT <0.2 10/17/2022   Lab Results  Component Value Date   CHOL 256 (H) 10/17/2022   HDL 62 10/17/2022   LDLCALC 151 (H) 10/17/2022   TRIG 237 (H) 10/17/2022   CHOLHDL 4.1 10/17/2022    Lab Results  Component Value Date   HGBA1C 6.1 (H) 05/19/2022    Assessment & Plan    1. History of syncope: History of prior syncope. Prior CT/MRI negative for acute findings.  MRA of the neck showed patent carotid arteries.  Most recent echo stable as below. 30-day event monitor revealed sinus bradycardia, sinus rhythm, sinus tachycardia, PACs, brief PAT, 5 beats of NSVT. Syncope most likely  occurred in the setting of dehydration, hyponatremia.  Denies any recurrent syncope.  Discussed ED precautions. Encouraged adequate hydration, continued follow-up with nephrology.    2. Cardiomyopathy/chest pain: Echo in 05/2022 showed EF 40 to 45%, mildly decreased LV function, LV global hypokinesis, G2 DD, normal RV systolic function, mildly elevated PASP, small pericardial effusion, mild mitral regurgitation, moderate MAC, trivial aortic valve regurgitation. Euvolemic and well compensated on exam.  She did have prior episodes of chest pain. Stable with no anginal symptoms. No indication for ischemic evaluation (she previously declined as well).    3. Paroxysmal atrial fibrillation: Most recent cardiac monitor as above (no evidence of recurrent atrial fibrillation).  EKG today shows sinus rhythm with frequent PACs. She denies any recent palpitations.  Continue Eliquis, metoprolol.   4. SVT/NSVT/tachycardia: Stable. Continue metoprolol.     5. Hypertension: BP slightly elevated in office today, generally well controlled. Continue current antihypertensive regimen.    6. Pulmonary hypertension: Stable on most recent echo.    7. Valvular heart disease: Most recent echo stable with evidence of mild mitral valve regurgitation, moderate MAC, trivial aortic valve regurgitation. Asymptomatic.    8. Nephrotic syndrome:  She has received rituximab infusions for THSD7A-associated membranous nephropathy.  Following with nephrology.  Continue torsemide per nephrology.   9. Hyperlipidemia: LDL was 151 on 10/17/2022.  Will increase Crestor to 20 mg daily and repeat fasting lipid panel, LFTs.     10. Disposition: Follow-up in 6 months with Dr. Jens Som.    Joylene Grapes, NP 01/19/2023, 10:31 AM

## 2023-02-05 ENCOUNTER — Telehealth: Payer: Self-pay | Admitting: Cardiology

## 2023-02-05 ENCOUNTER — Ambulatory Visit: Payer: Medicare Other | Admitting: Student

## 2023-02-05 ENCOUNTER — Telehealth: Payer: Self-pay

## 2023-02-05 VITALS — BP 167/59 | HR 99 | Temp 98.1°F | Wt 131.0 lb

## 2023-02-05 DIAGNOSIS — I5020 Unspecified systolic (congestive) heart failure: Secondary | ICD-10-CM

## 2023-02-05 DIAGNOSIS — I10 Essential (primary) hypertension: Secondary | ICD-10-CM | POA: Diagnosis not present

## 2023-02-05 DIAGNOSIS — Z23 Encounter for immunization: Secondary | ICD-10-CM | POA: Diagnosis not present

## 2023-02-05 DIAGNOSIS — E7800 Pure hypercholesterolemia, unspecified: Secondary | ICD-10-CM | POA: Diagnosis not present

## 2023-02-05 DIAGNOSIS — R7303 Prediabetes: Secondary | ICD-10-CM | POA: Diagnosis not present

## 2023-02-05 DIAGNOSIS — Z13820 Encounter for screening for osteoporosis: Secondary | ICD-10-CM

## 2023-02-05 DIAGNOSIS — I110 Hypertensive heart disease with heart failure: Secondary | ICD-10-CM

## 2023-02-05 MED ORDER — ROSUVASTATIN CALCIUM 10 MG PO TABS: 10.0000 mg | ORAL_TABLET | Freq: Every day | ORAL | Status: DC

## 2023-02-05 NOTE — Telephone Encounter (Signed)
Return call to pt's daughter who stated after pt left our office this morning, pt started having leg cramps; from her hips down her legs. Pt's daughter stated Crestor dosage was recently increase and cramps  are  a side effect. Pt saw Dr Geraldo Pitter. She wants to know what can be done? Thanks

## 2023-02-05 NOTE — Telephone Encounter (Signed)
Pt's daughter requesting to speak with a nurse about legs cramping. Please call back.

## 2023-02-05 NOTE — Progress Notes (Unsigned)
   CC: Routine follow-up  HPI:  Rita Myers is a 77 y.o. female with PMH as below who presents to the clinic for routine follow-up visit.  Please see assessment and plan for further details.  Past Medical History:  Diagnosis Date   Aortic insufficiency    Essential hypertension 07/11/2017   Hyperlipemia    Hypertension    Hypokalemia 07/11/2017   Mitral regurgitation    Nausea, vomiting, and diarrhea 07/11/2017   Paroxysmal A-fib (HCC) 07/11/2017   Pulmonary hypertension (HCC)    Syncope, vasovagal 07/11/2017   Review of Systems:   Pertinent items noted in HPI and/or A&P.  Physical Exam:  Vitals:   02/05/23 1043 02/05/23 1053  BP: (!) 170/66 (!) 167/59  Pulse: (!) 101 99  Temp: 98.1 F (36.7 C)   TempSrc: Oral   SpO2: 100%   Weight: 131 lb (59.4 kg)     Constitutional: Well-appearing elderly female. In no acute distress. HEENT: Normocephalic, atraumatic, Sclera non-icteric, PERRL, EOM intact Cardio:Regular rate and rhythm. 2+ bilateral radial pulses. Pulm:Clear to auscultation bilaterally. Normal work of breathing on room air. Abdomen: Soft, non-tender, non-distended, positive bowel sounds. WUJ:[id number] for extremity edema. Skin:Warm and dry. Neuro:Alert and oriented x3. No focal deficit noted. Psych:Pleasant mood and affect.   Assessment & Plan:   Essential hypertension Blood pressure today elevated at 167/59.  Her daughter is with her and states that the blood pressure log they keep at home is mostly 120-140 systolic.  She has had issues with falls in the past due to low blood pressures.  Due to this history she is likely on the maximum dose of medication she can tolerate we will not make any changes today. - Continue losartan 100 mg daily, metoprolol 25 mg daily, and torsemide 60 mg daily  Heart failure with reduced ejection fraction (HCC) Patient continues to have good control of her symptoms and fluid.  She was recently seen by cardiology in 09/2022 as well  as by nephrology about 2 weeks ago.  At that time they decreased her torsemide from 100 mg daily to 60 mg daily.  At her cardiology appointment her LDL was 151 and so she was increased to rosuvastatin 20 mg daily. - Continue losartan 100 mg daily, metoprolol 25 mg daily, torsemide 60 mg daily, rosuvastatin 20 mg daily - Repeat lipid panel and CMP today  Pure hypercholesterolemia LDL of 151 and 10/2022.  Since then rosuvastatin has been increased from 10 to 20 mg daily.  We will repeat a lipid panel and CMP to monitor liver enzymes.    Patient discussed with Dr. Julieanne Cotton, DO [facility] Resident PGY-2 Pager: [phone]

## 2023-02-05 NOTE — Telephone Encounter (Signed)
Pt c/o medication issue:  1. Name of Medication:   rosuvastatin (CRESTOR) 20 MG tablet    2. How are you currently taking this medication (dosage and times per day)? Take 1 tablet (20 mg total) by mouth daily.   3. Are you having a reaction (difficulty breathing--STAT)? No  4. What is your medication issue? Patient's daughter is calling because the patient has been having cramps from the hip down in one of the legs. Patient's daughter is concerned if the patient should continue taking this medication.

## 2023-02-05 NOTE — Telephone Encounter (Signed)
Spoke with daughter and she stated that mom is having cramps since increasing her dose of crestor. She would like to know if she needs to continue taking the crestor

## 2023-02-05 NOTE — Patient Instructions (Signed)
  Thank you, [name], for allowing [place] to provide your care today.  It was a pleasure to meet you today and I am glad that you are doing well after the recent stressful year.  We are not going to make any changes to medications and if you do need any refills please do not hesitate to call our office.  You may resume taking your Benefiber in the morning.  If you have any issues with constipation please do not hesitate to call our office.  Today we will send an order for a DEXA scan which will measure your bone density.  Someone will call you to schedule this and you can discuss the procedure to make sure this is not too stressful.   I have ordered the following labs for you:  Lab Orders         Lipid Profile         CMP14 + Anion Gap        I have ordered the following medication/changed the following medications:   Stop the following medications: There are no discontinued medications.   Start the following medications: No orders of the defined types were placed in this encounter.     Follow up: 3 months    Remember:     Should you have any questions or concerns please call the internal medicine clinic at [phone].     Rocky Morel, DO [facility]

## 2023-02-05 NOTE — Telephone Encounter (Signed)
Spoke with pt daughter, aware the patient can go back to the 10 mg rosuvastatin. Ask her to stop crestor completely for about 2 weeks for the symptoms to improve but the daughter states she will not do that. So she will restart the 10 mg rosuvastatin and let us know of any problems with that dosage.

## 2023-02-06 LAB — CMP14 + ANION GAP
ALT: 17 IU/L (ref 0–32)
AST: 28 IU/L (ref 0–40)
Albumin: 2.9 g/dL — ABNORMAL LOW (ref 3.8–4.8)
Alkaline Phosphatase: 59 IU/L (ref 44–121)
Anion Gap: 14 mmol/L (ref 10.0–18.0)
BUN/Creatinine Ratio: 36 — ABNORMAL HIGH (ref 12–28)
BUN: 41 mg/dL — ABNORMAL HIGH (ref 8–27)
Bilirubin Total: <0.2 mg/dL (ref 0.0–1.2)
CO2: 24 mmol/L (ref 20–29)
Calcium: 8.2 mg/dL — ABNORMAL LOW (ref 8.7–10.3)
Chloride: 103 mmol/L (ref 96–106)
Creatinine, Ser: 1.13 mg/dL — ABNORMAL HIGH (ref 0.57–1.00)
Globulin, Total: 1.9 g/dL (ref 1.5–4.5)
Glucose: 137 mg/dL — ABNORMAL HIGH (ref 70–99)
Potassium: 4.6 mmol/L (ref 3.5–5.2)
Sodium: 141 mmol/L (ref 134–144)
Total Protein: 4.8 g/dL — ABNORMAL LOW (ref 6.0–8.5)
eGFR: 50 mL/min/{1.73_m2} — ABNORMAL LOW (ref >59)

## 2023-02-06 LAB — LIPID PANEL
Chol/HDL Ratio: 3.8 ratio (ref 0.0–4.4)
Cholesterol, Total: 230 mg/dL — ABNORMAL HIGH (ref 100–199)
HDL: 60 mg/dL (ref >39)
LDL Chol Calc (NIH): 114 mg/dL — ABNORMAL HIGH (ref 0–99)
Triglycerides: 323 mg/dL — ABNORMAL HIGH (ref 0–149)
VLDL Cholesterol Cal: 56 mg/dL — ABNORMAL HIGH (ref 5–40)

## 2023-02-06 MED ORDER — ROSUVASTATIN CALCIUM 20 MG PO TABS: 20.0000 mg | ORAL_TABLET | Freq: Every day | ORAL | Status: DC

## 2023-02-06 MED ORDER — TORSEMIDE 60 MG PO TABS: 60.0000 mg | ORAL_TABLET | Freq: Every day | ORAL | Status: AC

## 2023-02-06 NOTE — Assessment & Plan Note (Signed)
LDL of 151 and 10/2022.  Since then rosuvastatin has been increased from 10 to 20 mg daily.  We will repeat a lipid panel and CMP to monitor liver enzymes.

## 2023-02-06 NOTE — Assessment & Plan Note (Signed)
Patient continues to have good control of her symptoms and fluid.  She was recently seen by cardiology in 09/2022 as well as by nephrology about 2 weeks ago.  At that time they decreased her torsemide from 100 mg daily to 60 mg daily.  At her cardiology appointment her LDL was 151 and so she was increased to rosuvastatin 20 mg daily. - Continue losartan 100 mg daily, metoprolol 25 mg daily, torsemide 60 mg daily, rosuvastatin 20 mg daily - Repeat lipid panel and CMP today

## 2023-02-06 NOTE — Assessment & Plan Note (Signed)
Blood pressure today elevated at 167/59.  Her daughter is with her and states that the blood pressure log they keep at home is mostly 120-140 systolic.  She has had issues with falls in the past due to low blood pressures.  Due to this history she is likely on the maximum dose of medication she can tolerate we will not make any changes today. - Continue losartan 100 mg daily, metoprolol 25 mg daily, and torsemide 60 mg daily

## 2023-02-07 NOTE — Progress Notes (Signed)
Called and spoke to the patient's daughter, Rita Myers, who is present during the clinic visit.  Discussed improving lipid panel with LDL down from 150-114.  Unfortunately she had muscle cramps with the increased dose of rosuvastatin and decreased her dose back to 10 mg after talking to her cardiologist after our appointment.  She is no longer having these muscle cramps.  Plan is to recheck lipid panel in 1 month with a lab only appointment and then discuss switching to a statin with less myopathy side effects or adding ezetimibe.  CMP stable from prior.

## 2023-02-07 NOTE — Progress Notes (Signed)
 Internal Medicine Clinic Attending  Case discussed with the resident physician at the time of the visit.  We reviewed the patient's history, exam, and pertinent patient test results.  I agree with the assessment, diagnosis, and plan of care documented in the resident's note.

## 2023-02-08 ENCOUNTER — Other Ambulatory Visit: Payer: Self-pay | Admitting: Student

## 2023-02-08 DIAGNOSIS — E7800 Pure hypercholesterolemia, unspecified: Secondary | ICD-10-CM

## 2023-03-01 ENCOUNTER — Ambulatory Visit: Payer: Medicare Other | Admitting: Student

## 2023-03-01 VITALS — BP 163/75 | HR 105 | Temp 98.2°F | Wt 133.7 lb

## 2023-03-01 DIAGNOSIS — J209 Acute bronchitis, unspecified: Secondary | ICD-10-CM | POA: Insufficient documentation

## 2023-03-01 DIAGNOSIS — I471 Supraventricular tachycardia, unspecified: Secondary | ICD-10-CM | POA: Diagnosis not present

## 2023-03-01 DIAGNOSIS — R079 Chest pain, unspecified: Secondary | ICD-10-CM | POA: Diagnosis not present

## 2023-03-01 DIAGNOSIS — I480 Paroxysmal atrial fibrillation: Secondary | ICD-10-CM | POA: Diagnosis not present

## 2023-03-01 DIAGNOSIS — J019 Acute sinusitis, unspecified: Secondary | ICD-10-CM | POA: Insufficient documentation

## 2023-03-01 DIAGNOSIS — I429 Cardiomyopathy, unspecified: Secondary | ICD-10-CM | POA: Diagnosis not present

## 2023-03-01 DIAGNOSIS — R000 Tachycardia, unspecified: Secondary | ICD-10-CM | POA: Diagnosis not present

## 2023-03-01 MED ORDER — BENZONATATE 100 MG PO CAPS: 100.0000 mg | ORAL_CAPSULE | Freq: Four times a day (QID) | ORAL | 1 refills | Status: AC | PRN

## 2023-03-01 MED ORDER — FLUTICASONE PROPIONATE 50 MCG/ACT NA SUSP: 2.0000 | Freq: Every day | NASAL | 2 refills | Status: DC

## 2023-03-01 MED ORDER — GUAIFENESIN ER 600 MG PO TB12: 600.0000 mg | ORAL_TABLET | Freq: Two times a day (BID) | ORAL | 0 refills | Status: AC

## 2023-03-01 NOTE — Patient Instructions (Signed)
Thank you, Ms.Latrena Street for allowing [place] to provide your care today.   Start the following medications: Meds ordered this encounter  Medications   fluticasone (FLONASE) 50 MCG/ACT nasal spray    Sig: Place 2 sprays into both nostrils daily.    Dispense:  18.2 mL    Refill:  2   benzonatate (TESSALON PERLES) 100 MG capsule    Sig: Take 1 capsule (100 mg total) by mouth every 6 (six) hours as needed for cough.    Dispense:  30 capsule    Refill:  1   guaiFENesin (MUCINEX) 600 MG 12 hr tablet    Sig: Take 1 tablet (600 mg total) by mouth 2 (two) times daily.    Dispense:  60 tablet    Refill:  0     Follow up: 2 months for routine follow up    We look forward to seeing you next time. Please call our clinic at [phone] if you have any questions or concerns. The best time to call is Monday-Friday from 9am-4pm, but there is someone available 24/7. If after hours or the weekend, call the main hospital number and ask for the Internal Medicine Resident On-Call. If you need medication refills, please notify your pharmacy one week in advance and they will send [place] a request.   Thank you for trusting me with your care. Wishing you the best!  Lovie Macadamia MD [facility]

## 2023-03-01 NOTE — Progress Notes (Signed)
    Subjective:  CC: Feeling unwell  HPI:  Rita Myers is a 77 y.o. person with a past medical history stated below and presents today for symptoms most consistent with viral URI. Please see problem based assessment and plan for additional details.  Past Medical History:  Diagnosis Date   Aortic insufficiency    Essential hypertension 07/11/2017   Hyperlipemia    Hypertension    Hypokalemia 07/11/2017   Mitral regurgitation    Nausea, vomiting, and diarrhea 07/11/2017   Paroxysmal A-fib (HCC) 07/11/2017   Pulmonary hypertension (HCC)    Syncope, vasovagal 07/11/2017    Current Outpatient Medications on File Prior to Visit  Medication Sig Dispense Refill   apixaban (ELIQUIS) 5 MG TABS tablet Take 1 tablet (5 mg total) by mouth 2 (two) times daily. 60 tablet 11   levothyroxine (SYNTHROID) 25 MCG tablet Take 1 tablet (25 mcg total) by mouth every morning. 90 tablet 3   loratadine (CLARITIN) 10 MG tablet Take 10 mg by mouth daily.     losartan (COZAAR) 100 MG tablet Take 1 tablet (100 mg total) by mouth daily. 90 tablet 3   metoprolol succinate (TOPROL-XL) 25 MG 24 hr tablet Take 1 tablet (25 mg total) by mouth daily. 30 tablet 11   potassium chloride SA (KLOR-CON M) 20 MEQ tablet Take 1 tablet (20 mEq total) by mouth daily. 30 tablet 11   rosuvastatin (CRESTOR) 20 MG tablet Take 1 tablet (20 mg total) by mouth daily.     torsemide 60 MG TABS Take 60 mg by mouth daily.     No current facility-administered medications on file prior to visit.    Review of Systems: Please see assessment and plan for pertinent positives and negatives.  Objective:   Vitals:   03/01/23 1310  BP: (!) 163/75  Pulse: (!) 105  Temp: 98.2 F (36.8 C)  TempSrc: Oral  SpO2: 100%  Weight: 133 lb 11.2 oz (60.6 kg)    Physical Exam: Constitutional: Well-appearing, in no acute distress Cardiovascular: Irregularly irregular, no m/r/g Pulmonary/Chest: Slight and expiratory bibasilar wheezing, no  rales or rhonchi Abdominal: soft, non-tender, non-distended Extremities: Trace lower extremity edema bilaterally ENT: Mild pharyngeal erythema.  Ruptured right tympanic membrane, healing ruptured left tympanic membrane. Skin: warm and dry Psych: Pleasant mood and affect   Assessment & Plan:  Viral sinorhinitis Patient presenting today with a roughly 2-week history of URI symptoms such as cough, congestion and sinus pressure.  She denies fever, chills, night sweats dyspnea, pleuritic chest pain, or sinus pain.  She says she does not feel that her symptoms of significantly improved during this time.  On exam she has a perforated new right tympanic membrane, healing left perforated tympanic membrane.  Her lungs sound clear, there is some bibasilar wheezing.  Presentation most consistent with a viral rhinosinusitis and perhaps subsequent acute bronchitis.  Low concern for bacterial sinusitis or pneumonia at this time. Plan: Symptomatic management with Flonase, Mucinex, Tessalon Perles. Return precautions for bacterial superinfection discussed with the patient.    Patient seen with Dr. Sullivan Lone MD [facility]  PGY-1 Pager: [phone]  Phone: [phone] Date 03/01/2023  Time 10:13 PM

## 2023-03-01 NOTE — Assessment & Plan Note (Signed)
Patient presenting today with a roughly 2-week history of URI symptoms such as cough, congestion and sinus pressure.  She denies fever, chills, night sweats dyspnea, pleuritic chest pain, or sinus pain.  She says she does not feel that her symptoms of significantly improved during this time.  On exam she has a perforated new right tympanic membrane, healing left perforated tympanic membrane.  Her lungs sound clear, there is some bibasilar wheezing.  Presentation most consistent with a viral rhinosinusitis and perhaps subsequent acute bronchitis.  Low concern for bacterial sinusitis or pneumonia at this time. Plan: Symptomatic management with Flonase, Mucinex, Tessalon Perles. Return precautions for bacterial superinfection discussed with the patient.

## 2023-03-02 LAB — COMPREHENSIVE METABOLIC PANEL
ALT: 13 [IU]/L (ref 0–32)
AST: 25 [IU]/L (ref 0–40)
Albumin: 2.7 g/dL — ABNORMAL LOW (ref 3.8–4.8)
Alkaline Phosphatase: 59 [IU]/L (ref 44–121)
BUN/Creatinine Ratio: 34 — ABNORMAL HIGH (ref 12–28)
BUN: 40 mg/dL — ABNORMAL HIGH (ref 8–27)
Bilirubin Total: <0.2 mg/dL (ref 0.0–1.2)
CO2: 25 mmol/L (ref 20–29)
Calcium: 8.2 mg/dL — ABNORMAL LOW (ref 8.7–10.3)
Chloride: 106 mmol/L (ref 96–106)
Creatinine, Ser: 1.18 mg/dL — ABNORMAL HIGH (ref 0.57–1.00)
Globulin, Total: 2.5 g/dL (ref 1.5–4.5)
Glucose: 97 mg/dL (ref 70–99)
Potassium: 4.4 mmol/L (ref 3.5–5.2)
Sodium: 142 mmol/L (ref 134–144)
Total Protein: 5.2 g/dL — ABNORMAL LOW (ref 6.0–8.5)
eGFR: 47 mL/min/{1.73_m2} — ABNORMAL LOW (ref >59)

## 2023-03-02 LAB — LIPID PANEL
Chol/HDL Ratio: 4.7 {ratio} — ABNORMAL HIGH (ref 0.0–4.4)
Cholesterol, Total: 270 mg/dL — ABNORMAL HIGH (ref 100–199)
HDL: 57 mg/dL (ref >39)
LDL Chol Calc (NIH): 165 mg/dL — ABNORMAL HIGH (ref 0–99)
Triglycerides: 259 mg/dL — ABNORMAL HIGH (ref 0–149)
VLDL Cholesterol Cal: 48 mg/dL — ABNORMAL HIGH (ref 5–40)

## 2023-03-02 NOTE — Progress Notes (Signed)
Internal Medicine Clinic Attending  I was physically present during the key portions of the resident provided service and participated in the medical decision making of patient's management care. I reviewed pertinent patient test results.  The assessment, diagnosis, and plan were formulated together and I agree with the documentation in the resident's note.  Erlinda Hong, MD FACP

## 2023-03-06 DIAGNOSIS — I129 Hypertensive chronic kidney disease with stage 1 through stage 4 chronic kidney disease, or unspecified chronic kidney disease: Secondary | ICD-10-CM | POA: Diagnosis not present

## 2023-03-06 DIAGNOSIS — N022 Recurrent and persistent hematuria with diffuse membranous glomerulonephritis: Secondary | ICD-10-CM | POA: Diagnosis not present

## 2023-03-06 DIAGNOSIS — I10 Essential (primary) hypertension: Secondary | ICD-10-CM | POA: Diagnosis not present

## 2023-03-06 DIAGNOSIS — R809 Proteinuria, unspecified: Secondary | ICD-10-CM | POA: Diagnosis not present

## 2023-03-06 DIAGNOSIS — N049 Nephrotic syndrome with unspecified morphologic changes: Secondary | ICD-10-CM | POA: Diagnosis not present

## 2023-03-07 ENCOUNTER — Telehealth: Payer: Self-pay

## 2023-03-07 LAB — LAB REPORT - SCANNED: Creatinine, POC: 18.1 mg/dL

## 2023-03-07 NOTE — Telephone Encounter (Signed)
Spoke with pts daughter. She was notified of lab results. Pts daughter will call back to see if pt is taking Rosuvastatin 20 mg daily. Pt was fasting the day she came in for lab work. Waiting on a return call.

## 2023-03-21 ENCOUNTER — Ambulatory Visit: Payer: Medicare Other

## 2023-03-21 DIAGNOSIS — N022 Recurrent and persistent hematuria with diffuse membranous glomerulonephritis: Secondary | ICD-10-CM | POA: Diagnosis not present

## 2023-03-26 ENCOUNTER — Telehealth: Payer: Self-pay

## 2023-03-26 NOTE — Telephone Encounter (Signed)
Lmom to discuss lab results recommendations and options with Crestor. Pt was having some symptoms and per Rita Person NP,  pt can try decreasing Crestor to 5 mg daily in this case to see if her symptoms in her fingers improve.  BP appears stable overall.  Continue to monitor symptoms/BP.  Otherwise, continue current medications and follow-up as planned. Waiting on a return call.

## 2023-03-31 DIAGNOSIS — R0981 Nasal congestion: Secondary | ICD-10-CM | POA: Diagnosis not present

## 2023-03-31 DIAGNOSIS — J208 Acute bronchitis due to other specified organisms: Secondary | ICD-10-CM | POA: Diagnosis not present

## 2023-03-31 DIAGNOSIS — R051 Acute cough: Secondary | ICD-10-CM | POA: Diagnosis not present

## 2023-04-16 NOTE — Telephone Encounter (Signed)
PAP: Patient application pending due to oop spending for 2024 pt states will try for next year    Please be advised

## 2023-05-02 DIAGNOSIS — N022 Recurrent and persistent hematuria with diffuse membranous glomerulonephritis: Secondary | ICD-10-CM | POA: Diagnosis not present

## 2023-05-27 ENCOUNTER — Other Ambulatory Visit: Payer: Self-pay | Admitting: Student

## 2023-05-28 NOTE — Telephone Encounter (Signed)
 Medication sent to pharmacy

## 2023-05-31 DIAGNOSIS — I129 Hypertensive chronic kidney disease with stage 1 through stage 4 chronic kidney disease, or unspecified chronic kidney disease: Secondary | ICD-10-CM | POA: Diagnosis not present

## 2023-06-07 DIAGNOSIS — R809 Proteinuria, unspecified: Secondary | ICD-10-CM | POA: Diagnosis not present

## 2023-06-07 DIAGNOSIS — I129 Hypertensive chronic kidney disease with stage 1 through stage 4 chronic kidney disease, or unspecified chronic kidney disease: Secondary | ICD-10-CM | POA: Diagnosis not present

## 2023-06-07 DIAGNOSIS — I10 Essential (primary) hypertension: Secondary | ICD-10-CM | POA: Diagnosis not present

## 2023-06-07 DIAGNOSIS — N022 Recurrent and persistent hematuria with diffuse membranous glomerulonephritis: Secondary | ICD-10-CM | POA: Diagnosis not present

## 2023-06-07 DIAGNOSIS — N049 Nephrotic syndrome with unspecified morphologic changes: Secondary | ICD-10-CM | POA: Diagnosis not present

## 2023-06-08 ENCOUNTER — Other Ambulatory Visit: Payer: Self-pay | Admitting: Student

## 2023-06-08 DIAGNOSIS — I480 Paroxysmal atrial fibrillation: Secondary | ICD-10-CM

## 2023-06-08 DIAGNOSIS — I10 Essential (primary) hypertension: Secondary | ICD-10-CM

## 2023-06-08 DIAGNOSIS — E785 Hyperlipidemia, unspecified: Secondary | ICD-10-CM

## 2023-06-08 DIAGNOSIS — N042 Nephrotic syndrome with diffuse membranous glomerulonephritis, unspecified: Secondary | ICD-10-CM

## 2023-06-08 NOTE — Telephone Encounter (Signed)
Pharmacy requesting a 90 day supply

## 2023-06-15 ENCOUNTER — Telehealth: Payer: Self-pay | Admitting: Student

## 2023-06-15 DIAGNOSIS — N045 Nephrotic syndrome with diffuse mesangiocapillary glomerulonephritis: Secondary | ICD-10-CM

## 2023-06-15 DIAGNOSIS — E7800 Pure hypercholesterolemia, unspecified: Secondary | ICD-10-CM

## 2023-06-15 MED ORDER — ROSUVASTATIN CALCIUM 5 MG PO TABS
5.0000 mg | ORAL_TABLET | Freq: Every day | ORAL | 11 refills | Status: DC
Start: 2023-06-15 — End: 2023-07-06

## 2023-06-15 NOTE — Telephone Encounter (Signed)
Trial of lower dose statin given discomfort. Patient has follow up at [facility] on 2/17; please follow up symptoms.

## 2023-06-25 ENCOUNTER — Other Ambulatory Visit: Payer: Self-pay | Admitting: Nurse Practitioner

## 2023-06-25 DIAGNOSIS — N042 Nephrotic syndrome with diffuse membranous glomerulonephritis, unspecified: Secondary | ICD-10-CM

## 2023-06-25 DIAGNOSIS — I10 Essential (primary) hypertension: Secondary | ICD-10-CM

## 2023-07-02 ENCOUNTER — Encounter: Payer: Medicare Other | Admitting: Student

## 2023-07-04 ENCOUNTER — Other Ambulatory Visit: Payer: Self-pay | Admitting: Student

## 2023-07-04 DIAGNOSIS — I480 Paroxysmal atrial fibrillation: Secondary | ICD-10-CM

## 2023-07-04 DIAGNOSIS — N042 Nephrotic syndrome with diffuse membranous glomerulonephritis, unspecified: Secondary | ICD-10-CM

## 2023-07-04 DIAGNOSIS — E785 Hyperlipidemia, unspecified: Secondary | ICD-10-CM

## 2023-07-06 ENCOUNTER — Other Ambulatory Visit: Payer: Self-pay | Admitting: Student

## 2023-07-06 ENCOUNTER — Ambulatory Visit: Payer: Medicare Other | Admitting: Student

## 2023-07-06 VITALS — BP 149/68 | HR 98 | Temp 98.3°F | Ht <= 58 in | Wt 137.0 lb

## 2023-07-06 DIAGNOSIS — I480 Paroxysmal atrial fibrillation: Secondary | ICD-10-CM | POA: Diagnosis not present

## 2023-07-06 DIAGNOSIS — E7800 Pure hypercholesterolemia, unspecified: Secondary | ICD-10-CM

## 2023-07-06 DIAGNOSIS — I10 Essential (primary) hypertension: Secondary | ICD-10-CM | POA: Diagnosis not present

## 2023-07-06 DIAGNOSIS — E1122 Type 2 diabetes mellitus with diabetic chronic kidney disease: Secondary | ICD-10-CM

## 2023-07-06 DIAGNOSIS — E119 Type 2 diabetes mellitus without complications: Secondary | ICD-10-CM

## 2023-07-06 DIAGNOSIS — R7303 Prediabetes: Secondary | ICD-10-CM

## 2023-07-06 DIAGNOSIS — M1712 Unilateral primary osteoarthritis, left knee: Secondary | ICD-10-CM | POA: Insufficient documentation

## 2023-07-06 LAB — POCT GLYCOSYLATED HEMOGLOBIN (HGB A1C): Hemoglobin A1C: 6.7 % — AB (ref 4.0–5.6)

## 2023-07-06 LAB — GLUCOSE, CAPILLARY: Glucose-Capillary: 116 mg/dL — ABNORMAL HIGH (ref 70–99)

## 2023-07-06 MED ORDER — APIXABAN 5 MG PO TABS: 5.0000 mg | ORAL_TABLET | Freq: Two times a day (BID) | ORAL | 3 refills | Status: DC

## 2023-07-06 MED ORDER — PRAVASTATIN SODIUM 20 MG PO TABS: ORAL_TABLET | ORAL | 1 refills | Status: DC

## 2023-07-06 NOTE — Assessment & Plan Note (Signed)
 Lipid panel rechecked in 02/2023 with LDL up to 165 from 114 previously after decreasing rosuvastatin from 20 mg daily to 10 mg daily due to myalgias.  Discussed changing to pravastatin and they are agreeable. - Start pravastatin 20 mg daily with plans to uptitrate to 40 mg daily after 2 weeks - Repeat lipid panel in 3 months, consider adding ezetimibe and if still not controlled and unable to go up further on pravastatin

## 2023-07-06 NOTE — Assessment & Plan Note (Signed)
 Patient has chronic mild pain from left knee osteoarthritis with last knee x-ray in January 2024 showing moderate to severe tricompartmental degenerative change in the left knee.  Exam today consistent with osteoarthritis and the patient is not having any instability on this knee. - As needed Voltaren gel, regular weightbearing exercise, ice/heat as needed, and Tylenol 1000 mg every 8 hours as needed

## 2023-07-06 NOTE — Progress Notes (Signed)
   CC: Routine Follow Up for HTN and HLD after last office visit 03/01/2023  HPI:  Rita Myers is a 78 y.o. female with pertinent PMH of HTN, prediabetes, HLD, paroxysmal A-fib on Eliquis, HFrEF, and hypothyroidism who presents as above. Please see assessment and plan below for further details.  Review of Systems:   Pertinent items noted in HPI and/or A&P.  Physical Exam:  Vitals:   07/06/23 1013  BP: (!) 149/68  Pulse: 98  Temp: 98.3 F (36.8 C)  TempSrc: Oral  SpO2: 100%  Weight: 137 lb (62.1 kg)  Height: 4\' 8"  (1.422 m)    Constitutional: Well-appearing elderly female. In no acute distress. HEENT: Normocephalic, atraumatic, Sclera non-icteric, PERRL, EOM intact Cardio:Regular rate and rhythm. 2+ bilateral radial pulses. Pulm:Clear to auscultation bilaterally. Normal work of breathing on room air. Abdomen: Soft, non-tender, non-distended, positive bowel sounds. ZOX:WRUEAVWU for extremity edema.  Left knee with crepitus and normal strength and stability testing. Skin:Warm and dry. Neuro:Alert and oriented x3. No focal deficit noted. Psych:Pleasant mood and affect.   Assessment & Plan:   Paroxysmal atrial fibrillation [facility]) Patient continues to do well on metoprolol succinate 25 mg daily and Eliquis 5 mg twice daily.  Patient in normal sinus rhythm today. - Continue metoprolol 25 mg daily and Eliquis 5 mg twice daily  Essential hypertension Blood pressure today elevated at 149/68 however home blood pressure readings have consistently been 125-135 systolic and below 90 diastolic. - Continue losartan 981 mg daily, metoprolol succinate 25 mg daily, and torsemide 60 mg daily - Check metabolic panel at next visit  Pure hypercholesterolemia Lipid panel rechecked in 02/2023 with LDL up to 165 from 114 previously after decreasing rosuvastatin from 20 mg daily to 10 mg daily due to myalgias.  Discussed changing to pravastatin and they are agreeable. - Start pravastatin 20 mg  daily with plans to uptitrate to 40 mg daily after 2 weeks - Repeat lipid panel in 3 months, consider adding ezetimibe and if still not controlled and unable to go up further on pravastatin  Diabetes (HCC) A1c today is up from 6.1 last year to 6.7.  Discussed lifestyle modifications and medications with their risk, benefits, and alternatives.  The patient is eating some sugary desserts often and would like to try lifestyle modifications before starting medication.  Discussed limiting sugar intake, balanced diet, and regular exercise. - Lifestyle modifications and return in 3 months to repeat A1c  Osteoarthritis of left knee Patient has chronic mild pain from left knee osteoarthritis with last knee x-ray in January 2024 showing moderate to severe tricompartmental degenerative change in the left knee.  Exam today consistent with osteoarthritis and the patient is not having any instability on this knee. - As needed Voltaren gel, regular weightbearing exercise, ice/heat as needed, and Tylenol 1000 mg every 8 hours as needed    Patient discussed with Dr. Theodosia Paling, DO Internal Medicine Center Internal Medicine Resident PGY-2 Clinic Phone: [phone] Pager: [phone]

## 2023-07-06 NOTE — Patient Instructions (Signed)
  Thank you, Ms.Ninette Morriss, for allowing [place] to provide your care today. Today we discussed . . .  > Cholesterol       -We are going to replace the rosuvastatin with pravastatin as this medication has less risk of causing the muscle pains.  Please start taking 20 mg daily for about 2 weeks and then increase to 40 mg daily after that.  If you do not have any muscle pains on this 40 mg dose please let us know and we will continue refilling at that dose.  We will recheck your lipid panel at your next visit in about 3 months. > Diabetes       -As we discussed your A1c is now in the diabetic range at 6.7.  I think that working on lifestyle modifications like cutting sugar out wherever you are able to, having a balanced diet, and exercising as tolerated with a goal of 150 minutes of moderate exercise a week could work to bring this back down.  We will recheck your A1c in about 3 months and if it is not going back down we will talk about starting medications. > Left knee pain       -For your left knee pain I would like you to try Voltaren (diclofenac) gel which she can use up to 4 times a day, continue with weightbearing exercise as tolerated, use ice or heat as needed, and you can take Tylenol 1000 mg every 8 hours as needed for pain.  If you have any new or worsening symptoms associated with this knee please let us know.   I have ordered the following labs for you:   Lab Orders         Microalbumin / Creatinine Urine Ratio         Glucose, capillary         POC Hbg A1C       Follow up: 3 months    Remember:     Should you have any questions or concerns please call the internal medicine clinic at [phone].     Rocky Morel, DO [facility]

## 2023-07-06 NOTE — Assessment & Plan Note (Signed)
 Patient continues to do well on metoprolol succinate 25 mg daily and Eliquis 5 mg twice daily.  Patient in normal sinus rhythm today. - Continue metoprolol 25 mg daily and Eliquis 5 mg twice daily

## 2023-07-06 NOTE — Assessment & Plan Note (Signed)
 Blood pressure today elevated at 149/68 however home blood pressure readings have consistently been 125-135 systolic and below 90 diastolic. - Continue losartan 540 mg daily, metoprolol succinate 25 mg daily, and torsemide 60 mg daily - Check metabolic panel at next visit

## 2023-07-06 NOTE — Assessment & Plan Note (Signed)
 A1c today is up from 6.1 last year to 6.7.  Discussed lifestyle modifications and medications with their risk, benefits, and alternatives.  The patient is eating some sugary desserts often and would like to try lifestyle modifications before starting medication.  Discussed limiting sugar intake, balanced diet, and regular exercise. - Lifestyle modifications and return in 3 months to repeat A1c

## 2023-07-07 ENCOUNTER — Other Ambulatory Visit: Payer: Self-pay | Admitting: Student

## 2023-07-07 DIAGNOSIS — I480 Paroxysmal atrial fibrillation: Secondary | ICD-10-CM

## 2023-07-08 LAB — MICROALBUMIN / CREATININE URINE RATIO
Creatinine, Urine: 18.5 mg/dL
Microalb/Creat Ratio: 5026 mg/g{creat} — ABNORMAL HIGH (ref 0–29)
Microalbumin, Urine: 929.8 ug/mL

## 2023-07-09 ENCOUNTER — Telehealth: Payer: Self-pay | Admitting: *Deleted

## 2023-07-09 NOTE — Telephone Encounter (Signed)
 Call from patient's daughter stated that prescription for Eliquis was not received by the Pharmacy.  Prescription was called in per Dr. Geraldo Pitter per daughter at patient's visit. Not received. Call to Pharmacy not received.  Prescription for Eliquis 5 mg  take 1 tablet 2 times daily was called to the [employer] dispense [phone] with 3 refills.

## 2023-07-10 ENCOUNTER — Other Ambulatory Visit: Payer: Self-pay | Admitting: Student

## 2023-07-10 DIAGNOSIS — I480 Paroxysmal atrial fibrillation: Secondary | ICD-10-CM

## 2023-07-10 MED ORDER — APIXABAN 5 MG PO TABS: 5.0000 mg | ORAL_TABLET | Freq: Two times a day (BID) | ORAL | 3 refills | Status: DC

## 2023-07-12 ENCOUNTER — Encounter: Payer: Self-pay | Admitting: Student

## 2023-07-12 NOTE — Progress Notes (Signed)
 Microalbumin to creatinine ratio elevated at 5000 but improved from 10/2022 when it was 11,000.  She has a history of nephrotic syndrome with MPGN and is followed by nephrology.  No changes to current management.

## 2023-07-13 NOTE — Progress Notes (Signed)
 Internal Medicine Clinic Attending  Case discussed with the resident at the time of the visit.  We reviewed the resident's history and exam and pertinent patient test results.  I agree with the assessment, diagnosis, and plan of care documented in the resident's note.

## 2023-07-19 ENCOUNTER — Ambulatory Visit: Payer: Medicare Other | Admitting: Cardiology

## 2023-07-19 ENCOUNTER — Telehealth (INDEPENDENT_AMBULATORY_CARE_PROVIDER_SITE_OTHER): Payer: Self-pay | Admitting: Otolaryngology

## 2023-07-19 DIAGNOSIS — I129 Hypertensive chronic kidney disease with stage 1 through stage 4 chronic kidney disease, or unspecified chronic kidney disease: Secondary | ICD-10-CM | POA: Diagnosis not present

## 2023-07-19 NOTE — Telephone Encounter (Signed)
 LVM to confirm appt & location [id number] afm

## 2023-07-20 ENCOUNTER — Ambulatory Visit (INDEPENDENT_AMBULATORY_CARE_PROVIDER_SITE_OTHER): Payer: Medicare Other

## 2023-07-20 ENCOUNTER — Encounter (INDEPENDENT_AMBULATORY_CARE_PROVIDER_SITE_OTHER): Payer: Self-pay

## 2023-07-20 ENCOUNTER — Ambulatory Visit (INDEPENDENT_AMBULATORY_CARE_PROVIDER_SITE_OTHER): Payer: Medicare Other | Admitting: Otolaryngology

## 2023-07-20 VITALS — BP 169/72 | HR 96 | Ht <= 58 in | Wt 132.0 lb

## 2023-07-20 DIAGNOSIS — H903 Sensorineural hearing loss, bilateral: Secondary | ICD-10-CM | POA: Diagnosis not present

## 2023-07-20 DIAGNOSIS — H9012 Conductive hearing loss, unilateral, left ear, with unrestricted hearing on the contralateral side: Secondary | ICD-10-CM | POA: Diagnosis not present

## 2023-07-20 DIAGNOSIS — H6122 Impacted cerumen, left ear: Secondary | ICD-10-CM

## 2023-07-20 DIAGNOSIS — H7202 Central perforation of tympanic membrane, left ear: Secondary | ICD-10-CM

## 2023-07-23 DIAGNOSIS — H903 Sensorineural hearing loss, bilateral: Secondary | ICD-10-CM | POA: Insufficient documentation

## 2023-07-23 DIAGNOSIS — H7202 Central perforation of tympanic membrane, left ear: Secondary | ICD-10-CM | POA: Insufficient documentation

## 2023-07-23 DIAGNOSIS — H6122 Impacted cerumen, left ear: Secondary | ICD-10-CM | POA: Insufficient documentation

## 2023-07-23 NOTE — Progress Notes (Signed)
 Patient ID: Rita Myers, female   DOB: August 06, 1945, 78 y.o.   MRN: [id number]  Follow up: Chronic left TM perforation, left ear pain  HPI: The patient is a 78 year old female who presents today complaining of recurrent left ear drainage and itchy sensation.  The patient was last seen 6 months ago.  At that time, she was noted to have a small left tympanic membrane perforation, with inflamed left tympanic membrane.  She was treated with Ciprodex eardrops.  According to the patient, she has been experiencing intermittent left ear drainage and itchy sensation for the past few months.  She denies any vertigo.  Exam: General: Communicates without difficulty, well nourished, no acute distress. Head: Normocephalic, no evidence injury, no tenderness, facial buttresses intact without stepoff. Eyes: PERRL, EOMI. No scleral icterus, conjunctivae clear. Neuro: CN II exam reveals vision grossly intact. No nystagmus at any point of gaze. Ear: Monomeric right TM.  Left ear cerumen impaction.  Nose: External evaluation reveals normal support and skin without lesions. Dorsum is intact. Anterior rhinoscopy reveals healthy pink mucosa over anterior aspect of inferior turbinates and intact septum. No purulence noted. Oral:  Oral cavity and oropharynx are intact, symmetric, without erythema or edema. Mucosa is moist without lesions. Neck: Full range of motion without pain. There is no significant lymphadenopathy. No masses palpable. Thyroid bed within normal limits to palpation. Parotid glands and submandibular glands equal bilaterally without mass. Trachea is midline. Neuro:  CN 2-12 grossly intact. Gait normal. Vestibular: No nystagmus at any point of gaze. The cerebellar examination is unremarkable.   Procedure: Left ear cerumen disimpaction Anesthesia: None Description: Under the operating microscope, the cerumen is carefully removed with a combination of cerumen currette, alligator forceps, and suction catheters.  After  the cerumen is removed, a small left tympanic membrane perforation is noted, with surrounding polypoid tissue.  No mass, erythema, or lesions. The patient tolerated the procedure well.    Assessment: 1. The right TM is intact with a monomeric membrane.  2.  Left ear cerumen impaction.  After the disimpaction procedure, a small left TM perforation is noted, with surrounding polypoid tissue. 3. 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   Plan: 1.  Otomicroscopy with left ear cerumen disimpaction. 2.  Ciprodex eardrops 4 drops left ear once a week for infection prophylaxis. 3.  Dry ear precaution on the left side. 4.  The patient will return for reevaluation in 6 months, sooner if needed.

## 2023-07-24 DIAGNOSIS — I10 Essential (primary) hypertension: Secondary | ICD-10-CM | POA: Diagnosis not present

## 2023-07-24 DIAGNOSIS — N049 Nephrotic syndrome with unspecified morphologic changes: Secondary | ICD-10-CM | POA: Diagnosis not present

## 2023-07-24 DIAGNOSIS — R809 Proteinuria, unspecified: Secondary | ICD-10-CM | POA: Diagnosis not present

## 2023-07-24 DIAGNOSIS — N022 Recurrent and persistent hematuria with diffuse membranous glomerulonephritis: Secondary | ICD-10-CM | POA: Diagnosis not present

## 2023-07-24 DIAGNOSIS — I129 Hypertensive chronic kidney disease with stage 1 through stage 4 chronic kidney disease, or unspecified chronic kidney disease: Secondary | ICD-10-CM | POA: Diagnosis not present

## 2023-07-31 ENCOUNTER — Ambulatory Visit: Payer: Medicare Other | Admitting: Cardiology

## 2023-08-02 ENCOUNTER — Other Ambulatory Visit: Payer: Self-pay | Admitting: Student

## 2023-08-02 DIAGNOSIS — E7800 Pure hypercholesterolemia, unspecified: Secondary | ICD-10-CM

## 2023-08-16 ENCOUNTER — Other Ambulatory Visit: Payer: Self-pay | Admitting: Student

## 2023-08-16 DIAGNOSIS — E7800 Pure hypercholesterolemia, unspecified: Secondary | ICD-10-CM

## 2023-08-16 NOTE — Telephone Encounter (Signed)
 Pharmacy is requesting a 90 day supply  Medication sent to pharmacy.

## 2023-09-21 ENCOUNTER — Other Ambulatory Visit: Payer: Medicare Other

## 2023-09-21 NOTE — Progress Notes (Signed)
 HPI: Follow-up paroxysmal atrial fibrillation, cardiomyopathy, history of syncope, hypertension, hyperlipidemia and myxomatous mitral valve with mitral regurgitation.  Previously followed by Dr. Felipe Horton.  Hospitalized with volume excess January 2024.  Diagnosed with nephrotic syndrome.  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  Echocardiogram January 2024 showed ejection fraction 40 to 45%, grade 2 diastolic dysfunction, mild left atrial enlargement, small pericardial effusion, mild mitral regurgitation, trace aortic insufficiency.  MRA of the neck February 2024 showed no significant stenosis.  Monitor March 2024 showed sinus rhythm with PACs, brief PAT and 5 beats nonsustained ventricular tachycardia.  Since last seen she denies dyspnea, chest pain, palpitations, syncope or bleeding.  Current Outpatient Medications  Medication Sig Dispense Refill   apixaban  (ELIQUIS ) 5 MG TABS tablet Take 1 tablet (5 mg total) by mouth 2 (two) times daily. 120 tablet 3   fluticasone  (FLONASE ) 50 MCG/ACT nasal spray SHAKE LIQUID AND USE 2 SPRAYS IN EACH NOSTRIL DAILY 16 g 3   levothyroxine  (SYNTHROID ) 25 MCG tablet Take 1 tablet (25 mcg total) by mouth every morning. 90 tablet 3   loratadine  (CLARITIN ) 10 MG tablet Take 10 mg by mouth daily.     losartan  (COZAAR ) 100 MG tablet TAKE 1 TABLET(100 MG) BY MOUTH DAILY 90 tablet 2   metoprolol  succinate (TOPROL -XL) 25 MG 24 hr tablet TAKE 1 TABLET(25 MG) BY MOUTH DAILY 90 tablet 3   potassium chloride  SA (KLOR-CON  M) 20 MEQ tablet TAKE 1 TABLET(20 MEQ) BY MOUTH DAILY 90 tablet 3   pravastatin  (PRAVACHOL ) 20 MG tablet TAKE 1 TABLET(20 MG) BY MOUTH DAILY FOR 14 DAYS THEN TAKE 2 TABLETS(40 MG) BY MOUTH DAILY 139 tablet 1   torsemide  60 MG TABS Take 60 mg by mouth daily.     No current facility-administered medications for this visit.     Past Medical History:  Diagnosis Date   Aortic insufficiency    Essential hypertension  07/11/2017   Hyperlipemia    Hypertension    Hypokalemia 07/11/2017   Mitral regurgitation    Nausea, vomiting, and diarrhea 07/11/2017   Paroxysmal A-fib (HCC) 07/11/2017   Pulmonary hypertension (HCC)    Syncope, vasovagal 07/11/2017    History reviewed. No pertinent surgical history.  Social History   Socioeconomic History   Marital status: Married    Spouse name: Not on file   Number of children: Not on file   Years of education: Not on file   Highest education level: Not on file  Occupational History   Not on file  Tobacco Use   Smoking status: Never   Smokeless tobacco: Never  Vaping Use   Vaping status: Never Used  Substance and Sexual Activity   Alcohol  use: No   Drug use: No   Sexual activity: Not on file  Other Topics Concern   Not on file  Social History Narrative   Not on file   Social Drivers of Health   Financial Resource Strain: Low Risk  (10/26/2022)   Overall Financial Resource Strain (CARDIA)    Difficulty of Paying Living Expenses: Not very hard  Food Insecurity: No Food Insecurity (10/26/2022)   Hunger Vital Sign    Worried About Running Out of Food in the Last Year: Never true    Ran Out of Food in the Last Year: Never true  Transportation Needs: No Transportation Needs (10/26/2022)   PRAPARE - Administrator, Civil Service (Medical): No    Lack of Transportation (Non-Medical):  No  Physical Activity: Not on file  Stress: Stress Concern Present (10/26/2022)   [employer] of Occupational Health - Occupational Stress Questionnaire    Feeling of Stress : To some extent  Social Connections: Not on file  Intimate Partner Violence: Not At Risk (05/19/2022)   Humiliation, Afraid, Rape, and Kick questionnaire    Fear of Current or Ex-Partner: No    Emotionally Abused: No    Physically Abused: No    Sexually Abused: No    Family History  Problem Relation Age of Onset   Diabetes Mother    Diabetes Sister    Diabetes Brother      ROS: no fevers or chills, productive cough, hemoptysis, dysphasia, odynophagia, melena, hematochezia, dysuria, hematuria, rash, seizure activity, orthopnea, PND, pedal edema, claudication. Remaining systems are negative.  Physical Exam: Well-developed well-nourished in no acute distress.  Skin is warm and dry.  HEENT is normal.  Neck is supple.  Chest is clear to auscultation with normal expansion.  Cardiovascular exam is regular rate and rhythm.  Abdominal exam nontender or distended. No masses palpated. Extremities show no edema. neuro grossly intact  EKG Interpretation Date/Time:  Thursday Oct 04 2023 11:33:14 EDT Ventricular Rate:  96 PR Interval:  148 QRS Duration:  82 QT Interval:  366 QTC Calculation: 462 R Axis:   -3  Text Interpretation: Normal sinus rhythm Normal ECG Confirmed by Alexandria Angel ([id number]) on 10/04/2023 11:34:48 AM    A/P  1 prior history of syncope-no recurrent episodes since last office visit.  Previous episode felt secondary to dehydration.  Will continue to follow.  2 history of cardiomyopathy-we will repeat echocardiogram to reassess LV function.  May have previously been decreased due to atrial fibrillation.  3 paroxysmal atrial fibrillation-patient is in sinus rhythm today.  Continue metoprolol  and apixaban .  Check hemoglobin and renal function.  4 hypertension-patient's blood pressure is controlled.  Continue present medical regimen.  5 mitral regurgitation-mild on most recent echocardiogram.  6 hyperlipidemia-continue statin.  Check lipids and liver.  7 history of nephrotic syndrome-patient is followed by nephrology.  Alexandria Angel, MD

## 2023-10-04 ENCOUNTER — Ambulatory Visit: Payer: Medicare Other | Attending: Cardiology | Admitting: Cardiology

## 2023-10-04 ENCOUNTER — Encounter: Payer: Self-pay | Admitting: Cardiology

## 2023-10-04 VITALS — BP 140/68 | HR 103 | Ht <= 58 in | Wt 134.4 lb

## 2023-10-04 DIAGNOSIS — I480 Paroxysmal atrial fibrillation: Secondary | ICD-10-CM

## 2023-10-04 DIAGNOSIS — I10 Essential (primary) hypertension: Secondary | ICD-10-CM

## 2023-10-04 DIAGNOSIS — I429 Cardiomyopathy, unspecified: Secondary | ICD-10-CM | POA: Diagnosis not present

## 2023-10-04 DIAGNOSIS — E782 Mixed hyperlipidemia: Secondary | ICD-10-CM

## 2023-10-04 NOTE — Patient Instructions (Signed)
 Medication Instructions:   NO CHANGES  *If you need a refill on your cardiac medications before your next appointment, please call your pharmacy*  Lab Work:  Your physician recommends that you HAVE LAB WORK TODAY  If you have labs (blood work) drawn today and your tests are completely normal, you will receive your results only by: MyChart Message (if you have MyChart) OR A paper copy in the mail If you have any lab test that is abnormal or we need to change your treatment, we will call you to review the results.  Testing/Procedures:  Your physician has requested that you have an echocardiogram. Echocardiography is a painless test that uses sound waves to create images of your heart. It provides your doctor with information about the size and shape of your heart and how well your heart's chambers and valves are working. This procedure takes approximately one hour. There are no restrictions for this procedure. Please do NOT wear cologne, perfume, aftershave, or lotions (deodorant is allowed). Please arrive 15 minutes prior to your appointment time.  Please note: We ask at that you not bring children with you during ultrasound (echo/ vascular) testing. Due to room size and safety concerns, children are not allowed in the ultrasound rooms during exams. Our front office staff cannot provide observation of children in our lobby area while testing is being conducted. An adult accompanying a patient to their appointment will only be allowed in the ultrasound room at the discretion of the ultrasound technician under special circumstances. We apologize for any inconvenience. [address]LO[address]  Follow-Up: At [facility], you and your health needs are our priority.  As part of our continuing mission to provide you with exceptional heart care, our providers are all part of one team.  This team includes your primary Cardiologist (physician) and Advanced Practice Providers or APPs  (Physician Assistants and Nurse Practitioners) who all work together to provide you with the care you need, when you need it.  Your next appointment:   6 month(s)  Provider:   Alexandria Angel, MD

## 2023-10-05 ENCOUNTER — Ambulatory Visit: Payer: Self-pay | Admitting: Cardiology

## 2023-10-05 DIAGNOSIS — E782 Mixed hyperlipidemia: Secondary | ICD-10-CM

## 2023-10-05 LAB — LIPID PANEL
Chol/HDL Ratio: 5.6 ratio — ABNORMAL HIGH (ref 0.0–4.4)
Cholesterol, Total: 282 mg/dL — ABNORMAL HIGH (ref 100–199)
HDL: 50 mg/dL (ref >39)
LDL Chol Calc (NIH): 148 mg/dL — ABNORMAL HIGH (ref 0–99)
Triglycerides: 446 mg/dL — ABNORMAL HIGH (ref 0–149)
VLDL Cholesterol Cal: 84 mg/dL — ABNORMAL HIGH (ref 5–40)

## 2023-10-05 LAB — COMPREHENSIVE METABOLIC PANEL WITH GFR
ALT: 15 IU/L (ref 0–32)
AST: 26 IU/L (ref 0–40)
Albumin: 3.7 g/dL — ABNORMAL LOW (ref 3.8–4.8)
Alkaline Phosphatase: 70 IU/L (ref 44–121)
BUN/Creatinine Ratio: 30 — ABNORMAL HIGH (ref 12–28)
BUN: 39 mg/dL — ABNORMAL HIGH (ref 8–27)
Bilirubin Total: <0.2 mg/dL (ref 0.0–1.2)
CO2: 21 mmol/L (ref 20–29)
Calcium: 9.4 mg/dL (ref 8.7–10.3)
Chloride: 104 mmol/L (ref 96–106)
Creatinine, Ser: 1.32 mg/dL — ABNORMAL HIGH (ref 0.57–1.00)
Globulin, Total: 2.5 g/dL (ref 1.5–4.5)
Glucose: 168 mg/dL — ABNORMAL HIGH (ref 70–99)
Potassium: 5.1 mmol/L (ref 3.5–5.2)
Sodium: 144 mmol/L (ref 134–144)
Total Protein: 6.2 g/dL (ref 6.0–8.5)
eGFR: 41 mL/min/{1.73_m2} — ABNORMAL LOW (ref >59)

## 2023-10-05 LAB — CBC
Hematocrit: 33.3 % — ABNORMAL LOW (ref 34.0–46.6)
Hemoglobin: 11.2 g/dL (ref 11.1–15.9)
MCH: 30.8 pg (ref 26.6–33.0)
MCHC: 33.6 g/dL (ref 31.5–35.7)
MCV: 92 fL (ref 79–97)
Platelets: 164 10*3/uL (ref 150–450)
RBC: 3.64 x10E6/uL — ABNORMAL LOW (ref 3.77–5.28)
RDW: 12.3 % (ref 11.7–15.4)
WBC: 6 10*3/uL (ref 3.4–10.8)

## 2023-10-10 MED ORDER — ROSUVASTATIN CALCIUM 40 MG PO TABS: 40.0000 mg | ORAL_TABLET | Freq: Every day | ORAL | 3 refills | Status: AC

## 2023-10-16 DIAGNOSIS — N189 Chronic kidney disease, unspecified: Secondary | ICD-10-CM | POA: Diagnosis not present

## 2023-10-23 DIAGNOSIS — N049 Nephrotic syndrome with unspecified morphologic changes: Secondary | ICD-10-CM | POA: Diagnosis not present

## 2023-10-23 DIAGNOSIS — R809 Proteinuria, unspecified: Secondary | ICD-10-CM | POA: Diagnosis not present

## 2023-10-23 DIAGNOSIS — N1831 Chronic kidney disease, stage 3a: Secondary | ICD-10-CM | POA: Diagnosis not present

## 2023-10-23 DIAGNOSIS — I10 Essential (primary) hypertension: Secondary | ICD-10-CM | POA: Diagnosis not present

## 2023-10-23 DIAGNOSIS — N022 Recurrent and persistent hematuria with diffuse membranous glomerulonephritis: Secondary | ICD-10-CM | POA: Diagnosis not present

## 2023-10-27 ENCOUNTER — Other Ambulatory Visit: Payer: Self-pay | Admitting: Student

## 2023-10-27 DIAGNOSIS — E039 Hypothyroidism, unspecified: Secondary | ICD-10-CM

## 2023-10-29 NOTE — Telephone Encounter (Signed)
 Medication sent to pharmacy

## 2023-11-08 ENCOUNTER — Ambulatory Visit (HOSPITAL_COMMUNITY)
Admission: RE | Admit: 2023-11-08 | Discharge: 2023-11-08 | Disposition: A | Discharge status: Home / Self Care | Source: Ambulatory Visit | Attending: Cardiology | Admitting: Cardiology

## 2023-11-08 DIAGNOSIS — I4891 Unspecified atrial fibrillation: Secondary | ICD-10-CM | POA: Diagnosis not present

## 2023-11-08 DIAGNOSIS — E785 Hyperlipidemia, unspecified: Secondary | ICD-10-CM | POA: Insufficient documentation

## 2023-11-08 DIAGNOSIS — I080 Rheumatic disorders of both mitral and aortic valves: Secondary | ICD-10-CM | POA: Insufficient documentation

## 2023-11-08 DIAGNOSIS — I10 Essential (primary) hypertension: Secondary | ICD-10-CM

## 2023-11-08 DIAGNOSIS — I3139 Other pericardial effusion (noninflammatory): Secondary | ICD-10-CM | POA: Insufficient documentation

## 2023-11-08 DIAGNOSIS — E119 Type 2 diabetes mellitus without complications: Secondary | ICD-10-CM | POA: Insufficient documentation

## 2023-11-08 DIAGNOSIS — I429 Cardiomyopathy, unspecified: Secondary | ICD-10-CM | POA: Diagnosis not present

## 2023-11-08 LAB — ECHOCARDIOGRAM COMPLETE
AR max vel: 2.21 cm2
AV Area VTI: 2.03 cm2
AV Area mean vel: 1.9 cm2
AV Mean grad: 6 mmHg
AV Peak grad: 9.7 mmHg
Ao pk vel: 1.56 m/s
Area-P 1/2: 3.85 cm2
MV M vel: 5.26 m/s
MV Peak grad: 110.7 mmHg
S' Lateral: 3.11 cm

## 2023-11-13 ENCOUNTER — Other Ambulatory Visit: Payer: Self-pay | Admitting: *Deleted

## 2023-11-13 DIAGNOSIS — I340 Nonrheumatic mitral (valve) insufficiency: Secondary | ICD-10-CM

## 2023-11-17 ENCOUNTER — Other Ambulatory Visit: Payer: Self-pay | Admitting: Student

## 2023-11-17 DIAGNOSIS — E7800 Pure hypercholesterolemia, unspecified: Secondary | ICD-10-CM

## 2023-11-20 DIAGNOSIS — N1831 Chronic kidney disease, stage 3a: Secondary | ICD-10-CM | POA: Diagnosis not present

## 2023-12-16 DIAGNOSIS — H2513 Age-related nuclear cataract, bilateral: Secondary | ICD-10-CM | POA: Diagnosis not present

## 2023-12-25 DIAGNOSIS — E782 Mixed hyperlipidemia: Secondary | ICD-10-CM | POA: Diagnosis not present

## 2023-12-26 LAB — LIPID PANEL
Chol/HDL Ratio: 2.4 ratio (ref 0.0–4.4)
Cholesterol, Total: 157 mg/dL (ref 100–199)
HDL: 65 mg/dL (ref >39)
LDL Chol Calc (NIH): 67 mg/dL (ref 0–99)
Triglycerides: 145 mg/dL (ref 0–149)
VLDL Cholesterol Cal: 25 mg/dL (ref 5–40)

## 2023-12-26 LAB — HEPATIC FUNCTION PANEL
ALT: 22 IU/L (ref 0–32)
AST: 31 IU/L (ref 0–40)
Albumin: 4 g/dL (ref 3.8–4.8)
Alkaline Phosphatase: 61 IU/L (ref 44–121)
Bilirubin Total: 0.2 mg/dL (ref 0.0–1.2)
Bilirubin, Direct: 0.09 mg/dL (ref 0.00–0.40)
Total Protein: 6.5 g/dL (ref 6.0–8.5)

## 2024-01-04 ENCOUNTER — Other Ambulatory Visit: Payer: Self-pay | Admitting: Student

## 2024-01-04 DIAGNOSIS — I480 Paroxysmal atrial fibrillation: Secondary | ICD-10-CM

## 2024-01-07 ENCOUNTER — Other Ambulatory Visit: Payer: Self-pay

## 2024-01-07 DIAGNOSIS — I480 Paroxysmal atrial fibrillation: Secondary | ICD-10-CM

## 2024-01-07 MED ORDER — APIXABAN 5 MG PO TABS: 5.0000 mg | ORAL_TABLET | Freq: Two times a day (BID) | ORAL | 3 refills | Status: AC

## 2024-01-07 NOTE — Telephone Encounter (Signed)
 Prescription refill request for Eliquis  received. Indication:afib Last office visit:5/25 Scr:1.32  5/25 Age: 78 Weight:61  kg  Prescription refilled

## 2024-01-15 DIAGNOSIS — N1831 Chronic kidney disease, stage 3a: Secondary | ICD-10-CM | POA: Diagnosis not present

## 2024-01-24 DIAGNOSIS — R809 Proteinuria, unspecified: Secondary | ICD-10-CM | POA: Diagnosis not present

## 2024-01-24 DIAGNOSIS — N1832 Chronic kidney disease, stage 3b: Secondary | ICD-10-CM | POA: Diagnosis not present

## 2024-01-24 DIAGNOSIS — N022 Recurrent and persistent hematuria with diffuse membranous glomerulonephritis: Secondary | ICD-10-CM | POA: Diagnosis not present

## 2024-01-25 ENCOUNTER — Ambulatory Visit (INDEPENDENT_AMBULATORY_CARE_PROVIDER_SITE_OTHER): Admitting: Otolaryngology

## 2024-01-31 ENCOUNTER — Encounter: Payer: Self-pay | Admitting: Student

## 2024-02-20 ENCOUNTER — Other Ambulatory Visit (INDEPENDENT_AMBULATORY_CARE_PROVIDER_SITE_OTHER): Payer: Self-pay | Admitting: Otolaryngology

## 2024-02-20 MED ORDER — CIPROFLOXACIN-DEXAMETHASONE 0.3-0.1 % OT SUSP: 4.0000 [drp] | Freq: Two times a day (BID) | OTIC | 8 refills | Status: AC | PRN

## 2024-02-22 ENCOUNTER — Ambulatory Visit (INDEPENDENT_AMBULATORY_CARE_PROVIDER_SITE_OTHER): Admitting: Otolaryngology

## 2024-04-04 ENCOUNTER — Ambulatory Visit (INDEPENDENT_AMBULATORY_CARE_PROVIDER_SITE_OTHER): Admitting: Otolaryngology

## 2024-05-16 ENCOUNTER — Ambulatory Visit (INDEPENDENT_AMBULATORY_CARE_PROVIDER_SITE_OTHER): Admitting: Otolaryngology

## 2024-05-16 ENCOUNTER — Encounter (INDEPENDENT_AMBULATORY_CARE_PROVIDER_SITE_OTHER): Payer: Self-pay | Admitting: Otolaryngology

## 2024-05-16 VITALS — BP 135/65 | HR 86 | Ht 59.0 in | Wt 132.0 lb

## 2024-05-16 DIAGNOSIS — H6122 Impacted cerumen, left ear: Secondary | ICD-10-CM | POA: Diagnosis not present

## 2024-05-16 DIAGNOSIS — H6692 Otitis media, unspecified, left ear: Secondary | ICD-10-CM

## 2024-05-16 DIAGNOSIS — H7292 Unspecified perforation of tympanic membrane, left ear: Secondary | ICD-10-CM

## 2024-05-16 DIAGNOSIS — H903 Sensorineural hearing loss, bilateral: Secondary | ICD-10-CM

## 2024-05-16 DIAGNOSIS — H7202 Central perforation of tympanic membrane, left ear: Secondary | ICD-10-CM

## 2024-05-17 NOTE — Progress Notes (Signed)
 Patient ID: Rita Myers, female   DOB: May 24, 1945, 79 y.o.   MRN: [id number]  Follow up: Left tympanic membrane perforation, chronic left ear infection, bilateral hearing loss  History of Present Illness Rita Myers is an 79 year old female with chronic left ear disease who presents for follow-up of persistent left ear pruritus, cerumen impaction, and tympanic membrane perforation.  She reports persistent left ear pruritus, described as constant and bothersome. She also notes intermittent tinnitus in the left ear and a sensation of something 'rolling inside' the ear. She denies current otorrhea from the left ear.  She manages her symptoms with Ciprodex  eardrops, recently increasing use to twice weekly. She applies four drops, allows them to soak for one to two minutes, and uses a cotton ball to absorb excess fluid. She is able to obtain refills as needed.  She has a history of left tympanic membrane perforation and recurrent cerumen impaction. The right ear is asymptomatic.  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  She denies any recent change in her hearing.  Exam: General: Communicates without difficulty, well nourished, no acute distress. Head: Normocephalic, no evidence injury, no tenderness, facial buttresses intact without stepoff. Eyes: PERRL, EOMI. No scleral icterus, conjunctivae clear. Neuro: CN II exam reveals vision grossly intact. No nystagmus at any point of gaze. Ear: Monomeric right TM.  Left ear cerumen impaction.  Nose: External evaluation reveals normal support and skin without lesions. Dorsum is intact. Anterior rhinoscopy reveals healthy pink mucosa over anterior aspect of inferior turbinates and intact septum. No purulence noted. Oral:  Oral cavity and oropharynx are intact, symmetric, without erythema or edema. Mucosa is moist without lesions. Neck: Full range of motion without pain. There is no significant  lymphadenopathy. No masses palpable. Thyroid  bed within normal limits to palpation. Parotid glands and submandibular glands equal bilaterally without mass. Trachea is midline. Neuro:  CN 2-12 grossly intact. Gait normal. Vestibular: No nystagmus at any point of gaze. The cerebellar examination is unremarkable.    Procedure: Left ear cerumen disimpaction Anesthesia: None Description: Under the operating microscope, the cerumen is carefully removed with a combination of cerumen currette, alligator forceps, and suction catheters.  After the cerumen is removed, a small left tympanic membrane perforation is noted, with surrounding polypoid tissue.  No mass, erythema, or lesions. The patient tolerated the procedure well.   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 The left ear is prone to cerumen accumulation and persistent pruritus due to chronic inflammation. - Otomicroscopy with left ear cerumen disimpaction. - Recommended continued Ciprodex  otic drops, increasing frequency to twice weekly (e.g., Monday and Friday), 4 drops per dose, allowing to soak for 1-2 minutes, then using a cotton ball to absorb excess. - Advised follow-up every six months for aural toilet and monitoring. - Instructed her to obtain Ciprodex  refills from the pharmacy as needed.

## 2024-06-06 ENCOUNTER — Ambulatory Visit: Payer: Self-pay | Admitting: Student

## 2024-06-06 ENCOUNTER — Other Ambulatory Visit: Payer: Self-pay

## 2024-06-06 ENCOUNTER — Encounter: Payer: Self-pay | Admitting: Student

## 2024-06-06 VITALS — BP 139/61 | HR 70 | Temp 97.8°F | Ht 59.0 in | Wt 133.0 lb

## 2024-06-06 DIAGNOSIS — I110 Hypertensive heart disease with heart failure: Secondary | ICD-10-CM

## 2024-06-06 DIAGNOSIS — F419 Anxiety disorder, unspecified: Secondary | ICD-10-CM | POA: Diagnosis not present

## 2024-06-06 DIAGNOSIS — N1831 Chronic kidney disease, stage 3a: Secondary | ICD-10-CM | POA: Diagnosis not present

## 2024-06-06 DIAGNOSIS — I5020 Unspecified systolic (congestive) heart failure: Secondary | ICD-10-CM | POA: Diagnosis not present

## 2024-06-06 DIAGNOSIS — E1122 Type 2 diabetes mellitus with diabetic chronic kidney disease: Secondary | ICD-10-CM

## 2024-06-06 DIAGNOSIS — I480 Paroxysmal atrial fibrillation: Secondary | ICD-10-CM | POA: Diagnosis not present

## 2024-06-06 DIAGNOSIS — I10 Essential (primary) hypertension: Secondary | ICD-10-CM

## 2024-06-06 DIAGNOSIS — N042 Nephrotic syndrome with diffuse membranous glomerulonephritis, unspecified: Secondary | ICD-10-CM

## 2024-06-06 DIAGNOSIS — N045 Nephrotic syndrome with diffuse mesangiocapillary glomerulonephritis: Secondary | ICD-10-CM

## 2024-06-06 DIAGNOSIS — E039 Hypothyroidism, unspecified: Secondary | ICD-10-CM

## 2024-06-06 LAB — POCT GLYCOSYLATED HEMOGLOBIN (HGB A1C): HbA1c, POC (controlled diabetic range): 6.3 % (ref 0.0–7.0)

## 2024-06-06 LAB — GLUCOSE, CAPILLARY: Glucose-Capillary: 128 mg/dL — ABNORMAL HIGH (ref 70–99)

## 2024-06-06 NOTE — Progress Notes (Signed)
 "   Subjective:  CC: chronic condition follow up  HPI:  [name] is a 79 y.o. female with a past medical history stated below and presents today for chronic condition follow up. She has a history of membranous nephropathy s/p rituximab , on ARB and SGLT2i therapy followed by Dr. 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   She is accompanied by her daughter today. Tells me she is doing well, has moved to a mobility accessible housing with her husband. Her BP at home has been  <130/90, weight is maintained, no chest pain, shortness or breath, or peripheral edema.   Reviewed medications with patient and family present. She is enjoying her time at home, particularly movies at night. Eats diet with fruits, low salt, and high fiber. No trouble with incontinence and takes bene fiber as needed for constipation.    Please see problem based assessment and plan for additional details.  Past Medical History:  Diagnosis Date   Aortic insufficiency    Essential hypertension 07/11/2017   Hyperlipemia    Hypertension    Hypokalemia 07/11/2017   Mitral regurgitation    Nausea, vomiting, and diarrhea 07/11/2017   Paroxysmal A-fib (HCC) 07/11/2017   Pulmonary hypertension (HCC)    Syncope, vasovagal 07/11/2017    Medications Ordered Prior to Encounter[1]  Family History  Problem Relation Age of Onset   Diabetes Mother    Diabetes Sister    Diabetes Brother     Social History   Socioeconomic History   Marital status: Married    Spouse name: Not on file   Number of children: Not on file   Years of education: Not on file   Highest education level: Not on file  Occupational History   Not on file  Tobacco Use   Smoking status: Never   Smokeless tobacco: Never  Vaping Use   Vaping status: Never Used  Substance and Sexual Activity   Alcohol  use: No   Drug use: No   Sexual activity: Not on file  Other Topics  Concern   Not on file  Social History Narrative   Not on file   Social Drivers of Health   Tobacco Use: Low Risk (06/06/2024)   Patient History    Smoking Tobacco Use: Never    Smokeless Tobacco Use: Never    Passive Exposure: Not on file  Financial Resource Strain: Low Risk (10/26/2022)   Overall Financial Resource Strain (CARDIA)    Difficulty of Paying Living Expenses: Not very hard  Food Insecurity: No Food Insecurity (10/26/2022)   Hunger Vital Sign    Worried About Running Out of Food in the Last Year: Never true    Ran Out of Food in the Last Year: Never true  Transportation Needs: No Transportation Needs (10/26/2022)   PRAPARE - Administrator, Civil Service (Medical): No    Lack of Transportation (Non-Medical): No  Physical Activity: Not on file  Stress: Stress Concern Present (10/26/2022)   [facility] [employer] - Occupational Stress Questionnaire    Feeling of Stress : To some extent  Social Connections: Not on file  Intimate Partner Violence: Not At Risk (05/19/2022)   Humiliation, Afraid, Rape, and Kick questionnaire    Fear of Current or Ex-Partner: No    Emotionally Abused: No    Physically Abused: No    Sexually Abused: No  Depression (PHQ2-9): Low Risk (06/06/2024)   Depression (  PHQ2-9)    PHQ-2 Score: 0  Alcohol  Screen: Not on file  Housing: Low Risk (10/26/2022)   Housing    Last Housing Risk Score: 0  Utilities: Not At Risk (10/26/2022)   AHC Utilities    Threatened with loss of utilities: No  Health Literacy: Not on file    Review of Systems: ROS negative except for what is noted on the assessment and plan.  Objective:   Vitals:   06/06/24 1054 06/06/24 1058 06/06/24 1136  BP: (!) 155/65 (!) 143/54 139/61  Pulse: 84 79 70  Temp: 97.8 F (36.6 C)    TempSrc: Oral    SpO2: 100%    Weight: 133 lb (60.3 kg)    Height: 4' 11 (1.499 m)      Physical Exam: Constitutional: well-appearing elderly woman sitting in chair,  in no acute distress HENT: normocephalic atraumatic, mucous membranes moist Eyes: conjunctiva non-erythematous Neck: supple Cardiovascular: RRR today Pulmonary/Chest: normal work of breathing on room air, lungs clear to auscultation bilaterally Abdominal: soft, non-tender, non-distended MSK: normal bulk and tone Neurological: alert & oriented x 3, normal gait Skin: warm and dry Psych: Pleasant mood and affect       06/06/2024   10:56 AM  Depression screen PHQ 2/9  Decreased Interest 0  Down, Depressed, Hopeless 0  PHQ - 2 Score 0       06/06/2024   10:56 AM 10/23/2022   11:19 AM  GAD 7 : Generalized Anxiety Score  Nervous, Anxious, on Edge 1 3   Control/stop worrying 1 3   Worry too much - different things 1 3   Trouble relaxing 1 3   Restless 1 0   Easily annoyed or irritable 1 0   Afraid - awful might happen 1 3   Total GAD 7 Score 7 15  Anxiety Difficulty Somewhat difficult Very difficult     Data saved with a previous flowsheet row definition     Assessment & Plan:   Heart failure with reduced ejection fraction (HCC) Stable. No evidence of exacerbation. Reviewed most recent BMP.  -Continue regimen  Paroxysmal atrial fibrillation (HCC) Continues on chronic anticoagulation. No changes  Diabetes (HCC) Well controlled. 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 Marlee. Patient is to follow up in March. Subnephrotic range proteinuria. No evidence of volume overload. - Continue monitoring  Anxiety Stable. Not impacting ADLS or iADLS. Good family support  Hypothyroidism Checked levels during this visit with TSH within normal limits. Patient can continue with current dose.   Essential hypertension Know in-office HTN. Improvement with third reading. Home readings at goal. Will continue current therapies.    Return in about 4 months (around 10/14/2024) for With PCP in June for chronic  medical conditions before the new year.  Patient discussed with Dr. CHARLENA [name], MD [facility] Res[facility]           [1]  Current Outpatient Medications on File Prior to Visit  Medication Sig Dispense Refill   apixaban  (ELIQUIS ) 5 MG TABS tablet Take 1 tablet (5 mg total) by mouth 2 (two) times daily. 120 tablet 3   ciprofloxacin -dexamethasone  (CIPRODEX ) OTIC suspension Place 4 drops into the left ear 2 (two) times daily as needed (ear infection). 7.5 mL 8   fluticasone  (FLONASE ) 50 MCG/ACT nasal spray SHAKE LIQUID AND USE 2 SPRAYS IN EACH NOSTRIL DAILY 16 g 3   levothyroxine  (SYNTHROID ) 25 MCG  tablet TAKE 1 TABLET(25 MCG) BY MOUTH EVERY MORNING 90 tablet 3   loratadine  (CLARITIN ) 10 MG tablet Take 10 mg by mouth daily.     losartan  (COZAAR ) 100 MG tablet TAKE 1 TABLET(100 MG) BY MOUTH DAILY 90 tablet 2   metoprolol  succinate (TOPROL -XL) 25 MG 24 hr tablet TAKE 1 TABLET(25 MG) BY MOUTH DAILY 90 tablet 3   potassium chloride  SA (KLOR-CON  M) 20 MEQ tablet TAKE 1 TABLET(20 MEQ) BY MOUTH DAILY 90 tablet 3   rosuvastatin  (CRESTOR ) 40 MG tablet Take 1 tablet (40 mg total) by mouth daily. 90 tablet 3   torsemide  60 MG TABS Take 60 mg by mouth daily.     No current facility-administered medications on file prior to visit.   "

## 2024-06-06 NOTE — Patient Instructions (Addendum)
 Thank you, Ms.Leenah Guion for allowing us  to provide your care today. Today we discussed   You chronic medical conditions and the progress you have made with good care and following up with your specialist.    Please follow up with Dr. Marlee in March and let us  know if there are any abnormalities or changes to your medications  I am checking your thyroid  labs below to see if there are adjustments needed to your current medications.  Continue to eat well, taking benefiber for ease in bowel movements, choose low salt and low sugar containing foods.    It was a pleasure seeing you today!  I have ordered the following labs for you:   Lab Orders         Glucose, capillary         TSH         POC Hbg A1C      I will call if any are abnormal and we need to make any changes  Please follow-up in: June with me and sooner with any of my colleagues should you need medical attention    We look forward to seeing you next time. Please call our clinic at [phone] if you have any questions or concerns. The best time to call is Monday-Friday from 9am-4pm, but there is someone available 24/7. If after hours or the weekend, call the main hospital number and ask for the Internal Medicine Resident On-Call. If you need medication refills, please notify your pharmacy one week in advance and they will send us  a request.   Thank you for letting us  take part in your care. Wishing you the best!  Elnora Ip, MD 06/06/2024, 11:38 AM [facility]

## 2024-06-07 LAB — TSH: TSH: 2.94 u[IU]/mL (ref 0.450–4.500)

## 2024-06-08 ENCOUNTER — Encounter: Payer: Self-pay | Admitting: Student

## 2024-06-08 ENCOUNTER — Ambulatory Visit: Payer: Self-pay | Admitting: Student

## 2024-06-08 NOTE — Assessment & Plan Note (Signed)
 Know in-office HTN. Improvement with third reading. Home readings at goal. Will continue current therapies.

## 2024-06-08 NOTE — Assessment & Plan Note (Addendum)
 Checked levels during this visit with TSH within normal limits. Patient can continue with current dose.

## 2024-06-08 NOTE — Assessment & Plan Note (Signed)
 Stable. No evidence of exacerbation. Reviewed most recent BMP.  -Continue regimen

## 2024-06-08 NOTE — Assessment & Plan Note (Signed)
 Continues on chronic anticoagulation. No changes

## 2024-06-08 NOTE — Assessment & Plan Note (Signed)
 Well controlled. No changes.

## 2024-06-08 NOTE — Assessment & Plan Note (Signed)
 Reviewed recent notes from Dr. Marlee. Patient is to follow up in March. Subnephrotic range proteinuria. No evidence of volume overload. - Continue monitoring

## 2024-06-08 NOTE — Assessment & Plan Note (Signed)
 Stable. Not impacting ADLS or iADLS. Good family support

## 2024-06-11 ENCOUNTER — Other Ambulatory Visit: Payer: Self-pay | Admitting: Cardiology

## 2024-06-11 DIAGNOSIS — N042 Nephrotic syndrome with diffuse membranous glomerulonephritis, unspecified: Secondary | ICD-10-CM

## 2024-06-11 DIAGNOSIS — I10 Essential (primary) hypertension: Secondary | ICD-10-CM

## 2024-06-12 ENCOUNTER — Encounter: Payer: Self-pay | Admitting: Cardiology

## 2024-06-13 MED ORDER — LOSARTAN POTASSIUM 100 MG PO TABS: 100.0000 mg | ORAL_TABLET | Freq: Every day | ORAL | 1 refills | Status: AC

## 2024-06-14 NOTE — Progress Notes (Signed)
 Internal Medicine Clinic Attending  Case discussed with the resident at the time of the visit.  We reviewed the resident's history and exam and pertinent patient test results.  I agree with the assessment, diagnosis, and plan of care documented in the resident's note.

## 2024-10-17 ENCOUNTER — Ambulatory Visit: Payer: Self-pay | Admitting: Student

## 2024-11-21 ENCOUNTER — Ambulatory Visit (INDEPENDENT_AMBULATORY_CARE_PROVIDER_SITE_OTHER): Admitting: Otolaryngology
